# Patient Record
Sex: Female | Born: 1945
Health system: Southern US, Community
[De-identification: ages and names within clinical notes are randomized; demographics above are authoritative.]

## PROBLEM LIST (undated history)

## (undated) DIAGNOSIS — I10 Essential (primary) hypertension: Secondary | ICD-10-CM

## (undated) DIAGNOSIS — L98499 Non-pressure chronic ulcer of skin of other sites with unspecified severity: Secondary | ICD-10-CM

## (undated) DIAGNOSIS — K219 Gastro-esophageal reflux disease without esophagitis: Secondary | ICD-10-CM

## (undated) DIAGNOSIS — Z8249 Family history of ischemic heart disease and other diseases of the circulatory system: Secondary | ICD-10-CM

## (undated) DIAGNOSIS — Z8489 Family history of other specified conditions: Secondary | ICD-10-CM

## (undated) DIAGNOSIS — IMO0001 Reserved for inherently not codable concepts without codable children: Secondary | ICD-10-CM

## (undated) DIAGNOSIS — A4902 Methicillin resistant Staphylococcus aureus infection, unspecified site: Secondary | ICD-10-CM

## (undated) DIAGNOSIS — E785 Hyperlipidemia, unspecified: Secondary | ICD-10-CM

## (undated) DIAGNOSIS — M069 Rheumatoid arthritis, unspecified: Secondary | ICD-10-CM

## (undated) HISTORY — DX: Gastro-esophageal reflux disease without esophagitis: K21.9

## (undated) HISTORY — DX: Non-pressure chronic ulcer of skin of other sites with unspecified severity: L98.499

## (undated) HISTORY — DX: Rheumatoid arthritis, unspecified: M06.9

## (undated) HISTORY — DX: Essential (primary) hypertension: I10

## (undated) HISTORY — DX: Methicillin resistant Staphylococcus aureus infection, unspecified site: A49.02

## (undated) HISTORY — DX: Hyperlipidemia, unspecified: E78.5

## (undated) HISTORY — DX: Reserved for inherently not codable concepts without codable children: IMO0001

## (undated) HISTORY — PX: TUBAL LIGATION: SHX77

---

## 1999-12-31 HISTORY — PX: OTHER SURGICAL HISTORY: SHX169

## 2005-10-11 ENCOUNTER — Ambulatory Visit: Payer: Self-pay | Admitting: Internal Medicine

## 2005-11-12 ENCOUNTER — Ambulatory Visit: Payer: Self-pay | Admitting: Internal Medicine

## 2006-05-07 ENCOUNTER — Ambulatory Visit: Payer: Self-pay | Admitting: Pediatrics

## 2006-09-18 ENCOUNTER — Ambulatory Visit: Payer: Self-pay | Admitting: Unknown Physician Specialty

## 2007-07-02 ENCOUNTER — Ambulatory Visit: Payer: Self-pay | Admitting: Internal Medicine

## 2007-07-07 ENCOUNTER — Ambulatory Visit: Payer: Self-pay | Admitting: Internal Medicine

## 2007-10-08 ENCOUNTER — Ambulatory Visit: Payer: Self-pay | Admitting: Unknown Physician Specialty

## 2007-12-15 ENCOUNTER — Ambulatory Visit: Payer: Self-pay | Admitting: Unknown Physician Specialty

## 2008-07-13 ENCOUNTER — Ambulatory Visit: Payer: Self-pay | Admitting: Internal Medicine

## 2008-08-25 ENCOUNTER — Ambulatory Visit: Payer: Self-pay | Admitting: Internal Medicine

## 2008-12-30 HISTORY — PX: CHOLECYSTECTOMY: SHX55

## 2009-06-30 ENCOUNTER — Ambulatory Visit: Payer: Self-pay | Admitting: Internal Medicine

## 2009-07-27 ENCOUNTER — Ambulatory Visit: Payer: Self-pay | Admitting: Surgery

## 2009-08-01 ENCOUNTER — Ambulatory Visit: Payer: Self-pay | Admitting: Internal Medicine

## 2009-08-03 ENCOUNTER — Ambulatory Visit: Payer: Self-pay | Admitting: Surgery

## 2009-08-31 ENCOUNTER — Ambulatory Visit: Payer: Self-pay | Admitting: Internal Medicine

## 2010-07-09 ENCOUNTER — Ambulatory Visit: Payer: Self-pay | Admitting: Rheumatology

## 2010-11-13 ENCOUNTER — Ambulatory Visit: Payer: Self-pay | Admitting: Internal Medicine

## 2011-02-12 ENCOUNTER — Ambulatory Visit: Payer: Self-pay | Admitting: Internal Medicine

## 2012-03-12 ENCOUNTER — Ambulatory Visit: Payer: Self-pay | Admitting: Internal Medicine

## 2012-07-07 ENCOUNTER — Ambulatory Visit: Payer: Self-pay | Admitting: Internal Medicine

## 2012-10-06 ENCOUNTER — Telehealth: Payer: Self-pay | Admitting: Internal Medicine

## 2012-10-06 ENCOUNTER — Emergency Department: Payer: Self-pay | Admitting: Emergency Medicine

## 2012-10-06 NOTE — Telephone Encounter (Signed)
I can see her at 11:30 Friday for er follow up.  Have her bring in her meds with her.  Also would like to have er records prior to her visit.  thanks

## 2012-10-06 NOTE — Telephone Encounter (Signed)
Caller: [name]/Patient; Patient Name: Erica Little; PCP: [facility] [place]; Best Callback Phone Number: [phone]  Patient states she developed pain, redness and tenderness in calf of right leg. Onset 10/04/12. Afebrile. States pain eased 10/05/12 a.m. but increased 10/05/12 p.m. States calf is swollen and feels "tight." States area is "hot" to touch.  States reddened area is approximately 5 inches in length. Patient describes positive Homan's sign. States pain increases with standing. Triage per Leg Non-Injury Protocol. Care advice given per guidelines related to positive triage assessment for " Recent onset of one-sided pain, tenderness, or aching that may worsen with standing or walking." Patient advised to be evaluated in the Emergency Department now. Patient advised to elevate leg. Advised not to drive self. Patient verbalizes understanding and agreeable. States will go to [facility] for evaluation.

## 2012-10-06 NOTE — Telephone Encounter (Signed)
Patient did state that Triage couldn't find her in our system. I have added her to our system.

## 2012-10-06 NOTE — Telephone Encounter (Signed)
Patient spoke with our triage and was advised to go to ER, which patient did and they would like you to see her Thursday or Friday they dx her with cellulitis. Please advise.

## 2012-10-06 NOTE — Telephone Encounter (Signed)
Agree with evaluation now.   

## 2012-10-07 NOTE — Telephone Encounter (Signed)
Appointment 10/11 @ 11:30 pt aware of appointment

## 2012-10-09 ENCOUNTER — Encounter: Payer: Self-pay | Admitting: Internal Medicine

## 2012-10-09 ENCOUNTER — Ambulatory Visit (INDEPENDENT_AMBULATORY_CARE_PROVIDER_SITE_OTHER): Payer: No Typology Code available for payment source | Admitting: Internal Medicine

## 2012-10-09 VITALS — BP 142/80 | HR 77 | Temp 98.3°F | Ht 61.5 in | Wt 118.0 lb

## 2012-10-09 DIAGNOSIS — E7800 Pure hypercholesterolemia, unspecified: Secondary | ICD-10-CM

## 2012-10-09 DIAGNOSIS — I10 Essential (primary) hypertension: Secondary | ICD-10-CM

## 2012-10-09 DIAGNOSIS — E871 Hypo-osmolality and hyponatremia: Secondary | ICD-10-CM

## 2012-10-09 DIAGNOSIS — L0291 Cutaneous abscess, unspecified: Secondary | ICD-10-CM

## 2012-10-09 DIAGNOSIS — L0390 Cellulitis, unspecified: Secondary | ICD-10-CM

## 2012-10-09 NOTE — Patient Instructions (Signed)
It was nice seeing you today.  I am sorry you have had problems with your leg.  Since it is so much better, I want you to continue taking the antibiotics as you have been doing.  Let me know if any problems or if the leg infection does not completely resolve.

## 2012-10-11 ENCOUNTER — Encounter: Payer: Self-pay | Admitting: Internal Medicine

## 2012-10-11 DIAGNOSIS — E7800 Pure hypercholesterolemia, unspecified: Secondary | ICD-10-CM | POA: Insufficient documentation

## 2012-10-11 DIAGNOSIS — E871 Hypo-osmolality and hyponatremia: Secondary | ICD-10-CM | POA: Insufficient documentation

## 2012-10-11 DIAGNOSIS — I10 Essential (primary) hypertension: Secondary | ICD-10-CM | POA: Insufficient documentation

## 2012-10-11 NOTE — Assessment & Plan Note (Addendum)
Sodium has been wnl on most recent checks.  Follow.

## 2012-10-11 NOTE — Assessment & Plan Note (Signed)
Has been under good control.  Is a little elevated today.  Follow.

## 2012-10-11 NOTE — Progress Notes (Signed)
  Subjective:    Patient ID: Erica Little, female    DOB: [birth year], 65 y.o.   MRN: [id number]  HPI 66 year old female with past history of rheumatoid arthritis (on Orencia), hypertension and hypercholesterolemia.  She comes in today as an ER follow up.  Developed right knee pain and swelling initially (five days ago).  This progressed to involve more of her lower leg.  Increased pain and erythema - down her right leg.  She went to the ER 10/06/12.  Ultrasound of her right lower leg - negative for DVT.  Was diagnosed with cellulitis.  Placed on Keflex.  Leg is improved.  Pain and redness much better.    Past Medical History  Diagnosis Date  . Rheumatoid arthritis   . Ulcer disease   . Hypertension   . Hyperlipidemia     Review of Systems Patient denies any headache, lightheadedness or dizziness.  No fever or chills.  No chest pain, tightness or palpatations.  No increased shortness of breath, cough or congestion.  No nausea or vomiting.  No abdominal pain or cramping.  No bowel change, such as diarrhea, constipation, BRBPR or melana.  No urine change.      Objective:   Physical Exam Filed Vitals:   10/09/12 1128  BP: 142/80  Pulse: 77  Temp: 98.3 F (52.47 C)   66 year old female in no acute distress.   HEENT:  Nares - clear.  OP- without lesions or erythema.  NECK:  Supple, nontender.   HEART:  Appears to be regular. LUNGS:  Without crackles or wheezing audible.  Respirations even and unlabored.  ABDOMEN:  Soft, nontender.  No audible abdominal bruit.   EXTREMITIES:  No significant erythema.  Minimal tenderness just below the right knee.  Minimal soft tissue swelling just below the knee - no significant swelling.  (Per pt - much improved).                   Assessment & Plan:  CELLULITIS.  Improved on Keflex.  Complete course of Keflex.  Follow.   LYMPHADENOPATHY.  There were several hypoechoic masses noted on ultrasound.  (Felt to be consistent with lymph nodes).  Has the  infection as outlined.  Minimal inguinal lymphadenopathy noted on today's exam.  Follow.  Should resolve with treating the above infection.

## 2012-10-11 NOTE — Assessment & Plan Note (Signed)
Low cholesterol diet and exercise.  Follow lipid profile.    

## 2012-10-28 ENCOUNTER — Telehealth: Payer: Self-pay | Admitting: Internal Medicine

## 2012-10-28 NOTE — Telephone Encounter (Signed)
See if she can come in tomorrow at 10:00 - work in for this problem.  Thanks.

## 2012-10-28 NOTE — Telephone Encounter (Signed)
Caller: [name]/Patient; Patient Name: Erica Little; PCP: Dale [place]; Best Callback Phone Number: [phone].  Patient calling about cough.  Onset 10/23/12.  States has been using her inhaler, but states she has not been sleeping well.  Cough is not productive.  Afebrile.  Per cough protocol, emergent symptoms denied; advised appt within 24 hours.  Per Epic, no appts available within dispositioned time frame; info to office for staff management/workin appt.   May reach patient at [phone].

## 2012-10-29 ENCOUNTER — Ambulatory Visit (INDEPENDENT_AMBULATORY_CARE_PROVIDER_SITE_OTHER): Payer: No Typology Code available for payment source | Admitting: Internal Medicine

## 2012-10-29 ENCOUNTER — Encounter: Payer: Self-pay | Admitting: Internal Medicine

## 2012-10-29 VITALS — BP 167/81 | HR 80 | Temp 98.9°F | Ht 60.0 in | Wt 116.8 lb

## 2012-10-29 DIAGNOSIS — J069 Acute upper respiratory infection, unspecified: Secondary | ICD-10-CM

## 2012-10-29 DIAGNOSIS — I10 Essential (primary) hypertension: Secondary | ICD-10-CM

## 2012-10-29 MED ORDER — AZITHROMYCIN 250 MG PO TABS: ORAL_TABLET | ORAL | Status: DC

## 2012-10-29 MED ORDER — FLUTICASONE PROPIONATE 50 MCG/ACT NA SUSP: 2.0000 | Freq: Every day | NASAL | Status: DC

## 2012-10-29 MED ORDER — AMLODIPINE BESYLATE 2.5 MG PO TABS: 2.5000 mg | ORAL_TABLET | Freq: Every day | ORAL | Status: DC

## 2012-10-29 MED ORDER — PREDNISONE 10 MG PO TABS: 10.0000 mg | ORAL_TABLET | Freq: Every day | ORAL | Status: DC

## 2012-10-29 NOTE — Patient Instructions (Signed)
It was good to see you today.  I am sorry you are not feeling well.  I am going to give you an antibiotic (Zpak) and prednisone to take as directed.  Let me know if your symptoms do not improve/resolve.  Use saline nasal spray - flush nose at least 2-3x/day and Flonase nasal spray - 2 sprays each nostril one time per day.

## 2012-10-29 NOTE — Telephone Encounter (Signed)
Pt aware of appointment 

## 2012-10-30 ENCOUNTER — Telehealth: Payer: Self-pay | Admitting: Internal Medicine

## 2012-10-30 NOTE — Telephone Encounter (Signed)
With her being sick, I expect her blood pressure to be a little elevated.  Will hold on making changes and continue treatment of her infection - as long as no new acute problems.  She can continue to spot check her blood pressure and call in.  Can check at most daily.   Any problems, let us know.

## 2012-10-30 NOTE — Telephone Encounter (Signed)
Called patient at home to inform her and patient stated that she will let us know if there are any more problems. Patient also stated that her bp has lowered.

## 2012-10-30 NOTE — Telephone Encounter (Signed)
Caller: Nyajah/Patient; Patient Name: Erica Little; PCP: Dale [place]; Best Callback Phone Number: [phone]. Patient states she was seen in the office yesterday 10/29/12 and given Prednison and antibiotic.  Patient reports she was instructed by  Dr Lorin Picket to call back today and report her BP readings.. Patient states BP today 10/30/12 at 10:13 AM 177/82, HR 74;  10:48 AM 163/73, HR 71 and 11:15 AM 165/71, HR 73.  PLEASE F/U WITH PATIENT IF NEEDED MEDICATION CHANGES.  THANK YOU.

## 2012-10-31 NOTE — Assessment & Plan Note (Signed)
Blood pressure elevated.  Have her spot check her pressure and send in readings.  Make adjustments if persistent elevation.  Treat infection.  Should improve back to baseline.

## 2012-10-31 NOTE — Progress Notes (Signed)
  Subjective:    Patient ID: Erica Little, female    DOB: 1946/09/01, 66 y.o.   MRN: [id number]  HPI 66 year old female with past history of hypertension and rheumatoid arthritis who comes in today as a work in with concerns regarding increased congestion and cough.  States symptoms started approximately one week ago.  No sinus congestion and nasal congestion.  Increased throat congestion.  Increased cough and wheezing.  Cough occasionally productive.  No sob.  No vomiting.  No bowel change.  Used the Advair x 1.  Though it made her symptoms worse.  Past Medical History  Diagnosis Date  . Rheumatoid arthritis   . Ulcer disease   . Hypertension   . Hyperlipidemia     Review of Systems Patient denies any headache, lightheadedness or dizziness.  No chest pain, tightness or palpitations.  Does have the increased cough and congestion as outlined.   No nausea or vomiting.  No abdominal pain or cramping.  No bowel change, such as diarrhea, constipation, BRBPR or melana.  No urine change.        Objective:   Physical Exam Filed Vitals:   10/29/12 0958  BP: 167/81  Pulse: 80  Temp: 98.9 F (38.66 C)   66 year old female in no acute distress.   HEENT:  Nares - clear.  OP- without lesions or erythema.  TMs visualized without erythema.  No sinus tenderness to palpation.  NECK:  Supple, nontender.   HEART:  Appears to be regular. LUNGS:  Without crackles or wheezing audible.  Respirations even and unlabored.  Increased cough with forced expiration.                     Assessment & Plan:  URI.  Treat with Azithromycin as directed.  Prednisone taper as outlined starting at 40mg  and decreasing by 5mg  each day until off.  Continue Flonase nasal spray as outlined. Saline nasal spray as directed.  Notify me or be reevaluated if symptoms change, worsen or do not resolve.

## 2012-11-04 ENCOUNTER — Telehealth: Payer: Self-pay | Admitting: *Deleted

## 2012-11-11 NOTE — Telephone Encounter (Signed)
Script for Losartan sent to pharmacy

## 2012-12-17 ENCOUNTER — Encounter: Payer: Self-pay | Admitting: Internal Medicine

## 2012-12-17 ENCOUNTER — Ambulatory Visit: Payer: Self-pay | Admitting: Internal Medicine

## 2012-12-17 DIAGNOSIS — M069 Rheumatoid arthritis, unspecified: Secondary | ICD-10-CM

## 2013-01-06 ENCOUNTER — Ambulatory Visit (INDEPENDENT_AMBULATORY_CARE_PROVIDER_SITE_OTHER): Payer: Medicare Other | Admitting: Internal Medicine

## 2013-01-06 ENCOUNTER — Other Ambulatory Visit (HOSPITAL_COMMUNITY)
Admission: RE | Admit: 2013-01-06 | Discharge: 2013-01-06 | Disposition: A | Discharge status: Home / Self Care | Payer: Medicare Other | Source: Ambulatory Visit | Attending: Internal Medicine | Admitting: Internal Medicine

## 2013-01-06 ENCOUNTER — Encounter: Payer: Self-pay | Admitting: Internal Medicine

## 2013-01-06 VITALS — BP 112/78 | HR 75 | Temp 98.4°F | Ht 61.5 in

## 2013-01-06 DIAGNOSIS — I10 Essential (primary) hypertension: Secondary | ICD-10-CM

## 2013-01-06 DIAGNOSIS — Z01419 Encounter for gynecological examination (general) (routine) without abnormal findings: Secondary | ICD-10-CM | POA: Insufficient documentation

## 2013-01-06 DIAGNOSIS — M8580 Other specified disorders of bone density and structure, unspecified site: Secondary | ICD-10-CM

## 2013-01-06 DIAGNOSIS — M069 Rheumatoid arthritis, unspecified: Secondary | ICD-10-CM

## 2013-01-06 DIAGNOSIS — E7800 Pure hypercholesterolemia, unspecified: Secondary | ICD-10-CM

## 2013-01-06 DIAGNOSIS — Z1151 Encounter for screening for human papillomavirus (HPV): Secondary | ICD-10-CM | POA: Insufficient documentation

## 2013-01-06 DIAGNOSIS — R5383 Other fatigue: Secondary | ICD-10-CM

## 2013-01-06 DIAGNOSIS — R0989 Other specified symptoms and signs involving the circulatory and respiratory systems: Secondary | ICD-10-CM

## 2013-01-06 DIAGNOSIS — E871 Hypo-osmolality and hyponatremia: Secondary | ICD-10-CM

## 2013-01-06 DIAGNOSIS — Z139 Encounter for screening, unspecified: Secondary | ICD-10-CM

## 2013-01-06 DIAGNOSIS — R5381 Other malaise: Secondary | ICD-10-CM

## 2013-01-06 DIAGNOSIS — M899 Disorder of bone, unspecified: Secondary | ICD-10-CM

## 2013-01-06 DIAGNOSIS — Z01818 Encounter for other preprocedural examination: Secondary | ICD-10-CM

## 2013-01-11 ENCOUNTER — Encounter: Payer: Self-pay | Admitting: Internal Medicine

## 2013-01-11 DIAGNOSIS — M8580 Other specified disorders of bone density and structure, unspecified site: Secondary | ICD-10-CM | POA: Insufficient documentation

## 2013-01-11 NOTE — Assessment & Plan Note (Signed)
Recheck electrolytes to confirm stable.   

## 2013-01-11 NOTE — Assessment & Plan Note (Signed)
Followed by Dr Kernodle.  Stable.  

## 2013-01-11 NOTE — Assessment & Plan Note (Signed)
On pravastatin.  Low cholesterol diet.  Check lipid panel and liver function.   

## 2013-01-11 NOTE — Assessment & Plan Note (Signed)
See above for bone density.  Continue calcium and vitamin D.

## 2013-01-11 NOTE — Progress Notes (Signed)
Subjective:    Patient ID: Erica Little, female    DOB: [birth year], 67 y.o.   MRN: [id number]  HPI 67 year old female with past history of rheumatoid arthritis (on Orencia), hypertension and hypercholesterolemia.  She comes in today to follow up on these issues as well as for a complete physical exam.  States she is doing well.  Denies any chest pain, tightness or sob.  She is to be evaluated at [facility] and is planning for surgery 01/29/13.  No cough and congestion.  Eating and drinking well.  Bowels stable.  Still seeing Dr Gavin Potters for her arthritis.   Past Medical History  Diagnosis Date  . Rheumatoid arthritis     transaminitis on MTX, remicade rxn, s/p sulfasalazine, orencia  . Ulcer disease   . Hypertension   . Hyperlipidemia   . MRSA (methicillin resistant Staphylococcus aureus)     skin infections  . GERD (gastroesophageal reflux disease)     Current Outpatient Prescriptions on File Prior to Visit  Medication Sig Dispense Refill  . abatacept (ORENCIA) 250 MG injection Once every month      . amLODipine (NORVASC) 2.5 MG tablet Take 1 tablet (2.5 mg total) by mouth daily.  30 tablet  12  . calcium gluconate 650 MG tablet Take 650 mg by mouth daily.      . cholecalciferol (VITAMIN D) 1000 UNITS tablet Take 1,000 Units by mouth daily.      Marland Kitchen etodolac (LODINE) 400 MG tablet Take 400 mg by mouth 2 (two) times daily.      . fluticasone (FLONASE) 50 MCG/ACT nasal spray Place 2 sprays into the nose daily.  16 g  2  . losartan (COZAAR) 50 MG tablet Take 50 mg by mouth daily.      . Multiple Vitamin (MULTIVITAMIN) tablet Take 1 tablet by mouth daily.      Marland Kitchen omeprazole (PRILOSEC) 20 MG capsule Take 20 mg by mouth daily.      . predniSONE (DELTASONE) 10 MG tablet Take 1 tablet (10 mg total) by mouth daily. Take 4 pills x one day and then decrease by 1/2 tablet per day until down to zero mg.  18 tablet  0    Review of Systems Patient denies any headache, lightheadedness or  dizziness.  No fever or chills.  No chest pain, tightness or palpitations.  No increased shortness of breath, cough or congestion.  No nausea or vomiting.  No abdominal pain or cramping.  No bowel change, such as diarrhea, constipation, BRBPR or melana.  No urine change.  Overall she feels she is doing well.      Objective:   Physical Exam  Filed Vitals:   01/06/13 1028  BP: 112/78  Pulse: 75  Temp: 98.4 F (28.72 C)   67 year old female in no acute distress.   HEENT:  Nares- clear.  Oropharynx - without lesions. NECK:  Supple.  Nontender.  Audible carotid bruit.  HEART:  Appears to be regular. LUNGS:  No crackles or wheezing audible.  Respirations even and unlabored.  RADIAL PULSE:  Equal bilaterally.    BREASTS:  No nipple discharge or nipple retraction present.  Could not appreciate any distinct nodules or axillary adenopathy.  ABDOMEN:  Soft, nontender.  Bowel sounds present and normal.  No audible abdominal bruit.  GU:  Normal external genitalia.  Vaginal vault without lesions.  Cervix identified.  No lesions.  Could not appreciate any adnexal masses or tenderness.   RECTAL:  Heme negative.   EXTREMITIES:  No increased edema present.  DP pulses palpable and equal bilaterally.  No inguinal lymphadenopathy.         Assessment & Plan:  PRE OP.  Planning for pre op 01/21/13.  EKG obtained today and revealed sinus rhythm with no acute ischemic changes.  She is currently asymptomatic.  Denies any chest pain or tightness.  No sob.  Breathing stable.  No cough or congestion.  Recommend close intra op and post op monitoring of heart rate and blood pressure to avoid extremes.  Feel she is at low risk from a cardiac standpoint to proceed with surgery.    LYMPHADENOPATHY.  There were several hypoechoic masses noted on ultrasound.  (Felt to be consistent with lymph nodes).  Had the infection as outlined in previous note.  Minimal inguinal lymphadenopathy noted previously, but cannot appreciate on  exam today.  Follow.  Have ask for review of the ultrasound to determine if any further w/up warranted.   CAROTID BRUIT.  Check carotid ultrasound to confirm no significant stenosis.   INCREASED PSYCHOSOCIAL STRESSORS.  Feels she is doing better.  Eating well.  Follow.    PREVIOUS WEIGHT LOSS.  Weight has leveled off.  116 last visit.  117 today.  Follow.   HEALTH MAINTENANCE.  Physical today.  Bone density 10//12/11 revealed osteopenia.  colonoscopy 09/18/06.  Was due 9/12.  Discussed with her today regarding need for follow up colonoscopy.  She will consider this after her surgery.  Mammogram 03/12/12 - BiRADS I.   IFOB - given today.

## 2013-01-11 NOTE — Assessment & Plan Note (Signed)
Blood pressure under good control.  Will need close intra op and post op monitoring of her heart rate and blood pressure to avoid extremes.  Same medication regimen.  Check metabolic panel.

## 2013-01-13 ENCOUNTER — Telehealth: Payer: Self-pay | Admitting: Internal Medicine

## 2013-01-13 ENCOUNTER — Other Ambulatory Visit: Payer: Medicare Other

## 2013-01-13 DIAGNOSIS — IMO0002 Reserved for concepts with insufficient information to code with codable children: Secondary | ICD-10-CM

## 2013-01-13 NOTE — Telephone Encounter (Signed)
Pt notified of abnormal pap.  Referred to gyn for eval.

## 2013-01-14 ENCOUNTER — Other Ambulatory Visit (INDEPENDENT_AMBULATORY_CARE_PROVIDER_SITE_OTHER): Payer: Medicare Other

## 2013-01-14 DIAGNOSIS — R5383 Other fatigue: Secondary | ICD-10-CM

## 2013-01-14 DIAGNOSIS — I10 Essential (primary) hypertension: Secondary | ICD-10-CM

## 2013-01-14 DIAGNOSIS — E7800 Pure hypercholesterolemia, unspecified: Secondary | ICD-10-CM

## 2013-01-14 DIAGNOSIS — M069 Rheumatoid arthritis, unspecified: Secondary | ICD-10-CM

## 2013-01-14 DIAGNOSIS — R5381 Other malaise: Secondary | ICD-10-CM

## 2013-01-14 LAB — CBC WITH DIFFERENTIAL/PLATELET
Basophils Relative: 0.6 % (ref 0.0–3.0)
Eosinophils Absolute: 0 10*3/uL (ref 0.0–0.7)
Eosinophils Relative: 0.3 % (ref 0.0–5.0)
Hemoglobin: 12.8 g/dL (ref 12.0–15.0)
MCHC: 32.4 g/dL (ref 30.0–36.0)
MCV: 86.1 fl (ref 78.0–100.0)
Monocytes Absolute: 0.5 10*3/uL (ref 0.1–1.0)
Neutro Abs: 4.2 10*3/uL (ref 1.4–7.7)
Neutrophils Relative %: 65.2 % (ref 43.0–77.0)
RBC: 4.59 Mil/uL (ref 3.87–5.11)
WBC: 6.5 10*3/uL (ref 4.5–10.5)

## 2013-01-14 LAB — BASIC METABOLIC PANEL
BUN: 21 mg/dL (ref 6–23)
Chloride: 103 mEq/L (ref 96–112)
Creatinine, Ser: 1 mg/dL (ref 0.4–1.2)
GFR: 58.59 mL/min — ABNORMAL LOW (ref >60.00)
Glucose, Bld: 89 mg/dL (ref 70–99)
Potassium: 4.5 mEq/L (ref 3.5–5.1)

## 2013-01-14 LAB — HEPATIC FUNCTION PANEL
Alkaline Phosphatase: 76 U/L (ref 39–117)
Bilirubin, Direct: 0.1 mg/dL (ref 0.0–0.3)
Total Bilirubin: 1 mg/dL (ref 0.3–1.2)

## 2013-01-14 LAB — LIPID PANEL
HDL: 41.9 mg/dL (ref >39.00)
VLDL: 37.6 mg/dL (ref 0.0–40.0)

## 2013-01-19 ENCOUNTER — Other Ambulatory Visit: Payer: Self-pay | Admitting: *Deleted

## 2013-01-19 ENCOUNTER — Telehealth: Payer: Self-pay | Admitting: *Deleted

## 2013-01-19 MED ORDER — PRAVASTATIN SODIUM 10 MG PO TABS: 10.0000 mg | ORAL_TABLET | Freq: Every day | ORAL | Status: DC

## 2013-01-19 NOTE — Telephone Encounter (Signed)
Spoke with patient regarding lab work and ordered script.

## 2013-01-20 ENCOUNTER — Telehealth: Payer: Self-pay | Admitting: Internal Medicine

## 2013-01-20 NOTE — Telephone Encounter (Signed)
I am not sure where the form is we can fax this information over once we are told where the completed form is. Thanks

## 2013-01-20 NOTE — Telephone Encounter (Signed)
I have already completed and amber faxed the information this pm.  Thanks.

## 2013-01-20 NOTE — Telephone Encounter (Signed)
Inetta Fermo calling from [facility] [phone] extension [phone] Pt has preop 1/23 morning.  Tina faxed surgical clearance form 1/21 for Dr. Lorin Picket and she needs it back for pt preop 1/23 morning.  Please fax completed form, most recent office notes, labs and EKG to [phone]

## 2013-01-26 ENCOUNTER — Other Ambulatory Visit: Payer: Self-pay | Admitting: Internal Medicine

## 2013-01-26 MED ORDER — OMEPRAZOLE 20 MG PO CPDR: 20.0000 mg | DELAYED_RELEASE_CAPSULE | Freq: Every day | ORAL | Status: DC

## 2013-01-26 NOTE — Telephone Encounter (Signed)
Sent in to pharmacy.  

## 2013-03-03 ENCOUNTER — Encounter: Payer: Self-pay | Admitting: Internal Medicine

## 2013-04-14 ENCOUNTER — Ambulatory Visit: Payer: Self-pay | Admitting: Internal Medicine

## 2013-04-19 ENCOUNTER — Other Ambulatory Visit: Payer: Self-pay | Admitting: Internal Medicine

## 2013-04-19 DIAGNOSIS — R928 Other abnormal and inconclusive findings on diagnostic imaging of breast: Secondary | ICD-10-CM

## 2013-04-22 ENCOUNTER — Ambulatory Visit
Admission: RE | Admit: 2013-04-22 | Discharge: 2013-04-22 | Disposition: A | Discharge status: Home / Self Care | Payer: Medicare Other | Source: Ambulatory Visit | Attending: Internal Medicine | Admitting: Internal Medicine

## 2013-04-22 ENCOUNTER — Other Ambulatory Visit: Payer: Self-pay | Admitting: Internal Medicine

## 2013-04-22 DIAGNOSIS — R928 Other abnormal and inconclusive findings on diagnostic imaging of breast: Secondary | ICD-10-CM

## 2013-04-23 ENCOUNTER — Encounter: Payer: Self-pay | Admitting: Internal Medicine

## 2013-04-26 ENCOUNTER — Other Ambulatory Visit: Payer: Self-pay | Admitting: *Deleted

## 2013-04-26 MED ORDER — LOSARTAN POTASSIUM 100 MG PO TABS: ORAL_TABLET | ORAL | Status: DC

## 2013-05-11 ENCOUNTER — Encounter: Payer: Self-pay | Admitting: Internal Medicine

## 2013-05-11 ENCOUNTER — Ambulatory Visit (INDEPENDENT_AMBULATORY_CARE_PROVIDER_SITE_OTHER): Payer: Medicare Other | Admitting: Internal Medicine

## 2013-05-11 VITALS — BP 122/78 | Temp 98.5°F | Resp 85 | Ht 61.5 in | Wt 115.4 lb

## 2013-05-11 DIAGNOSIS — M069 Rheumatoid arthritis, unspecified: Secondary | ICD-10-CM

## 2013-05-11 DIAGNOSIS — M8580 Other specified disorders of bone density and structure, unspecified site: Secondary | ICD-10-CM

## 2013-05-11 DIAGNOSIS — M899 Disorder of bone, unspecified: Secondary | ICD-10-CM

## 2013-05-11 DIAGNOSIS — E7800 Pure hypercholesterolemia, unspecified: Secondary | ICD-10-CM

## 2013-05-11 DIAGNOSIS — E871 Hypo-osmolality and hyponatremia: Secondary | ICD-10-CM

## 2013-05-11 DIAGNOSIS — I10 Essential (primary) hypertension: Secondary | ICD-10-CM

## 2013-05-11 LAB — LIPID PANEL
Total CHOL/HDL Ratio: 6
VLDL: 35.6 mg/dL (ref 0.0–40.0)

## 2013-05-11 LAB — CBC WITH DIFFERENTIAL/PLATELET
Basophils Absolute: 0 10*3/uL (ref 0.0–0.1)
Eosinophils Relative: 1.3 % (ref 0.0–5.0)
HCT: 37.8 % (ref 36.0–46.0)
Hemoglobin: 12.9 g/dL (ref 12.0–15.0)
Lymphocytes Relative: 25.9 % (ref 12.0–46.0)
Lymphs Abs: 1.9 10*3/uL (ref 0.7–4.0)
Monocytes Relative: 8.3 % (ref 3.0–12.0)
Neutro Abs: 4.7 10*3/uL (ref 1.4–7.7)
RBC: 4.59 Mil/uL (ref 3.87–5.11)
RDW: 15.2 % — ABNORMAL HIGH (ref 11.5–14.6)
WBC: 7.3 10*3/uL (ref 4.5–10.5)

## 2013-05-11 LAB — BASIC METABOLIC PANEL
BUN: 21 mg/dL (ref 6–23)
CO2: 23 mEq/L (ref 19–32)
Chloride: 103 mEq/L (ref 96–112)
Creatinine, Ser: 1.1 mg/dL (ref 0.4–1.2)
Glucose, Bld: 82 mg/dL (ref 70–99)
Potassium: 4.3 mEq/L (ref 3.5–5.1)

## 2013-05-11 LAB — LDL CHOLESTEROL, DIRECT: Direct LDL: 156.2 mg/dL

## 2013-05-11 LAB — HEPATIC FUNCTION PANEL
Albumin: 3.7 g/dL (ref 3.5–5.2)
Alkaline Phosphatase: 123 U/L — ABNORMAL HIGH (ref 39–117)
Bilirubin, Direct: 0.1 mg/dL (ref 0.0–0.3)
Total Bilirubin: 0.8 mg/dL (ref 0.3–1.2)

## 2013-05-11 NOTE — Progress Notes (Signed)
Subjective:    Patient ID: Erica Little, female    DOB: 1946-03-04, 67 y.o.   MRN: [id number]  HPI 67 year old female with past history of rheumatoid arthritis (on Orencia), hypertension and hypercholesterolemia.  She comes in today to follow up on these issues as well as for a pre op evaluation.  States she is doing well.  Denies any chest pain, tightness or sob.  She is being evaluated at [facility] and is planning for surgery 06/04/13.  No cough and congestion.  Eating and drinking well.  Bowels stable.  Still seeing Dr Gavin Potters for her arthritis.    Past Medical History  Diagnosis Date  . Rheumatoid arthritis     transaminitis on MTX, remicade rxn, s/p sulfasalazine, orencia  . Ulcer disease   . Hypertension   . Hyperlipidemia   . MRSA (methicillin resistant Staphylococcus aureus)     skin infections  . GERD (gastroesophageal reflux disease)     Current Outpatient Prescriptions on File Prior to Visit  Medication Sig Dispense Refill  . abatacept (ORENCIA) 250 MG injection Once every month      . amLODipine (NORVASC) 2.5 MG tablet Take 1 tablet (2.5 mg total) by mouth daily.  30 tablet  12  . calcium gluconate 650 MG tablet Take 650 mg by mouth daily.      . cholecalciferol (VITAMIN D) 1000 UNITS tablet Take 1,000 Units by mouth daily.      Marland Kitchen etodolac (LODINE) 400 MG tablet Take 400 mg by mouth 2 (two) times daily.      . fluticasone (FLONASE) 50 MCG/ACT nasal spray Place 2 sprays into the nose daily.  16 g  2  . losartan (COZAAR) 100 MG tablet TAKE 1 TABLET ONCE A DAY  30 tablet  5  . Multiple Vitamin (MULTIVITAMIN) tablet Take 1 tablet by mouth daily.      Marland Kitchen omeprazole (PRILOSEC) 20 MG capsule Take 1 capsule (20 mg total) by mouth daily.  30 capsule  5  . pravastatin (PRAVACHOL) 10 MG tablet Take 1 tablet (10 mg total) by mouth daily.  90 tablet  3   No current facility-administered medications on file prior to visit.    Review of Systems Patient denies any headache,  lightheadedness or dizziness.  No fever or chills.  No chest pain, tightness or palpitations.  No increased shortness of breath, cough or congestion.  No nausea or vomiting.  No abdominal pain or cramping.  No bowel change, such as diarrhea, constipation, BRBPR or melana.  No urine change.  Overall she feels she is doing well.   She has been off her pravastatin.  Reports dry mouth with pravastatin.  Agrees to try pravastatin 1/2 tablet q day.     Objective:   Physical Exam  Filed Vitals:   05/11/13 1010  BP: 122/78  Temp: 98.5 F (36.9 C)  Resp: 85   Pulse 34  67 year old female in no acute distress.   HEENT:  Nares- clear.  Oropharynx - without lesions. NECK:  Supple.  Nontender.  Audible carotid bruit.  HEART:  Appears to be regular. LUNGS:  No crackles or wheezing audible.  Respirations even and unlabored.  RADIAL PULSE:  Equal bilaterally.  ABDOMEN:  Soft, nontender.  Bowel sounds present and normal.  No audible abdominal bruit.  EXTREMITIES:  No increased edema present.  DP pulses palpable and equal bilaterally.  No inguinal lymphadenopathy.         Assessment & Plan:  PRE OP.   EKG performed recently and revealed sinus rhythm with no acute ischemic changes.  She is currently asymptomatic.  Denies any chest pain or tightness.  No sob.  Breathing stable.  No cough or congestion.  Recent surgery - she did well.   I feel that she is at low risk from a cardiac standpoint to proceed with surgery.  Recommend close intra op and post op monitoring of heart rate and blood pressure to avoid extremes.  Feel she is at low risk from a cardiac standpoint to proceed with surgery.    LYMPHADENOPATHY.  There were several hypoechoic masses noted on ultrasound.  (Felt to be consistent with lymph nodes).  Had the infection as outlined in previous note.  Minimal inguinal lymphadenopathy noted previously, but cannot appreciate on exam today.  Follow.  Have ask for review of the ultrasound to determine if  any further w/up warranted.   CAROTID BRUIT.  Carotid ultrasound revealed no significant stenosis.   INCREASED PSYCHOSOCIAL STRESSORS.  Feels she is doing better.  Eating well.  Follow.    PREVIOUS WEIGHT LOSS.  Weight has leveled off.  Follow.   HEALTH MAINTENANCE.  Physical last visit.  Bone density 10//12/11 revealed osteopenia.  colonoscopy 09/18/06.  Was due 9/12.  Discussed with her today regarding need for follow up colonoscopy.  She will consider this after her surgery.  Mammogram 04/22/13 - ok.

## 2013-05-13 ENCOUNTER — Encounter: Payer: Self-pay | Admitting: *Deleted

## 2013-05-14 ENCOUNTER — Encounter: Payer: Self-pay | Admitting: Internal Medicine

## 2013-05-14 NOTE — Assessment & Plan Note (Signed)
Blood pressure under good control.  Will need close intra op and post op monitoring of her heart rate and blood pressure to avoid extremes.  Same medication regimen.  Check metabolic panel.

## 2013-05-14 NOTE — Assessment & Plan Note (Signed)
See above for bone density.  Continue calcium and vitamin D.

## 2013-05-14 NOTE — Assessment & Plan Note (Signed)
Followed by Dr Kernodle.  Stable.  

## 2013-05-14 NOTE — Assessment & Plan Note (Signed)
On pravastatin.  Low cholesterol diet.  Check lipid panel and liver function.   

## 2013-05-14 NOTE — Assessment & Plan Note (Signed)
Recheck electrolytes to confirm stable.   

## 2013-07-22 ENCOUNTER — Other Ambulatory Visit: Payer: Self-pay | Admitting: Internal Medicine

## 2013-08-23 ENCOUNTER — Encounter: Payer: Self-pay | Admitting: Adult Health

## 2013-08-23 ENCOUNTER — Ambulatory Visit (INDEPENDENT_AMBULATORY_CARE_PROVIDER_SITE_OTHER): Payer: Medicare Other | Admitting: Adult Health

## 2013-08-23 VITALS — BP 120/76 | HR 66 | Temp 98.4°F | Resp 12 | Wt 111.0 lb

## 2013-08-23 DIAGNOSIS — H6121 Impacted cerumen, right ear: Secondary | ICD-10-CM

## 2013-08-23 DIAGNOSIS — H6120 Impacted cerumen, unspecified ear: Secondary | ICD-10-CM

## 2013-08-23 NOTE — Assessment & Plan Note (Signed)
Although patient's c/o the left ear being stopped up, it was her right ear that was completely occluded. I suspect the symptoms on the left were triggered by this occlusion. Irrigation of right ear.

## 2013-08-23 NOTE — Patient Instructions (Addendum)
   To prevent wax buildup within the ear:   Use equal parts of water and white vinegar  Soak a cotton ball in the solution and place several drops within the ear  Insert cotton ball in external ear canal and let sit for 30 minutes prior to shower  Remove cotton ball and gently irrigate the ear canal in the shower.  Do not irrigate directly into the ear but rather let it hit the external canal and  irrigate.  For maintenance, this can be done 1 time weekly. 

## 2013-08-23 NOTE — Progress Notes (Signed)
  Subjective:    Patient ID: Erica Little, female    DOB: 13-Jan-1946, 67 y.o.   MRN: [id number]  HPI  Patient is a very pleasant 67 year old female who presents to clinic feeling that her left ear is stopped up. She reports feeling the symptoms for the past several weeks; however, she just underwent foot surgery and was recovering from that. She has no other symptoms at this time.   Current Outpatient Prescriptions on File Prior to Visit  Medication Sig Dispense Refill  . abatacept (ORENCIA) 250 MG injection Once every month      . amLODipine (NORVASC) 2.5 MG tablet Take 1 tablet (2.5 mg total) by mouth daily.  30 tablet  12  . calcium gluconate 650 MG tablet Take 650 mg by mouth daily.      . cholecalciferol (VITAMIN D) 1000 UNITS tablet Take 1,000 Units by mouth daily.      Marland Kitchen etodolac (LODINE) 400 MG tablet Take 400 mg by mouth 2 (two) times daily.      . fluticasone (FLONASE) 50 MCG/ACT nasal spray Place 2 sprays into the nose daily.  16 g  2  . losartan (COZAAR) 100 MG tablet TAKE 1 TABLET ONCE A DAY  30 tablet  5  . Multiple Vitamin (MULTIVITAMIN) tablet Take 1 tablet by mouth daily.      Marland Kitchen omeprazole (PRILOSEC) 20 MG capsule TAKE ONE CAPSULE BY MOUTH EVERY DAY  30 capsule  11  . pravastatin (PRAVACHOL) 10 MG tablet Take 1 tablet (10 mg total) by mouth daily.  90 tablet  3   No current facility-administered medications on file prior to visit.    Review of Systems  HENT:       Left ear feels stopped up    BP 120/76  Pulse 66  Temp(Src) 98.4 F (36.9 C) (Oral)  Resp 12  Wt 111 lb (50.349 kg)  BMI 20.64 kg/m2  SpO2 94%    Objective:   Physical Exam  HENT:  Head: Normocephalic and atraumatic.  Left Ear: External ear normal.  Right ear completed occluded with cerumen  Eyes: Conjunctivae and EOM are normal. Pupils are equal, round, and reactive to light.  Lymphadenopathy:    She has no cervical adenopathy.          Assessment & Plan:

## 2013-10-05 ENCOUNTER — Ambulatory Visit (INDEPENDENT_AMBULATORY_CARE_PROVIDER_SITE_OTHER): Payer: Medicare Other | Admitting: Internal Medicine

## 2013-10-05 ENCOUNTER — Encounter: Payer: Self-pay | Admitting: Internal Medicine

## 2013-10-05 VITALS — BP 130/80 | HR 73 | Temp 98.1°F | Ht 61.5 in | Wt 111.0 lb

## 2013-10-05 DIAGNOSIS — E871 Hypo-osmolality and hyponatremia: Secondary | ICD-10-CM

## 2013-10-05 DIAGNOSIS — M8580 Other specified disorders of bone density and structure, unspecified site: Secondary | ICD-10-CM

## 2013-10-05 DIAGNOSIS — M069 Rheumatoid arthritis, unspecified: Secondary | ICD-10-CM

## 2013-10-05 DIAGNOSIS — M899 Disorder of bone, unspecified: Secondary | ICD-10-CM

## 2013-10-05 DIAGNOSIS — I10 Essential (primary) hypertension: Secondary | ICD-10-CM

## 2013-10-05 DIAGNOSIS — E7800 Pure hypercholesterolemia, unspecified: Secondary | ICD-10-CM

## 2013-10-05 LAB — BASIC METABOLIC PANEL
CO2: 23 mEq/L (ref 19–32)
Calcium: 9.5 mg/dL (ref 8.4–10.5)
Potassium: 4.2 mEq/L (ref 3.5–5.1)
Sodium: 136 mEq/L (ref 135–145)

## 2013-10-05 LAB — LIPID PANEL
HDL: 42.4 mg/dL (ref >39.00)
Triglycerides: 138 mg/dL (ref 0.0–149.0)
VLDL: 27.6 mg/dL (ref 0.0–40.0)

## 2013-10-05 LAB — HEPATIC FUNCTION PANEL
ALT: 13 U/L (ref 0–35)
AST: 21 U/L (ref 0–37)
Albumin: 3.9 g/dL (ref 3.5–5.2)
Alkaline Phosphatase: 94 U/L (ref 39–117)
Bilirubin, Direct: 0.1 mg/dL (ref 0.0–0.3)
Total Protein: 7.2 g/dL (ref 6.0–8.3)

## 2013-10-10 ENCOUNTER — Encounter: Payer: Self-pay | Admitting: Internal Medicine

## 2013-10-10 NOTE — Assessment & Plan Note (Signed)
Followed by Dr Gavin Potters.  Stable.  Doing better now.

## 2013-10-10 NOTE — Assessment & Plan Note (Signed)
Blood pressure under good control.  Same medication regimen.  Follow metabolic panel.   

## 2013-10-10 NOTE — Assessment & Plan Note (Signed)
See above for bone density.  Continue calcium and vitamin D.

## 2013-10-10 NOTE — Progress Notes (Signed)
Subjective:    Patient ID: Erica Little, female    DOB: 04/23/1946, 67 y.o.   MRN: [id number]  HPI 67 year old female with past history of rheumatoid arthritis (on Orencia), hypertension and hypercholesterolemia.  She comes in today for a scheduled follow up.  States she is doing well.  Denies any chest pain, tightness or sob.   No cough and congestion.  States she is eating and drinking well. Has lost weight.  She states this was secondary to flare of her arthritis.  Had to be off her medication secondary to her foot surgery.  Back on her medication now and doing better.   Bowels stable.  Still seeing Dr Gavin Potters for her arthritis.  Feet are doing better.  Blood pressure doing well.    Past Medical History  Diagnosis Date  . Rheumatoid arthritis(714.0)     transaminitis on MTX, remicade rxn, s/p sulfasalazine, orencia  . Ulcer disease   . Hypertension   . Hyperlipidemia   . MRSA (methicillin resistant Staphylococcus aureus)     skin infections  . GERD (gastroesophageal reflux disease)     Current Outpatient Prescriptions on File Prior to Visit  Medication Sig Dispense Refill  . abatacept (ORENCIA) 250 MG injection Once every month      . amLODipine (NORVASC) 2.5 MG tablet Take 1 tablet (2.5 mg total) by mouth daily.  30 tablet  12  . calcium gluconate 650 MG tablet Take 650 mg by mouth daily.      . cholecalciferol (VITAMIN D) 1000 UNITS tablet Take 1,000 Units by mouth daily.      Marland Kitchen etodolac (LODINE) 400 MG tablet Take 400 mg by mouth daily.       Marland Kitchen losartan (COZAAR) 100 MG tablet TAKE 1 TABLET ONCE A DAY  30 tablet  5  . Multiple Vitamin (MULTIVITAMIN) tablet Take 1 tablet by mouth daily.      Marland Kitchen omeprazole (PRILOSEC) 20 MG capsule TAKE ONE CAPSULE BY MOUTH EVERY DAY  30 capsule  11  . pravastatin (PRAVACHOL) 10 MG tablet Take 1 tablet (10 mg total) by mouth daily.  90 tablet  3   No current facility-administered medications on file prior to visit.    Review of Systems Patient  denies any headache, lightheadedness or dizziness.  No fever or chills.  No chest pain, tightness or palpitations.  No increased shortness of breath, cough or congestion.  No nausea or vomiting.  No abdominal pain or cramping.  No bowel change, such as diarrhea, constipation, BRBPR or melana.  No urine change.  Overall she feels she is doing well.   She has been off her pravastatin.  Reports dry mouth with pravastatin.  Agrees to try pravastatin 1/2 tablet q day.     Objective:   Physical Exam  Filed Vitals:   10/05/13 1107  BP: 130/80  Pulse: 73  Temp: 98.1 F (36.7 C)   Blood pressure recheck:  134/78, pulse 50  67 year old female in no acute distress.   HEENT:  Nares- clear.  Oropharynx - without lesions. NECK:  Supple.  Nontender.  Audible carotid bruit.  HEART:  Appears to be regular. LUNGS:  No crackles or wheezing audible.  Respirations even and unlabored.  RADIAL PULSE:  Equal bilaterally.  ABDOMEN:  Soft, nontender.  Bowel sounds present and normal.  No audible abdominal bruit.  EXTREMITIES:  No increased edema present.  DP pulses palpable and equal bilaterally.  No inguinal lymphadenopathy.  Assessment & Plan:    LYMPHADENOPATHY.  There were several hypoechoic masses noted on ultrasound.  (Felt to be consistent with lymph nodes).  Had the infection as outlined in previous note.  Minimal inguinal lymphadenopathy noted previously, but cannot appreciate on exam today.  Follow.   CAROTID BRUIT.  Carotid ultrasound revealed no significant stenosis.   INCREASED PSYCHOSOCIAL STRESSORS.  Feels she is doing better.  Eating well.  Follow.    PREVIOUS WEIGHT LOSS.  Weight down.  She feels this is related to an arthritis flare.  Back on her medication now and doing better.  Follow.  Get her back in soon to reassess.   HEALTH MAINTENANCE.  Physical 01/06/13.  Bone density 10//12/11 revealed osteopenia.  colonoscopy 09/18/06.  Was due 9/12.  Discussed with her today regarding need  for follow up colonoscopy.  She will let me know when agreeable.   Mammogram 04/22/13 - ok.

## 2013-10-10 NOTE — Assessment & Plan Note (Signed)
Recheck electrolytes to confirm stable.   

## 2013-10-10 NOTE — Assessment & Plan Note (Signed)
On pravastatin.  Low cholesterol diet.  Check lipid panel and liver function.   

## 2013-10-14 ENCOUNTER — Telehealth: Payer: Self-pay | Admitting: Internal Medicine

## 2013-10-14 NOTE — Telephone Encounter (Signed)
States she was called with her lab results but would like to have a copy sent to her by mail to her home to put in her file.

## 2013-10-15 NOTE — Telephone Encounter (Signed)
Lab results mailed as requested.

## 2013-11-01 ENCOUNTER — Other Ambulatory Visit: Payer: Self-pay | Admitting: Internal Medicine

## 2013-11-05 ENCOUNTER — Other Ambulatory Visit: Payer: Self-pay | Admitting: Internal Medicine

## 2013-11-10 ENCOUNTER — Other Ambulatory Visit: Payer: Self-pay | Admitting: Internal Medicine

## 2013-11-10 ENCOUNTER — Telehealth: Payer: Self-pay | Admitting: Internal Medicine

## 2013-11-10 NOTE — Telephone Encounter (Signed)
Phoned Rx in after verifying with pharmacy that they did not receive the electronic submission. Pt notified also

## 2013-11-10 NOTE — Telephone Encounter (Signed)
Pt checking on her med for bp meds [facility] stated they did not receive rx

## 2013-11-22 ENCOUNTER — Other Ambulatory Visit: Payer: Self-pay | Admitting: Internal Medicine

## 2013-12-06 ENCOUNTER — Encounter: Payer: Self-pay | Admitting: Internal Medicine

## 2013-12-06 ENCOUNTER — Ambulatory Visit (INDEPENDENT_AMBULATORY_CARE_PROVIDER_SITE_OTHER): Payer: Medicare Other | Admitting: Internal Medicine

## 2013-12-06 VITALS — BP 118/80 | HR 66 | Temp 98.3°F | Ht 61.5 in | Wt 113.5 lb

## 2013-12-06 DIAGNOSIS — E871 Hypo-osmolality and hyponatremia: Secondary | ICD-10-CM

## 2013-12-06 DIAGNOSIS — R233 Spontaneous ecchymoses: Secondary | ICD-10-CM

## 2013-12-06 DIAGNOSIS — M8580 Other specified disorders of bone density and structure, unspecified site: Secondary | ICD-10-CM

## 2013-12-06 DIAGNOSIS — M79609 Pain in unspecified limb: Secondary | ICD-10-CM

## 2013-12-06 DIAGNOSIS — R238 Other skin changes: Secondary | ICD-10-CM

## 2013-12-06 DIAGNOSIS — M79662 Pain in left lower leg: Secondary | ICD-10-CM

## 2013-12-06 DIAGNOSIS — E7800 Pure hypercholesterolemia, unspecified: Secondary | ICD-10-CM

## 2013-12-06 DIAGNOSIS — I10 Essential (primary) hypertension: Secondary | ICD-10-CM

## 2013-12-06 DIAGNOSIS — M899 Disorder of bone, unspecified: Secondary | ICD-10-CM

## 2013-12-06 DIAGNOSIS — M069 Rheumatoid arthritis, unspecified: Secondary | ICD-10-CM

## 2013-12-06 NOTE — Progress Notes (Signed)
Pre-visit discussion using our clinic review tool. No additional management support is needed unless otherwise documented below in the visit note.  

## 2013-12-07 LAB — PROTIME-INR: INR: 1.1 ratio — ABNORMAL HIGH (ref 0.8–1.0)

## 2013-12-07 LAB — BASIC METABOLIC PANEL
Calcium: 9.3 mg/dL (ref 8.4–10.5)
Chloride: 103 mEq/L (ref 96–112)
GFR: 72.45 mL/min (ref >60.00)
Glucose, Bld: 82 mg/dL (ref 70–99)
Sodium: 134 mEq/L — ABNORMAL LOW (ref 135–145)

## 2013-12-07 LAB — CBC WITH DIFFERENTIAL/PLATELET
Basophils Absolute: 0 10*3/uL (ref 0.0–0.1)
Eosinophils Absolute: 0.3 10*3/uL (ref 0.0–0.7)
Eosinophils Relative: 3.9 % (ref 0.0–5.0)
HCT: 39.3 % (ref 36.0–46.0)
Lymphs Abs: 2.3 10*3/uL (ref 0.7–4.0)
MCHC: 32.5 g/dL (ref 30.0–36.0)
MCV: 86.6 fl (ref 78.0–100.0)
Monocytes Absolute: 0.6 10*3/uL (ref 0.1–1.0)
Platelets: 219 10*3/uL (ref 150.0–400.0)
RDW: 15.9 % — ABNORMAL HIGH (ref 11.5–14.6)
WBC: 7 10*3/uL (ref 4.5–10.5)

## 2013-12-07 LAB — APTT: aPTT: 29.6 s — ABNORMAL HIGH (ref 21.7–28.8)

## 2013-12-08 ENCOUNTER — Other Ambulatory Visit: Payer: Self-pay | Admitting: Internal Medicine

## 2013-12-08 DIAGNOSIS — E871 Hypo-osmolality and hyponatremia: Secondary | ICD-10-CM

## 2013-12-08 DIAGNOSIS — E7800 Pure hypercholesterolemia, unspecified: Secondary | ICD-10-CM

## 2013-12-08 DIAGNOSIS — R791 Abnormal coagulation profile: Secondary | ICD-10-CM

## 2013-12-08 NOTE — Progress Notes (Signed)
Orders placed for f/u labs.  

## 2013-12-11 ENCOUNTER — Encounter: Payer: Self-pay | Admitting: Internal Medicine

## 2013-12-11 DIAGNOSIS — M79662 Pain in left lower leg: Secondary | ICD-10-CM | POA: Insufficient documentation

## 2013-12-11 NOTE — Assessment & Plan Note (Signed)
On pravastatin.  Low cholesterol diet.  Check lipid panel and liver function.   

## 2013-12-11 NOTE — Progress Notes (Signed)
Subjective:    Patient ID: Erica Little, female    DOB: 02-04-1946, 67 y.o.   MRN: [id number]  HPI 67 year old female with past history of rheumatoid arthritis (on Orencia), hypertension and hypercholesterolemia.  She comes in today for a scheduled follow up.  States she is doing well.  Denies any chest pain, tightness or sob.   No cough and congestion.  States she is eating and drinking well.  Has lost weight, but weight is stable from the last check.  Up two pounds from the last check.   Bowels stable.  Still seeing Dr Gavin Potters for her arthritis.   Blood pressure doing well.  She has noticed some increased pain in her left calf and some swelling in her left ankle.  No injury or trauma.  No increased redness.     Past Medical History  Diagnosis Date  . Rheumatoid arthritis(714.0)     transaminitis on MTX, remicade rxn, s/p sulfasalazine, orencia  . Ulcer disease   . Hypertension   . Hyperlipidemia   . MRSA (methicillin resistant Staphylococcus aureus)     skin infections  . GERD (gastroesophageal reflux disease)     Current Outpatient Prescriptions on File Prior to Visit  Medication Sig Dispense Refill  . abatacept (ORENCIA) 250 MG injection Once every month      . amLODipine (NORVASC) 2.5 MG tablet TAKE 1 TABLET (2.5 MG TOTAL) BY MOUTH DAILY.  30 tablet  5  . etodolac (LODINE) 400 MG tablet Take 400 mg by mouth daily.       . fluticasone (FLONASE) 50 MCG/ACT nasal spray Place 2 sprays into the nose daily as needed.      Marland Kitchen losartan (COZAAR) 100 MG tablet TAKE 1 TABLET ONCE A DAY  30 tablet  5  . omeprazole (PRILOSEC) 20 MG capsule TAKE ONE CAPSULE BY MOUTH EVERY DAY  30 capsule  11  . calcium gluconate 650 MG tablet Take 650 mg by mouth daily.      . cholecalciferol (VITAMIN D) 1000 UNITS tablet Take 1,000 Units by mouth daily.      . Multiple Vitamin (MULTIVITAMIN) tablet Take 1 tablet by mouth daily.      . pravastatin (PRAVACHOL) 10 MG tablet Take 1 tablet (10 mg total) by mouth  daily.  90 tablet  3   No current facility-administered medications on file prior to visit.    Review of Systems Patient denies any headache, lightheadedness or dizziness.  No fever or chills.  No chest pain, tightness or palpitations.  No increased shortness of breath, cough or congestion.  No nausea or vomiting.  No abdominal pain or cramping.  No bowel change, such as diarrhea, constipation, BRBPR or melana.  No urine change.  Overall she feels she is doing relatively well.  Left ankle swelling and left calf pain as outlined.  No injury or trauma.    Objective:   Physical Exam  Filed Vitals:   12/06/13 1449  BP: 118/80  Pulse: 66  Temp: 98.3 F (18.57 C)   67 year old female in no acute distress.   HEENT:  Nares- clear.  Oropharynx - without lesions. NECK:  Supple.  Nontender.  Audible carotid bruit.  HEART:  Appears to be regular. LUNGS:  No crackles or wheezing audible.  Respirations even and unlabored.  RADIAL PULSE:  Equal bilaterally.  ABDOMEN:  Soft, nontender.  Bowel sounds present and normal.  No audible abdominal bruit.  EXTREMITIES:  No increased edema present.  She does have some increased soft tissue swelling around her left ankle.  Minimal pain - left calf.  DP pulses palpable and equal bilaterally.  MSK:  No pain to palpation over the ankle.  No pain with rotation of the ankle.          Assessment & Plan:  LYMPHADENOPATHY.  There were several hypoechoic masses noted on ultrasound.  (Felt to be consistent with lymph nodes).  Had the infection as outlined in previous note.  Minimal inguinal lymphadenopathy noted previously, but cannot appreciate on exam today.  Follow.   CAROTID BRUIT.  Carotid ultrasound revealed no significant stenosis.   INCREASED PSYCHOSOCIAL STRESSORS.  Feels she is doing better.  Eating well.  Follow.    PREVIOUS WEIGHT LOSS.  Weight as outlined.  Up a couple of pounds from the last check.  Follow.   HEALTH MAINTENANCE.  Physical 01/06/13.   Bone density 10//12/11 revealed osteopenia.  colonoscopy 09/18/06.  Was due 9/12.  Discussed with her today regarding need for follow up colonoscopy.  She will let me know when agreeable.   Mammogram 04/22/13 - ok.

## 2013-12-11 NOTE — Assessment & Plan Note (Signed)
Pain in the left calf.  Some ankle swelling.  No injury or trauma.  Tylenol as needed.  Sees Dr Gavin Potters in a few days.  Plans to follow up with him regarding the ankle and calf symptoms.  Will hold on xray today.  Follow.

## 2013-12-11 NOTE — Assessment & Plan Note (Signed)
See above for bone density.  Continue vitamin D.

## 2013-12-11 NOTE — Assessment & Plan Note (Signed)
Recheck electrolytes to confirm stable.   

## 2013-12-11 NOTE — Assessment & Plan Note (Signed)
Blood pressure under good control.  Same medication regimen.  Follow metabolic panel.   

## 2013-12-11 NOTE — Assessment & Plan Note (Signed)
Followed by Dr Kernodle.   

## 2013-12-20 ENCOUNTER — Other Ambulatory Visit (INDEPENDENT_AMBULATORY_CARE_PROVIDER_SITE_OTHER): Payer: Medicare Other

## 2013-12-20 ENCOUNTER — Other Ambulatory Visit: Payer: Medicare Other

## 2013-12-20 DIAGNOSIS — E871 Hypo-osmolality and hyponatremia: Secondary | ICD-10-CM

## 2013-12-20 DIAGNOSIS — R791 Abnormal coagulation profile: Secondary | ICD-10-CM

## 2013-12-20 DIAGNOSIS — E7800 Pure hypercholesterolemia, unspecified: Secondary | ICD-10-CM

## 2013-12-20 LAB — LIPID PANEL
Cholesterol: 233 mg/dL — ABNORMAL HIGH (ref 0–200)
Total CHOL/HDL Ratio: 6
Triglycerides: 154 mg/dL — ABNORMAL HIGH (ref 0.0–149.0)
VLDL: 30.8 mg/dL (ref 0.0–40.0)

## 2013-12-20 LAB — LDL CHOLESTEROL, DIRECT: Direct LDL: 167.8 mg/dL

## 2013-12-20 LAB — SODIUM: Sodium: 135 mEq/L (ref 135–145)

## 2013-12-20 LAB — HEPATIC FUNCTION PANEL
AST: 20 U/L (ref 0–37)
Albumin: 4 g/dL (ref 3.5–5.2)
Alkaline Phosphatase: 105 U/L (ref 39–117)
Bilirubin, Direct: 0.1 mg/dL (ref 0.0–0.3)
Total Bilirubin: 0.8 mg/dL (ref 0.3–1.2)

## 2014-03-11 ENCOUNTER — Encounter: Payer: Medicare Other | Admitting: Internal Medicine

## 2014-03-16 ENCOUNTER — Other Ambulatory Visit: Payer: Self-pay

## 2014-03-16 DIAGNOSIS — Z1231 Encounter for screening mammogram for malignant neoplasm of breast: Secondary | ICD-10-CM

## 2014-04-15 ENCOUNTER — Other Ambulatory Visit: Payer: Self-pay | Admitting: Internal Medicine

## 2014-04-22 ENCOUNTER — Other Ambulatory Visit (HOSPITAL_COMMUNITY)
Admission: RE | Admit: 2014-04-22 | Discharge: 2014-04-22 | Disposition: A | Discharge status: Home / Self Care | Payer: Medicare Other | Source: Ambulatory Visit | Attending: Internal Medicine | Admitting: Internal Medicine

## 2014-04-22 ENCOUNTER — Encounter: Payer: Self-pay | Admitting: Internal Medicine

## 2014-04-22 ENCOUNTER — Ambulatory Visit (INDEPENDENT_AMBULATORY_CARE_PROVIDER_SITE_OTHER): Payer: Medicare Other | Admitting: Internal Medicine

## 2014-04-22 VITALS — BP 120/70 | HR 76 | Temp 98.3°F | Ht <= 58 in | Wt 114.0 lb

## 2014-04-22 DIAGNOSIS — M899 Disorder of bone, unspecified: Secondary | ICD-10-CM

## 2014-04-22 DIAGNOSIS — M8580 Other specified disorders of bone density and structure, unspecified site: Secondary | ICD-10-CM

## 2014-04-22 DIAGNOSIS — E7800 Pure hypercholesterolemia, unspecified: Secondary | ICD-10-CM

## 2014-04-22 DIAGNOSIS — M069 Rheumatoid arthritis, unspecified: Secondary | ICD-10-CM

## 2014-04-22 DIAGNOSIS — I10 Essential (primary) hypertension: Secondary | ICD-10-CM

## 2014-04-22 DIAGNOSIS — Z1151 Encounter for screening for human papillomavirus (HPV): Secondary | ICD-10-CM | POA: Insufficient documentation

## 2014-04-22 DIAGNOSIS — E871 Hypo-osmolality and hyponatremia: Secondary | ICD-10-CM

## 2014-04-22 DIAGNOSIS — Z01419 Encounter for gynecological examination (general) (routine) without abnormal findings: Secondary | ICD-10-CM | POA: Insufficient documentation

## 2014-04-22 DIAGNOSIS — R05 Cough: Secondary | ICD-10-CM

## 2014-04-22 DIAGNOSIS — Z124 Encounter for screening for malignant neoplasm of cervix: Secondary | ICD-10-CM

## 2014-04-22 DIAGNOSIS — R059 Cough, unspecified: Secondary | ICD-10-CM

## 2014-04-22 DIAGNOSIS — M949 Disorder of cartilage, unspecified: Secondary | ICD-10-CM

## 2014-04-22 NOTE — Progress Notes (Signed)
Subjective:    Patient ID: Erica Little, female    DOB: 1946/04/24, 68 y.o.   MRN: [id number]  HPI 68 year old female with past history of rheumatoid arthritis (on Orencia), hypertension and hypercholesterolemia.  She comes in today to follow up on these issues as well as for a complete physical exam.   States she is doing well.  Denies any chest pain, tightness or sob.  States she is eating and drinking well.  Has lost weight, but weight is up from the last check.  Bowels stable.  Still seeing Dr Gavin Potters for her arthritis.   Blood pressure doing well.  She reports persistent cough.  Some chest congestion.  Minimal wheezing.  No fever.  Does not feel bad.     Past Medical History  Diagnosis Date  . Rheumatoid arthritis(714.0)     transaminitis on MTX, remicade rxn, s/p sulfasalazine, orencia  . Ulcer disease   . Hypertension   . Hyperlipidemia   . MRSA (methicillin resistant Staphylococcus aureus)     skin infections  . GERD (gastroesophageal reflux disease)     Current Outpatient Prescriptions on File Prior to Visit  Medication Sig Dispense Refill  . abatacept (ORENCIA) 250 MG injection Once every month      . amLODipine (NORVASC) 2.5 MG tablet TAKE 1 TABLET (2.5 MG TOTAL) BY MOUTH DAILY.  30 tablet  5  . calcium gluconate 650 MG tablet Take 650 mg by mouth daily.      . cholecalciferol (VITAMIN D) 1000 UNITS tablet Take 1,000 Units by mouth daily.      Marland Kitchen etodolac (LODINE) 400 MG tablet Take 400 mg by mouth daily.       Marland Kitchen losartan (COZAAR) 100 MG tablet TAKE 1 TABLET ONCE A DAY  30 tablet  5  . Multiple Vitamin (MULTIVITAMIN) tablet Take 1 tablet by mouth daily.      Marland Kitchen omeprazole (PRILOSEC) 20 MG capsule TAKE ONE CAPSULE BY MOUTH EVERY DAY  30 capsule  11  . pravastatin (PRAVACHOL) 10 MG tablet TAKE 1 TABLET (10 MG TOTAL) BY MOUTH DAILY.  90 tablet  0  . fluticasone (FLONASE) 50 MCG/ACT nasal spray Place 2 sprays into the nose daily as needed.       No current  facility-administered medications on file prior to visit.    Review of Systems Patient denies any headache, lightheadedness or dizziness.  No fever or chills.  No chest pain, tightness or palpitations.  No increased shortness of breath.  Persistent cough and congestion as outlined.   No nausea or vomiting.  No abdominal pain or cramping.  No bowel change, such as diarrhea, constipation, BRBPR or melana.  No urine change.  Overall she feels she is doing relatively well.     Objective:   Physical Exam  Filed Vitals:   04/22/14 1421  BP: 120/70  Pulse: 76  Temp: 98.3 F (54.62 C)   68 year old female in no acute distress.   HEENT:  Nares- clear.  Oropharynx - without lesions. NECK:  Supple.  Nontender.  No audible bruit.  HEART:  Appears to be regular. LUNGS:  No crackles or wheezing audible.  Respirations even and unlabored.  Some minimal increased cough with forced expiration.   RADIAL PULSE:  Equal bilaterally.  BREASTS:  No nipple discharge or nipple retraction present.  Could not appreciate any distinct nodules or axillary adenopathy to be present.   ABDOMEN:  Soft, nontender.  Bowel sounds present and normal.  No audible abdominal bruit.  GU  Normal external genitalia. Vaginal vault without lesions.  Cervix identified.  Pap performed at pts request.   Could not appreciate any adnexal masses or tenderness.    EXTREMITIES:  No increased edema present.  DP pulses palpable and equal bilaterally.          Assessment & Plan:  LYMPHADENOPATHY.  There were several hypoechoic masses noted on ultrasound.  (Felt to be consistent with lymph nodes).  Had the infection as outlined in previous note.  Minimal inguinal lymphadenopathy noted previously, but cannot appreciate on exam today.  Discussed with her regarding further w/up and repeat ultrasound or scanning.  She declines.    CAROTID BRUIT.  Carotid ultrasound revealed no significant stenosis.   INCREASED PSYCHOSOCIAL STRESSORS.  Feels she  is doing better.  Eating well.  Follow.    PREVIOUS WEIGHT LOSS.  Weight as outlined.  Up a couple of pounds from the last check.  Follow.   HEALTH MAINTENANCE.  Physical today.  Bone density 10//12/11 revealed osteopenia.  Colonoscopy 09/18/06.  Was due 9/12.  Discussed with her today regarding need for follow up colonoscopy.  She will let me know when agreeable.   Mammogram 04/22/13 - ok.   She is scheduled for a f/u mammogram next week.

## 2014-04-22 NOTE — Patient Instructions (Signed)
mucinex DM one tablet in the am and Robitussin DM in the evening.   Saline nasal spray - flush nose at least 2-3x/day.   Flonase nasal spray - 2 sprays each nostril one time per day Albuterol inhaler - 2 sprays each nostril as directed.

## 2014-04-22 NOTE — Progress Notes (Signed)
Pre visit review using our clinic review tool, if applicable. No additional management support is needed unless otherwise documented below in the visit note. 

## 2014-04-23 MED ORDER — ALBUTEROL SULFATE HFA 108 (90 BASE) MCG/ACT IN AERS: 2.0000 | INHALATION_SPRAY | Freq: Four times a day (QID) | RESPIRATORY_TRACT | Status: DC | PRN

## 2014-04-24 ENCOUNTER — Encounter: Payer: Self-pay | Admitting: Internal Medicine

## 2014-04-24 DIAGNOSIS — R053 Chronic cough: Secondary | ICD-10-CM | POA: Insufficient documentation

## 2014-04-24 DIAGNOSIS — R05 Cough: Secondary | ICD-10-CM | POA: Insufficient documentation

## 2014-04-24 DIAGNOSIS — R059 Cough, unspecified: Secondary | ICD-10-CM | POA: Insufficient documentation

## 2014-04-24 NOTE — Assessment & Plan Note (Signed)
Persistent.  Will check cxr.  Treat with mucinex DM and robitussin DM as directed.  Albuterol inhaler as directed.  Saline nasal spray and steroid nasal spray as directed.  Follow.  Notify me if persistent.  Do not feel abx warranted at this time.  Follow.

## 2014-04-24 NOTE — Assessment & Plan Note (Signed)
Continue vitamin D.  

## 2014-04-24 NOTE — Assessment & Plan Note (Signed)
Followed by Dr Kernodle.   

## 2014-04-24 NOTE — Assessment & Plan Note (Signed)
On pravastatin.  Low cholesterol diet.  Check lipid panel and liver function.

## 2014-04-24 NOTE — Assessment & Plan Note (Signed)
Blood pressure under good control.  Same medication regimen.  Follow metabolic panel.   

## 2014-04-24 NOTE — Assessment & Plan Note (Signed)
Recheck electrolytes to confirm stable.

## 2014-04-25 ENCOUNTER — Other Ambulatory Visit (INDEPENDENT_AMBULATORY_CARE_PROVIDER_SITE_OTHER): Payer: Medicare Other

## 2014-04-25 ENCOUNTER — Ambulatory Visit (INDEPENDENT_AMBULATORY_CARE_PROVIDER_SITE_OTHER)
Admission: RE | Admit: 2014-04-25 | Discharge: 2014-04-25 | Disposition: A | Discharge status: Home / Self Care | Payer: Medicare Other | Source: Ambulatory Visit | Attending: Internal Medicine | Admitting: Internal Medicine

## 2014-04-25 DIAGNOSIS — I10 Essential (primary) hypertension: Secondary | ICD-10-CM

## 2014-04-25 DIAGNOSIS — E871 Hypo-osmolality and hyponatremia: Secondary | ICD-10-CM

## 2014-04-25 DIAGNOSIS — R05 Cough: Secondary | ICD-10-CM

## 2014-04-25 DIAGNOSIS — R059 Cough, unspecified: Secondary | ICD-10-CM

## 2014-04-25 DIAGNOSIS — E7800 Pure hypercholesterolemia, unspecified: Secondary | ICD-10-CM

## 2014-04-25 DIAGNOSIS — M069 Rheumatoid arthritis, unspecified: Secondary | ICD-10-CM

## 2014-04-25 DIAGNOSIS — M8580 Other specified disorders of bone density and structure, unspecified site: Secondary | ICD-10-CM

## 2014-04-25 LAB — HEPATIC FUNCTION PANEL
ALK PHOS: 125 U/L — AB (ref 39–117)
ALT: 17 U/L (ref 0–35)
AST: 19 U/L (ref 0–37)
Albumin: 4 g/dL (ref 3.5–5.2)
Bilirubin, Direct: 0.1 mg/dL (ref 0.0–0.3)
Total Bilirubin: 0.8 mg/dL (ref 0.3–1.2)
Total Protein: 7.2 g/dL (ref 6.0–8.3)

## 2014-04-25 LAB — LIPID PANEL
CHOL/HDL RATIO: 6
Cholesterol: 226 mg/dL — ABNORMAL HIGH (ref 0–200)
HDL: 37.6 mg/dL — ABNORMAL LOW (ref >39.00)
LDL Cholesterol: 152 mg/dL — ABNORMAL HIGH (ref 0–99)
Triglycerides: 180 mg/dL — ABNORMAL HIGH (ref 0.0–149.0)
VLDL: 36 mg/dL (ref 0.0–40.0)

## 2014-04-25 LAB — BASIC METABOLIC PANEL
BUN: 16 mg/dL (ref 6–23)
CALCIUM: 9.2 mg/dL (ref 8.4–10.5)
CO2: 23 mEq/L (ref 19–32)
Chloride: 103 mEq/L (ref 96–112)
Creatinine, Ser: 1 mg/dL (ref 0.4–1.2)
GFR: 59.75 mL/min — AB (ref >60.00)
GLUCOSE: 91 mg/dL (ref 70–99)
POTASSIUM: 4.2 meq/L (ref 3.5–5.1)
SODIUM: 134 meq/L — AB (ref 135–145)

## 2014-04-25 LAB — TSH: TSH: 2.93 u[IU]/mL (ref 0.35–5.50)

## 2014-04-26 LAB — VITAMIN D 25 HYDROXY (VIT D DEFICIENCY, FRACTURES): VIT D 25 HYDROXY: 41 ng/mL (ref 30–89)

## 2014-04-27 ENCOUNTER — Ambulatory Visit
Admission: RE | Admit: 2014-04-27 | Discharge: 2014-04-27 | Disposition: A | Discharge status: Home / Self Care | Payer: Medicare Other | Source: Ambulatory Visit

## 2014-04-27 DIAGNOSIS — Z1231 Encounter for screening mammogram for malignant neoplasm of breast: Secondary | ICD-10-CM

## 2014-04-28 ENCOUNTER — Encounter: Payer: Self-pay | Admitting: *Deleted

## 2014-04-28 ENCOUNTER — Other Ambulatory Visit: Payer: Self-pay | Admitting: Internal Medicine

## 2014-04-28 DIAGNOSIS — E871 Hypo-osmolality and hyponatremia: Secondary | ICD-10-CM

## 2014-04-28 DIAGNOSIS — R748 Abnormal levels of other serum enzymes: Secondary | ICD-10-CM

## 2014-04-28 NOTE — Progress Notes (Signed)
Order placed for f/u labs.  

## 2014-05-01 ENCOUNTER — Other Ambulatory Visit: Payer: Self-pay | Admitting: Internal Medicine

## 2014-05-02 ENCOUNTER — Encounter: Payer: Self-pay | Admitting: *Deleted

## 2014-05-04 ENCOUNTER — Telehealth: Payer: Self-pay | Admitting: *Deleted

## 2014-05-04 ENCOUNTER — Other Ambulatory Visit: Payer: Self-pay | Admitting: *Deleted

## 2014-05-04 MED ORDER — ROSUVASTATIN CALCIUM 5 MG PO TABS: 5.0000 mg | ORAL_TABLET | Freq: Every day | ORAL | Status: DC

## 2014-05-04 MED ORDER — ROSUVASTATIN CALCIUM 5 MG PO TABS: ORAL_TABLET | ORAL | Status: DC

## 2014-05-04 NOTE — Telephone Encounter (Signed)
If persistent problems and needing prednisone, need reevaluation.  I can work her in tomorrow at 11:45 - 12:00.  Please call and see if can come in then.  Thanks.

## 2014-05-04 NOTE — Telephone Encounter (Signed)
Pt left voicemail stating that her cough is still present (04/22/14-CXR: clear) pt wants to know if she could do a prednisone course. Please advise. Pt was seen on 04/22/14

## 2014-05-04 NOTE — Telephone Encounter (Signed)
Pt notified. appt scheduled.

## 2014-05-05 ENCOUNTER — Ambulatory Visit (INDEPENDENT_AMBULATORY_CARE_PROVIDER_SITE_OTHER): Payer: Medicare Other | Admitting: Internal Medicine

## 2014-05-05 ENCOUNTER — Encounter: Payer: Self-pay | Admitting: Internal Medicine

## 2014-05-05 VITALS — BP 122/74 | HR 84 | Temp 98.4°F | Resp 16 | Ht 61.5 in | Wt 113.2 lb

## 2014-05-05 DIAGNOSIS — E871 Hypo-osmolality and hyponatremia: Secondary | ICD-10-CM

## 2014-05-05 DIAGNOSIS — E7800 Pure hypercholesterolemia, unspecified: Secondary | ICD-10-CM

## 2014-05-05 DIAGNOSIS — R748 Abnormal levels of other serum enzymes: Secondary | ICD-10-CM

## 2014-05-05 DIAGNOSIS — R059 Cough, unspecified: Secondary | ICD-10-CM

## 2014-05-05 DIAGNOSIS — R05 Cough: Secondary | ICD-10-CM

## 2014-05-05 LAB — ALKALINE PHOSPHATASE: Alkaline Phosphatase: 129 U/L — ABNORMAL HIGH (ref 39–117)

## 2014-05-05 LAB — SODIUM: Sodium: 137 mEq/L (ref 135–145)

## 2014-05-05 MED ORDER — PREDNISONE 10 MG PO TABS: ORAL_TABLET | ORAL | Status: DC

## 2014-05-05 MED ORDER — CEFDINIR 300 MG PO CAPS: 300.0000 mg | ORAL_CAPSULE | Freq: Two times a day (BID) | ORAL | Status: DC

## 2014-05-05 MED ORDER — FLUTICASONE PROPIONATE HFA 110 MCG/ACT IN AERO: 2.0000 | INHALATION_SPRAY | Freq: Two times a day (BID) | RESPIRATORY_TRACT | Status: DC

## 2014-05-05 NOTE — Progress Notes (Signed)
Pre visit review using our clinic review tool, if applicable. No additional management support is needed unless otherwise documented below in the visit note. 

## 2014-05-06 ENCOUNTER — Encounter: Payer: Medicare Other | Admitting: Internal Medicine

## 2014-05-06 ENCOUNTER — Encounter: Payer: Self-pay | Admitting: *Deleted

## 2014-05-08 ENCOUNTER — Encounter: Payer: Self-pay | Admitting: Internal Medicine

## 2014-05-08 NOTE — Progress Notes (Signed)
Subjective:    Patient ID: Erica Little, female    DOB: 06-15-1946, 68 y.o.   MRN: [id number]  Cough  68 year old female with past history of rheumatoid arthritis (on Orencia), hypertension and hypercholesterolemia.  She comes in today as a work in with concerns regarding persistent cough.  She reports that she continues to have increased cough.   Denies any chest pain, tightness or sob.  States she is eating and drinking well.  Bowels stable.  Still seeing Dr Gavin Potters for her arthritis.   Blood pressure doing well.  She reports persistent cough.  Some chest congestion.  Minimal wheezing.  No fever.  Fatigue from increased cough.      Past Medical History  Diagnosis Date  . Rheumatoid arthritis(714.0)     transaminitis on MTX, remicade rxn, s/p sulfasalazine, orencia  . Ulcer disease   . Hypertension   . Hyperlipidemia   . MRSA (methicillin resistant Staphylococcus aureus)     skin infections  . GERD (gastroesophageal reflux disease)     Current Outpatient Prescriptions on File Prior to Visit  Medication Sig Dispense Refill  . abatacept (ORENCIA) 250 MG injection Once every month      . albuterol (PROVENTIL HFA;VENTOLIN HFA) 108 (90 BASE) MCG/ACT inhaler Inhale 2 puffs into the lungs every 6 (six) hours as needed for wheezing or shortness of breath.  1 Inhaler  0  . amLODipine (NORVASC) 2.5 MG tablet TAKE 1 TABLET (2.5 MG TOTAL) BY MOUTH DAILY.  30 tablet  5  . calcium gluconate 650 MG tablet Take 650 mg by mouth daily.      . cholecalciferol (VITAMIN D) 1000 UNITS tablet Take 1,000 Units by mouth daily.      Marland Kitchen etodolac (LODINE) 400 MG tablet Take 400 mg by mouth daily.       . fluticasone (FLONASE) 50 MCG/ACT nasal spray Place 2 sprays into the nose daily as needed.      Marland Kitchen losartan (COZAAR) 100 MG tablet TAKE 1 TABLET ONCE A DAY  30 tablet  5  . methotrexate (RHEUMATREX) 2.5 MG tablet Take 2.5 mg by mouth once a week.       . Multiple Vitamin (MULTIVITAMIN) tablet Take 1 tablet by  mouth daily.      Marland Kitchen omeprazole (PRILOSEC) 20 MG capsule TAKE ONE CAPSULE BY MOUTH EVERY DAY  30 capsule  11  . rosuvastatin (CRESTOR) 5 MG tablet Take one tablet every Monday, Wednesday, & Friday  15 tablet  2   No current facility-administered medications on file prior to visit.    Review of Systems  Respiratory: Positive for cough.   Patient denies any headache, lightheadedness or dizziness.  No fever or chills.  No chest pain, tightness or palpitations.  No increased shortness of breath.  Persistent cough and congestion as outlined.   No nausea or vomiting.  No abdominal pain or cramping.  No bowel change, such as diarrhea, constipation, BRBPR or melana.  No urine change.  Overall she feels she is doing relatively well.  Discussed her cholesterol today as well.  She has had problems with zocor, zetia, lipitor and pravastatin.     Objective:   Physical Exam  Filed Vitals:   05/05/14 1206  BP: 122/74  Pulse: 84  Temp: 98.4 F (36.9 C)  Resp: 75   68 year old female in no acute distress.   HEENT:  Nares- clear.  Oropharynx - without lesions. NECK:  Supple.  Nontender.  No audible  bruit.  HEART:  Appears to be regular. LUNGS:  No crackles or wheezing audible.  Respirations even and unlabored.  Some minimal increased cough with forced expiration.   RADIAL PULSE:  Equal bilaterally.  EXTREMITIES:  No increased edema present.  DP pulses palpable and equal bilaterally.          Assessment & Plan:  LYMPHADENOPATHY.  There were several hypoechoic masses noted on ultrasound.  (Felt to be consistent with lymph nodes).  Had the infection as outlined in previous note.  Minimal inguinal lymphadenopathy noted previously, but cannot appreciate on exam today.  Have discussed with her regarding further w/up and repeat ultrasound or scanning.  She declines.    CAROTID BRUIT.  Carotid ultrasound revealed no significant stenosis.   INCREASED PSYCHOSOCIAL STRESSORS.  Feels she is doing better.   Eating well.  Follow.    PREVIOUS WEIGHT LOSS.  Weight as outlined.  Up a couple of pounds from the last check.  Follow.   HEALTH MAINTENANCE.  Physical last visit.  Bone density 10//12/11 revealed osteopenia.  Colonoscopy 09/18/06.  Was due 9/12.  Have discussed with her today regarding need for follow up colonoscopy.  She will let me know when agreeable.   Mammogram 03/16/14 - Bir[facility] I.

## 2014-05-08 NOTE — Assessment & Plan Note (Signed)
Will start crestor as outlined.   Low cholesterol diet.  Follow lipid panel and liver function.  Will need f/u liver panel in 6 weeks.

## 2014-05-08 NOTE — Assessment & Plan Note (Addendum)
Persistent.  Previous cxr negative.  Continue mucinex DM and robitussin DM as directed.  Albuterol inhaler as directed.  Saline nasal spray and steroid nasal spray as directed.  Prednisone taper as outlined.  Flovent inhaler as directed.   omnicef 300mg  bid x 10 days.  Follow.  Notify me if persistent problems.

## 2014-05-12 ENCOUNTER — Other Ambulatory Visit: Payer: Medicare Other

## 2014-05-17 ENCOUNTER — Other Ambulatory Visit: Payer: Self-pay | Admitting: Internal Medicine

## 2014-06-20 ENCOUNTER — Telehealth: Payer: Self-pay | Admitting: *Deleted

## 2014-06-20 ENCOUNTER — Other Ambulatory Visit: Payer: Medicare Other

## 2014-06-20 NOTE — Telephone Encounter (Signed)
In the appt note, it mentions liver panel. She has an appt tomorrow with Dr. Lorin Picket to discuss.

## 2014-06-20 NOTE — Telephone Encounter (Signed)
Noted.  Pt has an appt with me tomorrow.

## 2014-06-20 NOTE — Telephone Encounter (Signed)
Pt came in and asked what she was getting done with labs, there was no orders in and on her last lab note there was nothing that said she needed to repeat anything just to schedule as f/u, i told her i didn't see the orders but that i can draw the standard tubes, she said that she didn't want to have that done if there was no orders in

## 2014-06-21 ENCOUNTER — Encounter: Payer: Self-pay | Admitting: Internal Medicine

## 2014-06-21 ENCOUNTER — Ambulatory Visit (INDEPENDENT_AMBULATORY_CARE_PROVIDER_SITE_OTHER): Payer: Medicare Other | Admitting: Internal Medicine

## 2014-06-21 VITALS — BP 130/80 | HR 71 | Temp 98.3°F | Ht 61.5 in | Wt 114.5 lb

## 2014-06-21 DIAGNOSIS — E871 Hypo-osmolality and hyponatremia: Secondary | ICD-10-CM

## 2014-06-21 DIAGNOSIS — R059 Cough, unspecified: Secondary | ICD-10-CM

## 2014-06-21 DIAGNOSIS — M069 Rheumatoid arthritis, unspecified: Secondary | ICD-10-CM

## 2014-06-21 DIAGNOSIS — I10 Essential (primary) hypertension: Secondary | ICD-10-CM

## 2014-06-21 DIAGNOSIS — E7800 Pure hypercholesterolemia, unspecified: Secondary | ICD-10-CM

## 2014-06-21 DIAGNOSIS — R05 Cough: Secondary | ICD-10-CM

## 2014-06-21 DIAGNOSIS — R748 Abnormal levels of other serum enzymes: Secondary | ICD-10-CM

## 2014-06-21 NOTE — Progress Notes (Signed)
Pre visit review using our clinic review tool, if applicable. No additional management support is needed unless otherwise documented below in the visit note. 

## 2014-06-26 ENCOUNTER — Encounter: Payer: Self-pay | Admitting: Internal Medicine

## 2014-06-26 NOTE — Assessment & Plan Note (Signed)
Follow electrolytes to confirm stable.  

## 2014-06-26 NOTE — Assessment & Plan Note (Signed)
Blood pressure as outlined.  Same medication regimen.  Follow metabolic panel.  

## 2014-06-26 NOTE — Assessment & Plan Note (Signed)
Followed by Dr Kernodle.   

## 2014-06-26 NOTE — Assessment & Plan Note (Signed)
Appears to be tolerating crestor.  Low cholesterol diet.  Follow lipid panel and liver function.  Will need f/u liver panel.

## 2014-06-26 NOTE — Progress Notes (Signed)
Subjective:    Patient ID: Erica Little, female    DOB: May 15, 1946, 68 y.o.   MRN: [id number]  HPI 68 year old female with past history of rheumatoid arthritis (on Orencia), hypertension and hypercholesterolemia.  She comes in today for a scheduled follow up.   States she is doing well.  Denies any chest pain, tightness or sob.  States she is eating and drinking well.  Has lost weight, but weight is up from the last check.  Bowels stable.  Still seeing Dr Gavin Potters for her arthritis.   Blood pressure doing well.  Cough has essentially resolved.  She feels better.     Past Medical History  Diagnosis Date  . Rheumatoid arthritis(714.0)     transaminitis on MTX, remicade rxn, s/p sulfasalazine, orencia  . Ulcer disease   . Hypertension   . Hyperlipidemia   . MRSA (methicillin resistant Staphylococcus aureus)     skin infections  . GERD (gastroesophageal reflux disease)     Current Outpatient Prescriptions on File Prior to Visit  Medication Sig Dispense Refill  . abatacept (ORENCIA) 250 MG injection Once every month      . albuterol (PROVENTIL HFA;VENTOLIN HFA) 108 (90 BASE) MCG/ACT inhaler Inhale 2 puffs into the lungs every 6 (six) hours as needed for wheezing or shortness of breath.  1 Inhaler  0  . amLODipine (NORVASC) 2.5 MG tablet TAKE 1 TABLET (2.5 MG TOTAL) BY MOUTH DAILY.  30 tablet  5  . calcium gluconate 650 MG tablet Take 650 mg by mouth daily.      . cholecalciferol (VITAMIN D) 1000 UNITS tablet Take 1,000 Units by mouth daily.      Marland Kitchen etodolac (LODINE) 400 MG tablet Take 400 mg by mouth daily.       . fluticasone (FLONASE) 50 MCG/ACT nasal spray Place 2 sprays into the nose daily as needed.      . fluticasone (FLOVENT HFA) 110 MCG/ACT inhaler Inhale 2 puffs into the lungs 2 (two) times daily.  1 Inhaler  0  . losartan (COZAAR) 100 MG tablet TAKE 1 TABLET BY MOUTH EVERY DAY  30 tablet  5  . methotrexate (RHEUMATREX) 2.5 MG tablet Take 2.5 mg by mouth once a week.       .  Multiple Vitamin (MULTIVITAMIN) tablet Take 1 tablet by mouth daily.      Marland Kitchen omeprazole (PRILOSEC) 20 MG capsule TAKE ONE CAPSULE BY MOUTH EVERY DAY  30 capsule  11  . rosuvastatin (CRESTOR) 5 MG tablet Take one tablet every Monday, Wednesday, & Friday  15 tablet  2   No current facility-administered medications on file prior to visit.    Review of Systems Patient denies any headache, lightheadedness or dizziness.  No fever or chills.  No chest pain, tightness or palpitations.  No increased shortness of breath.  Cough has resolved.   No nausea or vomiting.  No abdominal pain or cramping.  No bowel change, such as diarrhea, constipation, BRBPR or melana.  No acid reflux.  No urine change.  Overall she feels she is doing relatively well.  Feels better.  Tolerating cholesterol medication.      Objective:   Physical Exam  Filed Vitals:   06/21/14 1154  BP: 130/80  Pulse: 71  Temp: 98.3 F (16.31 C)   68 year old female in no acute distress.   HEENT:  Nares- clear.  Oropharynx - without lesions. NECK:  Supple.  Nontender.  No audible bruit.  HEART:  Appears to be regular. LUNGS:  No crackles or wheezing audible.  Respirations even and unlabored.  No cough with expiration or forced expiration.   RADIAL PULSE:  Equal bilaterally.  ABDOMEN:  Soft, nontender.  Bowel sounds present and normal.  No audible abdominal bruit.    EXTREMITIES:  No increased edema present.  DP pulses palpable and equal bilaterally.          Assessment & Plan:  LYMPHADENOPATHY.  There were several hypoechoic masses noted on ultrasound.  (Felt to be consistent with lymph nodes).  Had the infection as outlined in previous note.  Minimal inguinal lymphadenopathy noted previously, but cannot appreciate on exam today.  Discussed with her regarding further w/up and repeat ultrasound or scanning.  She declines.    CAROTID BRUIT.  Carotid ultrasound revealed no significant stenosis.   INCREASED PSYCHOSOCIAL STRESSORS.  Feels  she is doing better.  Eating well.  Follow.    PREVIOUS WEIGHT LOSS.  Weight as outlined.  Up a pound from the last check.  Follow.   HEALTH MAINTENANCE.  Physical last.  Bone density 10//12/11 revealed osteopenia.  Colonoscopy 09/18/06.  Was due 9/12.  Discussed with her today regarding need for follow up colonoscopy.  She will let me know when agreeable.   Mammogram 03/16/14 - Birads I.

## 2014-06-26 NOTE — Assessment & Plan Note (Signed)
Resolved

## 2014-07-19 ENCOUNTER — Telehealth: Payer: Self-pay | Admitting: Internal Medicine

## 2014-07-19 ENCOUNTER — Other Ambulatory Visit (INDEPENDENT_AMBULATORY_CARE_PROVIDER_SITE_OTHER): Payer: Medicare Other

## 2014-07-19 ENCOUNTER — Other Ambulatory Visit: Payer: Self-pay | Admitting: Internal Medicine

## 2014-07-19 DIAGNOSIS — E7800 Pure hypercholesterolemia, unspecified: Secondary | ICD-10-CM

## 2014-07-19 DIAGNOSIS — R748 Abnormal levels of other serum enzymes: Secondary | ICD-10-CM

## 2014-07-19 LAB — HEPATIC FUNCTION PANEL
ALK PHOS: 101 U/L (ref 39–117)
ALT: 14 U/L (ref 0–35)
AST: 20 U/L (ref 0–37)
Albumin: 4.2 g/dL (ref 3.5–5.2)
BILIRUBIN DIRECT: 0.1 mg/dL (ref 0.0–0.3)
Total Bilirubin: 0.7 mg/dL (ref 0.2–1.2)
Total Protein: 7.2 g/dL (ref 6.0–8.3)

## 2014-07-19 NOTE — Telephone Encounter (Signed)
Pt dropped off BP readings. Readings are in Dr. Roby Lofts [email or web address]

## 2014-07-20 ENCOUNTER — Telehealth: Payer: Self-pay | Admitting: Internal Medicine

## 2014-07-20 ENCOUNTER — Encounter: Payer: Self-pay | Admitting: *Deleted

## 2014-07-20 NOTE — Telephone Encounter (Signed)
Notify pt that I received her blood pressure readings.  Overall her latest blood pressures looks good (averaging 119-132/68-72).  Continue to spot check.  Let me know if any problems.

## 2014-07-21 NOTE — Telephone Encounter (Signed)
Left message, notifying patient. 

## 2014-07-25 ENCOUNTER — Other Ambulatory Visit: Payer: Self-pay | Admitting: Internal Medicine

## 2014-07-25 LAB — ALKALINE PHOSPHATASE ISOENZYMES
Alkaline Phonsphatase: 122 U/L (ref 33–130)
BONE ISOENZYMES (ALP ISO): 6 % — AB (ref 28–66)
Intestinal Isoenzymes: 87 % — ABNORMAL HIGH (ref 1–24)
Liver Isoenzymes: 7 % — ABNORMAL LOW (ref 25–69)
MACROHEPATIC ISOENZYMES: 0 %

## 2014-07-28 NOTE — Telephone Encounter (Signed)
Error

## 2014-07-29 ENCOUNTER — Telehealth: Payer: Self-pay | Admitting: Internal Medicine

## 2014-07-29 DIAGNOSIS — R634 Abnormal weight loss: Secondary | ICD-10-CM

## 2014-07-29 DIAGNOSIS — Z1211 Encounter for screening for malignant neoplasm of colon: Secondary | ICD-10-CM

## 2014-07-29 DIAGNOSIS — R748 Abnormal levels of other serum enzymes: Secondary | ICD-10-CM

## 2014-07-29 NOTE — Telephone Encounter (Signed)
Spoke to Ms Lofland regarding her lab results.  I discussed lab results with Dr Marva Panda.  He recommended UGI and SBFT.  After discussion with the pt, she prefers referral to GI first (prior to ordering).  Needs colonsocopy - overdue.   Order placed for referral.

## 2014-08-10 ENCOUNTER — Encounter: Payer: Self-pay | Admitting: Internal Medicine

## 2014-08-10 ENCOUNTER — Ambulatory Visit (INDEPENDENT_AMBULATORY_CARE_PROVIDER_SITE_OTHER): Payer: Medicare Other | Admitting: Internal Medicine

## 2014-08-10 ENCOUNTER — Other Ambulatory Visit: Payer: Self-pay | Admitting: Internal Medicine

## 2014-08-10 VITALS — BP 122/72 | HR 75 | Temp 98.3°F | Resp 14 | Ht 61.5 in | Wt 113.5 lb

## 2014-08-10 DIAGNOSIS — N760 Acute vaginitis: Secondary | ICD-10-CM

## 2014-08-10 DIAGNOSIS — N898 Other specified noninflammatory disorders of vagina: Secondary | ICD-10-CM

## 2014-08-10 DIAGNOSIS — R102 Pelvic and perineal pain: Secondary | ICD-10-CM

## 2014-08-10 DIAGNOSIS — R300 Dysuria: Secondary | ICD-10-CM

## 2014-08-10 DIAGNOSIS — N899 Noninflammatory disorder of vagina, unspecified: Secondary | ICD-10-CM

## 2014-08-10 LAB — POCT URINALYSIS DIPSTICK
Blood, UA: NEGATIVE
Glucose, UA: NEGATIVE
Ketones, UA: NEGATIVE
Leukocytes, UA: NEGATIVE
NITRITE UA: NEGATIVE
PH UA: 7
Protein, UA: NEGATIVE
SPEC GRAV UA: 1.01
UROBILINOGEN UA: 0.2

## 2014-08-10 NOTE — Progress Notes (Signed)
Pre visit review using our clinic review tool, if applicable. No additional management support is needed unless otherwise documented below in the visit note. 

## 2014-08-10 NOTE — Progress Notes (Signed)
Order placed for urine culture 

## 2014-08-11 LAB — WET PREP BY MOLECULAR PROBE
Candida species: NEGATIVE
Gardnerella vaginalis: NEGATIVE
TRICHOMONAS VAG: NEGATIVE

## 2014-08-14 ENCOUNTER — Encounter: Payer: Self-pay | Admitting: Internal Medicine

## 2014-08-14 DIAGNOSIS — N898 Other specified noninflammatory disorders of vagina: Secondary | ICD-10-CM | POA: Insufficient documentation

## 2014-08-14 LAB — URINE CULTURE

## 2014-08-14 NOTE — Progress Notes (Signed)
Subjective:    Patient ID: Erica Little, female    DOB: 25-May-1946, 68 y.o.   MRN: [id number]  Urinary Tract Infection   68 year old female with past history of rheumatoid arthritis (on Orencia), hypertension and hypercholesterolemia.  She comes in today as a work in with concerns regarding a pressure sensation in her vagina.  States symptoms started three weeks ago.  Noticed some pressure in her vaginal area.  No vaginal discharge or vaginal itching.  Was questioning if she had a uti.  She was using wipes regularly.  Stopped these recently, thinking they might be aggravating.   No hematuria.  No dysuria.  No abdominal pain or cramping.  Eating and drinking well.  No nausea or vomiting.       Past Medical History  Diagnosis Date  . Rheumatoid arthritis(714.0)     transaminitis on MTX, remicade rxn, s/p sulfasalazine, orencia  . Ulcer disease   . Hypertension   . Hyperlipidemia   . MRSA (methicillin resistant Staphylococcus aureus)     skin infections  . GERD (gastroesophageal reflux disease)     Current Outpatient Prescriptions on File Prior to Visit  Medication Sig Dispense Refill  . abatacept (ORENCIA) 250 MG injection Once every month      . albuterol (PROVENTIL HFA;VENTOLIN HFA) 108 (90 BASE) MCG/ACT inhaler Inhale 2 puffs into the lungs every 6 (six) hours as needed for wheezing or shortness of breath.  1 Inhaler  0  . amLODipine (NORVASC) 2.5 MG tablet TAKE 1 TABLET (2.5 MG TOTAL) BY MOUTH DAILY.  30 tablet  5  . calcium gluconate 650 MG tablet Take 650 mg by mouth daily.      . cholecalciferol (VITAMIN D) 1000 UNITS tablet Take 1,000 Units by mouth daily.      Marland Kitchen etodolac (LODINE) 400 MG tablet Take 400 mg by mouth daily.       . fluticasone (FLONASE) 50 MCG/ACT nasal spray Place 2 sprays into the nose daily as needed.      . fluticasone (FLOVENT HFA) 110 MCG/ACT inhaler Inhale 2 puffs into the lungs 2 (two) times daily.  1 Inhaler  0  . losartan (COZAAR) 100 MG tablet TAKE 1  TABLET BY MOUTH EVERY DAY  30 tablet  5  . methotrexate (RHEUMATREX) 2.5 MG tablet Take 2.5 mg by mouth once a week.       . Multiple Vitamin (MULTIVITAMIN) tablet Take 1 tablet by mouth daily.      Marland Kitchen omeprazole (PRILOSEC) 20 MG capsule TAKE ONE CAPSULE BY MOUTH EVERY DAY  30 capsule  5  . rosuvastatin (CRESTOR) 5 MG tablet Take one tablet every Monday, Wednesday, & Friday  15 tablet  2   No current facility-administered medications on file prior to visit.    Review of Systems  No fever or chills.   No nausea or vomiting.  No abdominal pain or cramping.  No bowel change, such as diarrhea, constipation, BRBPR or melana.  Vaginal pressure as outlined.      Objective:   Physical Exam  Filed Vitals:   08/10/14 1131  BP: 122/72  Pulse: 75  Temp: 98.3 F (36.8 C)  Resp: 80   68 year old female in no acute distress. NECK:  Supple.  Nontender.  HEART:  Appears to be regular. LUNGS:  No crackles or wheezing audible.  Respirations even and unlabored.   ABDOMEN:  Soft, nontender.  Bowel sounds present and normal.   GU:  Normal external  genitalia.  Vaginal vault without lesions.  Cervix identified. No lesions identified.  Some atrophy changes.  Could not appreciate any adnexal masses or tenderness.       Assessment & Plan:  LYMPHADENOPATHY.  There were several hypoechoic masses noted on ultrasound.  (Felt to be consistent with lymph nodes).  Had the infection as outlined in previous note.  Minimal inguinal lymphadenopathy noted previously, but cannot appreciate on exam today.  Discussed with her regarding further w/up and repeat ultrasound or scanning.  She declines.    PREVIOUS WEIGHT LOSS.  Weight as outlined.  Stable from last check.    Follow.   HEALTH MAINTENANCE.  Physical 04/22/14.  Bone density 10//12/11 revealed osteopenia.  Colonoscopy 09/18/06.  Was due 9/12.  Discussed with her today regarding need for follow up colonoscopy.  She will let me know when agreeable.   Mammogram 03/16/14  - Birads I.

## 2014-08-14 NOTE — Assessment & Plan Note (Signed)
Vaginal pressure as outlined.  Exam as outlined.  KOH/Wet prep obtained.  Initially urine dip ok.  Will send for culture.  Hold on medication at this time.  Further treatment pending results.  Discussed possible ultrasound if persistent.  Wants to hold on lab results.  Follow.

## 2014-08-15 ENCOUNTER — Other Ambulatory Visit: Payer: Self-pay | Admitting: *Deleted

## 2014-08-15 ENCOUNTER — Other Ambulatory Visit: Payer: Self-pay | Admitting: Internal Medicine

## 2014-08-15 MED ORDER — CIPROFLOXACIN HCL 250 MG PO TABS: 250.0000 mg | ORAL_TABLET | Freq: Two times a day (BID) | ORAL | Status: DC

## 2014-09-21 ENCOUNTER — Ambulatory Visit (INDEPENDENT_AMBULATORY_CARE_PROVIDER_SITE_OTHER): Payer: Medicare Other | Admitting: Internal Medicine

## 2014-09-21 ENCOUNTER — Encounter: Payer: Self-pay | Admitting: Internal Medicine

## 2014-09-21 VITALS — BP 120/78 | HR 96 | Temp 98.2°F | Ht 61.5 in | Wt 115.0 lb

## 2014-09-21 DIAGNOSIS — M949 Disorder of cartilage, unspecified: Secondary | ICD-10-CM

## 2014-09-21 DIAGNOSIS — M899 Disorder of bone, unspecified: Secondary | ICD-10-CM

## 2014-09-21 DIAGNOSIS — R05 Cough: Secondary | ICD-10-CM

## 2014-09-21 DIAGNOSIS — R0989 Other specified symptoms and signs involving the circulatory and respiratory systems: Secondary | ICD-10-CM | POA: Insufficient documentation

## 2014-09-21 DIAGNOSIS — M8580 Other specified disorders of bone density and structure, unspecified site: Secondary | ICD-10-CM

## 2014-09-21 DIAGNOSIS — R059 Cough, unspecified: Secondary | ICD-10-CM

## 2014-09-21 DIAGNOSIS — I10 Essential (primary) hypertension: Secondary | ICD-10-CM

## 2014-09-21 DIAGNOSIS — E871 Hypo-osmolality and hyponatremia: Secondary | ICD-10-CM

## 2014-09-21 DIAGNOSIS — E7800 Pure hypercholesterolemia, unspecified: Secondary | ICD-10-CM

## 2014-09-21 DIAGNOSIS — M069 Rheumatoid arthritis, unspecified: Secondary | ICD-10-CM

## 2014-09-21 LAB — TSH: TSH: 0.73 u[IU]/mL (ref 0.35–4.50)

## 2014-09-21 LAB — LIPID PANEL
CHOL/HDL RATIO: 5
Cholesterol: 198 mg/dL (ref 0–200)
HDL: 41.8 mg/dL (ref >39.00)
LDL Cholesterol: 131 mg/dL — ABNORMAL HIGH (ref 0–99)
NonHDL: 156.2
Triglycerides: 127 mg/dL (ref 0.0–149.0)
VLDL: 25.4 mg/dL (ref 0.0–40.0)

## 2014-09-21 LAB — BASIC METABOLIC PANEL
BUN: 16 mg/dL (ref 6–23)
CO2: 22 mEq/L (ref 19–32)
Calcium: 9.5 mg/dL (ref 8.4–10.5)
Chloride: 104 mEq/L (ref 96–112)
Creatinine, Ser: 1.1 mg/dL (ref 0.4–1.2)
GFR: 50.63 mL/min — AB (ref >60.00)
Glucose, Bld: 87 mg/dL (ref 70–99)
POTASSIUM: 4.3 meq/L (ref 3.5–5.1)
Sodium: 135 mEq/L (ref 135–145)

## 2014-09-21 LAB — CBC WITH DIFFERENTIAL/PLATELET
BASOS PCT: 0.5 % (ref 0.0–3.0)
Basophils Absolute: 0 10*3/uL (ref 0.0–0.1)
EOS PCT: 3.6 % (ref 0.0–5.0)
Eosinophils Absolute: 0.2 10*3/uL (ref 0.0–0.7)
HCT: 37.4 % (ref 36.0–46.0)
Hemoglobin: 12.1 g/dL (ref 12.0–15.0)
LYMPHS PCT: 32.6 % (ref 12.0–46.0)
Lymphs Abs: 1.9 10*3/uL (ref 0.7–4.0)
MCHC: 32.5 g/dL (ref 30.0–36.0)
MCV: 88.7 fl (ref 78.0–100.0)
MONOS PCT: 9 % (ref 3.0–12.0)
Monocytes Absolute: 0.5 10*3/uL (ref 0.1–1.0)
Neutro Abs: 3.1 10*3/uL (ref 1.4–7.7)
Neutrophils Relative %: 54.3 % (ref 43.0–77.0)
Platelets: 198 10*3/uL (ref 150.0–400.0)
RBC: 4.22 Mil/uL (ref 3.87–5.11)
RDW: 16 % — ABNORMAL HIGH (ref 11.5–15.5)
WBC: 5.8 10*3/uL (ref 4.0–10.5)

## 2014-09-21 LAB — HEPATIC FUNCTION PANEL
ALT: 12 U/L (ref 0–35)
AST: 23 U/L (ref 0–37)
Albumin: 4.2 g/dL (ref 3.5–5.2)
Alkaline Phosphatase: 94 U/L (ref 39–117)
Bilirubin, Direct: 0.1 mg/dL (ref 0.0–0.3)
TOTAL PROTEIN: 7.4 g/dL (ref 6.0–8.3)
Total Bilirubin: 0.7 mg/dL (ref 0.2–1.2)

## 2014-09-21 NOTE — Progress Notes (Signed)
Subjective:    Patient ID: Erica Little, female    DOB: 1946-05-23, 68 y.o.   MRN: [id number]  HPI 68 year old female with past history of rheumatoid arthritis (on Orencia), hypertension and hypercholesterolemia.  She comes in today for a scheduled follow up.   States she is doing well.  Denies any chest pain, tightness or sob.  States she is eating and drinking well.  Has lost weight overall,  but weight is up from the last check.  Bowels stable.  Still seeing Dr Gavin Potters for her arthritis.  Planning for hand surgery at the end of October.   Blood pressure doing well.  Saw Dr Markham Jordan recently.  Planning for EGD and colonoscopy 09/23/14.     Past Medical History  Diagnosis Date  . Rheumatoid arthritis(714.0)     transaminitis on MTX, remicade rxn, s/p sulfasalazine, orencia  . Ulcer disease   . Hypertension   . Hyperlipidemia   . MRSA (methicillin resistant Staphylococcus aureus)     skin infections  . GERD (gastroesophageal reflux disease)     Current Outpatient Prescriptions on File Prior to Visit  Medication Sig Dispense Refill  . abatacept (ORENCIA) 250 MG injection Once every month      . amLODipine (NORVASC) 2.5 MG tablet TAKE 1 TABLET (2.5 MG TOTAL) BY MOUTH DAILY.  30 tablet  5  . calcium gluconate 650 MG tablet Take 650 mg by mouth daily.      . cholecalciferol (VITAMIN D) 1000 UNITS tablet Take 1,000 Units by mouth daily.      Marland Kitchen etodolac (LODINE) 400 MG tablet Take 400 mg by mouth daily.       . fluticasone (FLONASE) 50 MCG/ACT nasal spray Place 2 sprays into the nose daily as needed.      Marland Kitchen losartan (COZAAR) 100 MG tablet TAKE 1 TABLET BY MOUTH EVERY DAY  30 tablet  5  . methotrexate (RHEUMATREX) 2.5 MG tablet Take 2.5 mg by mouth once a week.       . Multiple Vitamin (MULTIVITAMIN) tablet Take 1 tablet by mouth daily.      Marland Kitchen omeprazole (PRILOSEC) 20 MG capsule TAKE ONE CAPSULE BY MOUTH EVERY DAY  30 capsule  5  . rosuvastatin (CRESTOR) 5 MG tablet Take one tablet every  Monday, Wednesday, & Friday  15 tablet  2  . albuterol (PROVENTIL HFA;VENTOLIN HFA) 108 (90 BASE) MCG/ACT inhaler Inhale 2 puffs into the lungs every 6 (six) hours as needed for wheezing or shortness of breath.  1 Inhaler  0   No current facility-administered medications on file prior to visit.    Review of Systems Patient denies any headache, lightheadedness or dizziness.  No fever or chills.  No chest pain, tightness or palpitations.  No increased shortness of breath.  No cough or congestion.  No nausea or vomiting.  No abdominal pain or cramping.  No bowel change, such as diarrhea, constipation, BRBPR or melana.  No acid reflux.  No urine change.  Overall she feels she is doing relatively well.  Planning for hand surgery.  Also having colonoscopy and EGD this week.     Objective:   Physical Exam  Filed Vitals:   09/21/14 0924  BP: 120/78  Pulse: 96  Temp: 98.2 F (16.44 C)   68 year old female in no acute distress.   HEENT:  Nares- clear.  Oropharynx - without lesions. NECK:  Supple.  Nontender.  Carotid bruit present.  HEART:  Appears to be  regular. LUNGS:  No crackles or wheezing audible.  Respirations even and unlabored.  No cough with expiration or forced expiration.   RADIAL PULSE:  Equal bilaterally.  ABDOMEN:  Soft, nontender.  Bowel sounds present and normal.  Femoral bruit vs lower abdominal bruit.    EXTREMITIES:  No increased edema present.  DP pulses palpable and equal bilaterally.          Assessment & Plan:  LYMPHADENOPATHY.  There were several hypoechoic masses noted on ultrasound.  (Felt to be consistent with lymph nodes).  Had the infection as outlined in previous note.  Minimal inguinal lymphadenopathy noted previously, but cannot appreciate on exam today.  Have discussed with her regarding further w/up and repeat ultrasound or scanning.  She declines.    CAROTID BRUIT.  Carotid ultrasound revealed no significant stenosis.  This was 12/2012.  Refer to vascular  surgery for reevaluation.    INCREASED PSYCHOSOCIAL STRESSORS.  Feels she is doing better.  Eating well.  Follow.    PREVIOUS WEIGHT LOSS.  Weight as outlined.  Up from the last check.  Follow.   HEALTH MAINTENANCE.  Physical 04/22/14.   Bone density 10//12/11 revealed osteopenia.  Colonoscopy 09/18/06.  Was due 9/12.  Scheduled this week for f/u colonoscopy.   Mammogram 03/16/14 - Birads I.     I spent 25 minutes with the patient and more than 50% of the time was spent in consultation regarding the above.

## 2014-09-21 NOTE — Assessment & Plan Note (Signed)
Blood pressure as outlined.   Same medication regimen.  Follow metabolic panel.  Will need close intra op and post op monitoring or heart rate and blood pressure to avoid extremes.

## 2014-09-21 NOTE — Assessment & Plan Note (Signed)
Follow electrolytes to confirm stable.

## 2014-09-21 NOTE — Assessment & Plan Note (Signed)
Abdominal vs femoral bruit.  Will refer to vascular surgery for further evaluation.  Dr Gilda Crease will see her tomorrow.

## 2014-09-21 NOTE — Assessment & Plan Note (Signed)
Has had carotid US 12/2012.  Refer back for reevaluation.

## 2014-09-21 NOTE — Assessment & Plan Note (Signed)
No cough or congestion now.  Follow.

## 2014-09-21 NOTE — Progress Notes (Signed)
Pre visit review using our clinic review tool, if applicable. No additional management support is needed unless otherwise documented below in the visit note. 

## 2014-09-21 NOTE — Assessment & Plan Note (Signed)
Continue vitamin D.  

## 2014-09-21 NOTE — Assessment & Plan Note (Signed)
Appears to be tolerating crestor.  Low cholesterol diet.  Follow lipid panel and liver function.

## 2014-09-21 NOTE — Assessment & Plan Note (Signed)
Followed by Dr Gavin Potters.  Planning for hand surgery end of next month.

## 2014-09-22 ENCOUNTER — Encounter: Payer: Self-pay | Admitting: *Deleted

## 2014-09-23 ENCOUNTER — Ambulatory Visit: Payer: Self-pay | Admitting: Unknown Physician Specialty

## 2014-09-23 LAB — HM COLONOSCOPY

## 2014-09-26 LAB — PATHOLOGY REPORT

## 2014-10-04 ENCOUNTER — Telehealth: Payer: Self-pay | Admitting: Internal Medicine

## 2014-10-04 NOTE — Telephone Encounter (Signed)
Spoke with pt, advised of appoint 10.8.15 at 8:30pm

## 2014-10-04 NOTE — Telephone Encounter (Signed)
The patient thinks she has shingle and wants to be seen soon. Please advise as to where to put this patient.

## 2014-10-05 ENCOUNTER — Encounter: Payer: Self-pay | Admitting: Internal Medicine

## 2014-10-06 ENCOUNTER — Ambulatory Visit (INDEPENDENT_AMBULATORY_CARE_PROVIDER_SITE_OTHER): Payer: Medicare Other | Admitting: Internal Medicine

## 2014-10-06 ENCOUNTER — Encounter: Payer: Self-pay | Admitting: Internal Medicine

## 2014-10-06 VITALS — BP 110/70 | HR 65 | Temp 98.2°F | Ht 61.5 in | Wt 116.8 lb

## 2014-10-06 DIAGNOSIS — R0989 Other specified symptoms and signs involving the circulatory and respiratory systems: Secondary | ICD-10-CM

## 2014-10-06 DIAGNOSIS — B029 Zoster without complications: Secondary | ICD-10-CM

## 2014-10-06 DIAGNOSIS — K2970 Gastritis, unspecified, without bleeding: Secondary | ICD-10-CM

## 2014-10-06 MED ORDER — VALACYCLOVIR HCL 1 G PO TABS: 1000.0000 mg | ORAL_TABLET | Freq: Three times a day (TID) | ORAL | Status: DC

## 2014-10-06 NOTE — Assessment & Plan Note (Signed)
EGD 09/23/14 - gastritis otherwise normal.  Currently asymptomatic.

## 2014-10-06 NOTE — Progress Notes (Signed)
Pre visit review using our clinic review tool, if applicable. No additional management support is needed unless otherwise documented below in the visit note. 

## 2014-10-06 NOTE — Progress Notes (Signed)
Subjective:    Patient ID: Erica Little, female    DOB: 26-Apr-1946, 68 y.o.   MRN: [id number]  HPI 68 year old female with past history of rheumatoid arthritis (on Orencia), hypertension and hypercholesterolemia.  She comes in today as a work in with concerns regarding possible shingles.   States she has been doing well.  Noticed increased pain in her right shoulder blade.  Then developed a rash.  Rash lower right breast and extends around to her upper back and shoulder blade.  No fever.  Pain better today.  Does not feels she needs anything more for pain.   Still seeing Dr Gavin Potters for her arthritis.  Planning for hand surgery at the end of October.   Blood pressure doing well.  Saw Dr Markham Jordan recently.  Had EGD and colonoscopy 09/23/14.     Past Medical History  Diagnosis Date  . Rheumatoid arthritis(714.0)     transaminitis on MTX, remicade rxn, s/p sulfasalazine, orencia  . Ulcer disease   . Hypertension   . Hyperlipidemia   . MRSA (methicillin resistant Staphylococcus aureus)     skin infections  . GERD (gastroesophageal reflux disease)     Current Outpatient Prescriptions on File Prior to Visit  Medication Sig Dispense Refill  . abatacept (ORENCIA) 250 MG injection Once every month      . albuterol (PROVENTIL HFA;VENTOLIN HFA) 108 (90 BASE) MCG/ACT inhaler Inhale 2 puffs into the lungs every 6 (six) hours as needed for wheezing or shortness of breath.  1 Inhaler  0  . amLODipine (NORVASC) 2.5 MG tablet TAKE 1 TABLET (2.5 MG TOTAL) BY MOUTH DAILY.  30 tablet  5  . calcium gluconate 650 MG tablet Take 650 mg by mouth daily.      . cholecalciferol (VITAMIN D) 1000 UNITS tablet Take 1,000 Units by mouth daily.      Marland Kitchen etodolac (LODINE) 400 MG tablet Take 400 mg by mouth daily.       . fluticasone (FLONASE) 50 MCG/ACT nasal spray Place 2 sprays into the nose daily as needed.      . fluticasone (FLOVENT HFA) 110 MCG/ACT inhaler Inhale 2 puffs into the lungs 2 (two) times daily as needed.       Marland Kitchen losartan (COZAAR) 100 MG tablet TAKE 1 TABLET BY MOUTH EVERY DAY  30 tablet  5  . methotrexate (RHEUMATREX) 2.5 MG tablet Take 2.5 mg by mouth once a week.       . Multiple Vitamin (MULTIVITAMIN) tablet Take 1 tablet by mouth daily.      Marland Kitchen omeprazole (PRILOSEC) 20 MG capsule TAKE ONE CAPSULE BY MOUTH EVERY DAY  30 capsule  5  . rosuvastatin (CRESTOR) 5 MG tablet Take one tablet every Monday, Wednesday, & Friday  15 tablet  2   No current facility-administered medications on file prior to visit.    Review of Systems No fever or chills.  No chest pain, tightness or palpitations.  No increased shortness of breath.  No cough or congestion.  No nausea or vomiting.  Increased pain and rash as outlined.      Objective:   Physical Exam  Filed Vitals:   10/06/14 0830  BP: 110/70  Pulse: 65  Temp: 98.2 F (55.71 C)   68 year old female in no acute distress.   SKIN:  Erythematous based rash with small vesicles that appears to be c/w shingles.          Assessment & Plan:  CAROTID BRUIT.  Carotid ultrasound revealed no significant stenosis.  This was 12/2012.  Refer to vascular surgery for reevaluation.    HEALTH MAINTENANCE.  Physical 04/22/14.   Bone density 10//12/11 revealed osteopenia.  Colonoscopy 09/18/06.  Was due 9/12.  Colonoscopy 09/23/14 - internal hemorrhoids otherwise normal. Recommend f/u colonoscopy in 5 years.   Mammogram 03/16/14 - Birads I.

## 2014-10-06 NOTE — Assessment & Plan Note (Signed)
Rash c/w shingles.  Valtrex 1000mg  tid x 1 week.  She does not feel she needs anything more for pain.  Follow.

## 2014-10-06 NOTE — Assessment & Plan Note (Signed)
Just saw Dr Lorretta Harp.  States everything checked out fine on carotid ultrasound.

## 2014-10-07 ENCOUNTER — Telehealth: Payer: Self-pay

## 2014-10-07 MED ORDER — GABAPENTIN 100 MG PO CAPS: 100.0000 mg | ORAL_CAPSULE | Freq: Three times a day (TID) | ORAL | Status: DC

## 2014-10-07 NOTE — Telephone Encounter (Signed)
Pt notified, verbalized understanding.

## 2014-10-07 NOTE — Telephone Encounter (Signed)
I sent in rx for gabapentin 100mg  tid.  I want her to start with one in the evening and then one bid and then tid if needed and if tolerated.  Let me know if any problems and if needs anything.

## 2014-10-07 NOTE — Telephone Encounter (Signed)
The patient called hoping to get pain medicine called in for her shingles pain

## 2014-10-14 ENCOUNTER — Encounter: Payer: Self-pay | Admitting: Internal Medicine

## 2014-11-01 ENCOUNTER — Other Ambulatory Visit: Payer: Self-pay | Admitting: Internal Medicine

## 2014-11-12 ENCOUNTER — Other Ambulatory Visit: Payer: Self-pay | Admitting: Internal Medicine

## 2014-12-21 ENCOUNTER — Encounter: Payer: Self-pay | Admitting: Internal Medicine

## 2014-12-21 ENCOUNTER — Ambulatory Visit (INDEPENDENT_AMBULATORY_CARE_PROVIDER_SITE_OTHER): Payer: Medicare Other | Admitting: Internal Medicine

## 2014-12-21 VITALS — BP 110/80 | HR 71 | Temp 98.2°F | Ht 61.5 in | Wt 113.5 lb

## 2014-12-21 DIAGNOSIS — B029 Zoster without complications: Secondary | ICD-10-CM

## 2014-12-21 DIAGNOSIS — E7800 Pure hypercholesterolemia, unspecified: Secondary | ICD-10-CM

## 2014-12-21 DIAGNOSIS — M069 Rheumatoid arthritis, unspecified: Secondary | ICD-10-CM

## 2014-12-21 DIAGNOSIS — R0989 Other specified symptoms and signs involving the circulatory and respiratory systems: Secondary | ICD-10-CM

## 2014-12-21 DIAGNOSIS — I10 Essential (primary) hypertension: Secondary | ICD-10-CM

## 2014-12-21 DIAGNOSIS — R42 Dizziness and giddiness: Secondary | ICD-10-CM

## 2014-12-21 DIAGNOSIS — E871 Hypo-osmolality and hyponatremia: Secondary | ICD-10-CM

## 2014-12-21 NOTE — Progress Notes (Signed)
Pre visit review using our clinic review tool, if applicable. No additional management support is needed unless otherwise documented below in the visit note. 

## 2014-12-21 NOTE — Progress Notes (Signed)
Subjective:    Patient ID: Erica Little, female    DOB: [birth year], 68 y.o.   MRN: [id number]  HPI 68 year old female with past history of rheumatoid arthritis (on Orencia), hypertension and hypercholesterolemia.  She comes in today for a scheduled follow up.   States she is doing well.  Denies any chest pain, tightness or sob.  States she is eating and drinking well.  Has lost weight overall.  Recent hand surgery.  Hand is doing well.  Seeing ortho.   Bowels stable.  Still seeing Dr Gavin Potters for her arthritis.   Blood pressure doing well.  Saw Dr Markham Jordan recently.  Had EGD and colonoscopy 09/23/14.  EGD with gastritis.  On prilosec.  Symptoms controlled.  She had an episode a few weeks ago, where she got up out of bed and was unsteady.  Had bilateral feet numbness.  Was on gabapentin and lodine.  Stopped these.  Has not had reoccurrence.  Discussed further w/up.  She declines.  At the time of the episode, had ears ringing.  No associated symptoms.    Past Medical History  Diagnosis Date  . Rheumatoid arthritis(714.0)     transaminitis on MTX, remicade rxn, s/p sulfasalazine, orencia  . Ulcer disease   . Hypertension   . Hyperlipidemia   . MRSA (methicillin resistant Staphylococcus aureus)     skin infections  . GERD (gastroesophageal reflux disease)     Current Outpatient Prescriptions on File Prior to Visit  Medication Sig Dispense Refill  . abatacept (ORENCIA) 250 MG injection Once every month    . albuterol (PROVENTIL HFA;VENTOLIN HFA) 108 (90 BASE) MCG/ACT inhaler Inhale 2 puffs into the lungs every 6 (six) hours as needed for wheezing or shortness of breath. 1 Inhaler 0  . amLODipine (NORVASC) 2.5 MG tablet TAKE 1 TABLET (2.5 MG TOTAL) BY MOUTH DAILY. 30 tablet 5  . calcium gluconate 650 MG tablet Take 650 mg by mouth daily.    . cholecalciferol (VITAMIN D) 1000 UNITS tablet Take 1,000 Units by mouth daily.    Marland Kitchen etodolac (LODINE) 400 MG tablet Take 400 mg by mouth daily.     .  fluticasone (FLONASE) 50 MCG/ACT nasal spray Place 2 sprays into the nose daily as needed.    . fluticasone (FLOVENT HFA) 110 MCG/ACT inhaler Inhale 2 puffs into the lungs 2 (two) times daily as needed.    . gabapentin (NEURONTIN) 100 MG capsule Take 1 capsule (100 mg total) by mouth 3 (three) times daily. 90 capsule 1  . losartan (COZAAR) 100 MG tablet TAKE 1 TABLET BY MOUTH EVERY DAY 30 tablet 5  . methotrexate (RHEUMATREX) 2.5 MG tablet Take 2.5 mg by mouth once a week.     . Multiple Vitamin (MULTIVITAMIN) tablet Take 1 tablet by mouth daily.    Marland Kitchen omeprazole (PRILOSEC) 20 MG capsule TAKE ONE CAPSULE BY MOUTH EVERY DAY 30 capsule 5  . rosuvastatin (CRESTOR) 5 MG tablet Take one tablet every Monday, Wednesday, & Friday 15 tablet 2  . valACYclovir (VALTREX) 1000 MG tablet Take 1 tablet (1,000 mg total) by mouth 3 (three) times daily. 21 tablet 0   No current facility-administered medications on file prior to visit.    Review of Systems Patient denies any headache, lightheadedness or dizziness currently.  Previous episode as outlined.  No fever or chills.  No chest pain, tightness or palpitations.  No increased shortness of breath.  No cough or congestion.  No nausea or vomiting.  No  abdominal pain or cramping.  No bowel change, such as diarrhea, constipation, BRBPR or melana.  No acid reflux.  No urine change.  Overall she feels she is doing relatively well.  S/p hand surgery and doing well.       Objective:   Physical Exam  Filed Vitals:   12/21/14 1053  BP: 110/80  Pulse: 71  Temp: 98.2 F (36.8 C)   Blood pressure recheck:  61/59  68 year old female in no acute distress.   HEENT:  Nares- clear.  Oropharynx - without lesions. NECK:  Supple.  Nontender.  HEART:  Appears to be regular. LUNGS:  No crackles or wheezing audible.  Respirations even and unlabored.  No cough with expiration or forced expiration.   RADIAL PULSE:  Equal bilaterally.  ABDOMEN:  Soft, nontender.  Bowel  sounds present and normal.  Femoral bruit vs lower abdominal bruit.    EXTREMITIES:  No increased edema present.  DP pulses palpable and equal bilaterally.          Assessment & Plan:  Essential hypertension Blood pressure doing well.  Follow.  Same medication regimen.    Rheumatoid arthritis Stable.  Seeing Dr Gavin Potters.    Hypercholesteremia Low cholesterol diet and exercise.  On crestor and tolerating.  Follow lipid panel and liver function tests.  Lab Results  Component Value Date   CHOL 198 09/21/2014   HDL 41.80 09/21/2014   LDLCALC 131* 09/21/2014   LDLDIRECT 167.8 12/20/2013   TRIG 127.0 09/21/2014   CHOLHDL 5 09/21/2014   Hyponatremia Sodium last check wnl.    Abdominal bruit Saw vascular surgery.  F/u aorta - 02/2015.    Carotid bruit, unspecified laterality See above.  Following with vascular surgery.    Herpes zoster Stable.  Doing well.  Pain improved.    Dizziness Had the episode as outlined.  No symptoms now.  Desires no further w/up.  Follow.    LYMPHADENOPATHY.  There were several hypoechoic masses noted on ultrasound.  (Felt to be consistent with lymph nodes).  Had the infection as outlined in previous note.  Minimal inguinal lymphadenopathy noted previously, but cannot appreciate on exam today.  Have discussed with her regarding further w/up and repeat ultrasound or scanning.  She declines.    CAROTID BRUIT.  Carotid ultrasound revealed no significant stenosis.  This was 12/2012.  Referred to vascular surgery for reevaluation.  States everything checked out fine.  Due f/u aortal ultrasound 02/2015.    INCREASED PSYCHOSOCIAL STRESSORS.  Feels she is doing better.  Eating well.  Follow.    PREVIOUS WEIGHT LOSS.  Weight as outlined.  Down some from last check.  Overall relatively stable from recent checks.  Follow.    HEALTH MAINTENANCE.  Physical 04/22/14.   Bone density 10//12/11 revealed osteopenia.  Colonoscopy 09/23/14 - internal hemorrhoids, otherwise  normal. .  Mammogram 03/16/14 - Birads I.     I spent 25 minutes with the patient and more than 50% of the time was spent in consultation regarding the above.

## 2014-12-26 DIAGNOSIS — R42 Dizziness and giddiness: Secondary | ICD-10-CM | POA: Insufficient documentation

## 2014-12-28 ENCOUNTER — Telehealth: Payer: Self-pay | Admitting: *Deleted

## 2014-12-28 NOTE — Telephone Encounter (Signed)
Spoke with pt advised of MDs message.  Pt scheduled

## 2014-12-28 NOTE — Telephone Encounter (Signed)
Pt called states she was unable to have her RA infusion due to her blood pressure being elevated.  She states it was BP 175/80 pulse 81.  Pt is requesting to be seen.

## 2014-12-28 NOTE — Telephone Encounter (Signed)
Please review previous message from Dr. Lorin Picket. Thanks

## 2014-12-28 NOTE — Telephone Encounter (Signed)
Spoke with pt, she states she is not having any other symptoms however her BP this morning was 159/80.

## 2014-12-28 NOTE — Telephone Encounter (Signed)
Has she checked her blood pressure since she went for her infusion.  It has not been elevated.  Is she having any other symptoms or problems?

## 2014-12-28 NOTE — Telephone Encounter (Signed)
I want her to continue to monitor her blood pressure so that we can see trend of readings.  Already appears to be improved.  Check and record.  Can call tomorrow with tomorrow's reading or if any problems.  I can see her at 12:00 on 01/03/15.

## 2015-01-03 ENCOUNTER — Encounter: Payer: Self-pay | Admitting: Internal Medicine

## 2015-01-03 ENCOUNTER — Ambulatory Visit (INDEPENDENT_AMBULATORY_CARE_PROVIDER_SITE_OTHER): Payer: Medicare Other | Admitting: Internal Medicine

## 2015-01-03 VITALS — BP 136/70 | HR 90 | Temp 97.9°F | Ht 61.5 in | Wt 116.0 lb

## 2015-01-03 DIAGNOSIS — R270 Ataxia, unspecified: Secondary | ICD-10-CM

## 2015-01-03 DIAGNOSIS — I10 Essential (primary) hypertension: Secondary | ICD-10-CM

## 2015-01-03 DIAGNOSIS — R51 Headache: Secondary | ICD-10-CM

## 2015-01-03 DIAGNOSIS — E7800 Pure hypercholesterolemia, unspecified: Secondary | ICD-10-CM

## 2015-01-03 DIAGNOSIS — M069 Rheumatoid arthritis, unspecified: Secondary | ICD-10-CM

## 2015-01-03 DIAGNOSIS — N289 Disorder of kidney and ureter, unspecified: Secondary | ICD-10-CM

## 2015-01-03 DIAGNOSIS — R519 Headache, unspecified: Secondary | ICD-10-CM

## 2015-01-03 LAB — BASIC METABOLIC PANEL
BUN: 13 mg/dL (ref 6–23)
CO2: 22 mEq/L (ref 19–32)
Calcium: 9.3 mg/dL (ref 8.4–10.5)
Chloride: 105 mEq/L (ref 96–112)
Creatinine, Ser: 1.1 mg/dL (ref 0.4–1.2)
GFR: 53.3 mL/min — ABNORMAL LOW (ref >60.00)
GLUCOSE: 97 mg/dL (ref 70–99)
POTASSIUM: 4.1 meq/L (ref 3.5–5.1)
Sodium: 136 mEq/L (ref 135–145)

## 2015-01-03 NOTE — Progress Notes (Signed)
Pre visit review using our clinic review tool, if applicable. No additional management support is needed unless otherwise documented below in the visit note. 

## 2015-01-05 ENCOUNTER — Ambulatory Visit: Payer: Self-pay | Admitting: Rheumatology

## 2015-01-07 ENCOUNTER — Other Ambulatory Visit: Payer: Self-pay | Admitting: Internal Medicine

## 2015-01-07 ENCOUNTER — Encounter: Payer: Self-pay | Admitting: Internal Medicine

## 2015-01-07 DIAGNOSIS — R6889 Other general symptoms and signs: Secondary | ICD-10-CM

## 2015-01-07 DIAGNOSIS — R51 Headache: Secondary | ICD-10-CM

## 2015-01-07 DIAGNOSIS — R519 Headache, unspecified: Secondary | ICD-10-CM | POA: Insufficient documentation

## 2015-01-07 DIAGNOSIS — R270 Ataxia, unspecified: Secondary | ICD-10-CM | POA: Insufficient documentation

## 2015-01-07 NOTE — Progress Notes (Signed)
Subjective:    Patient ID: Erica Little, female    DOB: 1946-06-11, 69 y.o.   MRN: [id number]  Hypertension  69 year old female with past history of rheumatoid arthritis (on Orencia), hypertension and hypercholesterolemia.  She comes in today as a work in with concerns regarding elevated blood pressure.  She was evaluated at Dr [place] office.  See his note for details.  Has MRI of brain scheduled in two days.   Blood pressure elevated.  Now that blood pressure is better, she feels better.  She describes a weakness in her feet and ankles.  no focal motor weakness.  Denies any chest pain, tightness or sob.  States she is eating and drinking well.  Recent hand surgery.  Hand is doing well.  Seeing ortho.   Bowels stable.    Past Medical History  Diagnosis Date  . Rheumatoid arthritis(714.0)     transaminitis on MTX, remicade rxn, s/p sulfasalazine, orencia  . Ulcer disease   . Hypertension   . Hyperlipidemia   . MRSA (methicillin resistant Staphylococcus aureus)     skin infections  . GERD (gastroesophageal reflux disease)     Current Outpatient Prescriptions on File Prior to Visit  Medication Sig Dispense Refill  . abatacept (ORENCIA) 250 MG injection Once every month    . albuterol (PROVENTIL HFA;VENTOLIN HFA) 108 (90 BASE) MCG/ACT inhaler Inhale 2 puffs into the lungs every 6 (six) hours as needed for wheezing or shortness of breath. 1 Inhaler 0  . amLODipine (NORVASC) 2.5 MG tablet TAKE 1 TABLET (2.5 MG TOTAL) BY MOUTH DAILY. 30 tablet 5  . calcium gluconate 650 MG tablet Take 650 mg by mouth daily.    . cholecalciferol (VITAMIN D) 1000 UNITS tablet Take 1,000 Units by mouth daily.    Marland Kitchen etodolac (LODINE) 400 MG tablet Take 400 mg by mouth daily.     . fluticasone (FLOVENT HFA) 110 MCG/ACT inhaler Inhale 2 puffs into the lungs 2 (two) times daily as needed.    . gabapentin (NEURONTIN) 100 MG capsule Take 1 capsule (100 mg total) by mouth 3 (three) times daily. 90 capsule 1  .  losartan (COZAAR) 100 MG tablet TAKE 1 TABLET BY MOUTH EVERY DAY 30 tablet 5  . methotrexate (RHEUMATREX) 2.5 MG tablet Take 2.5 mg by mouth once a week.     . rosuvastatin (CRESTOR) 5 MG tablet Take one tablet every Monday, Wednesday, & Friday 15 tablet 2  . fluticasone (FLONASE) 50 MCG/ACT nasal spray Place 2 sprays into the nose daily as needed.    . Multiple Vitamin (MULTIVITAMIN) tablet Take 1 tablet by mouth daily.    Marland Kitchen omeprazole (PRILOSEC) 20 MG capsule TAKE ONE CAPSULE BY MOUTH EVERY DAY (Patient not taking: Reported on 01/03/2015) 30 capsule 5  . valACYclovir (VALTREX) 1000 MG tablet Take 1 tablet (1,000 mg total) by mouth 3 (three) times daily. (Patient not taking: Reported on 01/03/2015) 21 tablet 0   No current facility-administered medications on file prior to visit.    Review of Systems Patient denies any headache, lightheadedness or dizziness currently.  Previous episode as outlined.  Symptoms have improved and blood pressure better.  No fever or chills.  No chest pain, tightness or palpitations.  No increased shortness of breath.  No cough or congestion.  No nausea or vomiting.  No abdominal pain or cramping.  No bowel change, such as diarrhea, constipation, BRBPR or melana.  No acid reflux.  No urine change.  Objective:   Physical Exam  Filed Vitals:   01/03/15 1104  BP: 136/70  Pulse: 90  Temp: 97.9 F (99.31 C)   69 year old female in no acute distress.   HEENT:  Nares- clear.  Oropharynx - without lesions. NECK:  Supple.  Nontender.  HEART:  Appears to be regular. LUNGS:  No crackles or wheezing audible.  Respirations even and unlabored.  No cough with expiration or forced expiration.   RADIAL PULSE:  Equal bilaterally.  ABDOMEN:  Soft, nontender.  Bowel sounds present and normal.  Femoral bruit vs lower abdominal bruit.    EXTREMITIES:  No increased edema present.  DP pulses palpable and equal bilaterally.   NEURO:  Sensation intact feet and bilateral lower  extremities.          Assessment & Plan:  1. Renal insufficiency Cr slightly increased on recent check.  Stay hydrated.   - Basic metabolic panel  2. Essential hypertension Blood pressure doing well now.  On recheck 130s systolic.  Same medication regimen.  Follow.    3. Rheumatoid arthritis Seeing Dr Gavin Potters.    4. Hypercholesteremia Low cholesterol diet and exercise.  On crestor.    5. Ataxia Had the episode as outlined.  Symptoms better.  Still with some change of sensation in her feet and ankles.  Planning for MRI in two days.  Discussed with her today.  Refer to neurology for further evaluation.    6. Pressure in head Symptoms improved.  Blood pressure better.  MRI planned as outlined.  Will refer to neurology.    7. LYMPHADENOPATHY.  There were several hypoechoic masses noted on ultrasound.  (Felt to be consistent with lymph nodes).  Had the infection as outlined in previous note.  Minimal inguinal lymphadenopathy noted previously, but cannot appreciate on exam today.  Have discussed with her regarding further w/up and repeat ultrasound or scanning.  She declines.    8. CAROTID BRUIT.  Carotid ultrasound revealed no significant stenosis.  This was 12/2012.  Referred to vascular surgery for reevaluation.  States everything checked out fine.  Due f/u aortal ultrasound 02/2015.    9. INCREASED PSYCHOSOCIAL STRESSORS.  Feels she is doing better.  Eating well.  Follow.    10. PREVIOUS WEIGHT LOSS.  Weight as outlined.  Down some from last check.  Overall relatively stable from recent checks.  Follow.    HEALTH MAINTENANCE.  Physical 04/22/14.   Bone density 10//12/11 revealed osteopenia.  Colonoscopy 09/23/14 - internal hemorrhoids, otherwise normal. .  Mammogram 03/16/14 - Birads I.     I spent 25 minutes with the patient and more than 50% of the time was spent in consultation regarding the above.

## 2015-01-07 NOTE — Progress Notes (Signed)
Neurology referral ordered.

## 2015-01-26 ENCOUNTER — Other Ambulatory Visit: Payer: Self-pay | Admitting: Internal Medicine

## 2015-01-29 ENCOUNTER — Encounter: Payer: Self-pay | Admitting: Internal Medicine

## 2015-01-29 DIAGNOSIS — Z8371 Family history of colonic polyps: Secondary | ICD-10-CM | POA: Insufficient documentation

## 2015-02-03 ENCOUNTER — Encounter: Payer: Self-pay | Admitting: Internal Medicine

## 2015-03-22 ENCOUNTER — Other Ambulatory Visit: Payer: Self-pay

## 2015-03-22 DIAGNOSIS — Z1231 Encounter for screening mammogram for malignant neoplasm of breast: Secondary | ICD-10-CM

## 2015-04-30 ENCOUNTER — Other Ambulatory Visit: Payer: Self-pay | Admitting: Internal Medicine

## 2015-05-02 ENCOUNTER — Ambulatory Visit
Admission: RE | Admit: 2015-05-02 | Discharge: 2015-05-02 | Disposition: A | Discharge status: Home / Self Care | Payer: Medicare Other | Source: Ambulatory Visit

## 2015-05-02 DIAGNOSIS — Z1231 Encounter for screening mammogram for malignant neoplasm of breast: Secondary | ICD-10-CM

## 2015-05-03 ENCOUNTER — Encounter: Payer: Self-pay | Admitting: Internal Medicine

## 2015-05-03 DIAGNOSIS — Z0000 Encounter for general adult medical examination without abnormal findings: Secondary | ICD-10-CM | POA: Insufficient documentation

## 2015-05-08 ENCOUNTER — Encounter: Payer: Self-pay | Admitting: Internal Medicine

## 2015-05-08 ENCOUNTER — Ambulatory Visit (INDEPENDENT_AMBULATORY_CARE_PROVIDER_SITE_OTHER): Payer: Medicare Other | Admitting: Internal Medicine

## 2015-05-08 VITALS — BP 128/80 | HR 68 | Temp 98.2°F | Ht 60.0 in | Wt 115.0 lb

## 2015-05-08 DIAGNOSIS — E7800 Pure hypercholesterolemia, unspecified: Secondary | ICD-10-CM

## 2015-05-08 DIAGNOSIS — E2839 Other primary ovarian failure: Secondary | ICD-10-CM

## 2015-05-08 DIAGNOSIS — I10 Essential (primary) hypertension: Secondary | ICD-10-CM | POA: Diagnosis not present

## 2015-05-08 DIAGNOSIS — R0989 Other specified symptoms and signs involving the circulatory and respiratory systems: Secondary | ICD-10-CM

## 2015-05-08 DIAGNOSIS — K2970 Gastritis, unspecified, without bleeding: Secondary | ICD-10-CM

## 2015-05-08 DIAGNOSIS — R0789 Other chest pain: Secondary | ICD-10-CM

## 2015-05-08 DIAGNOSIS — M069 Rheumatoid arthritis, unspecified: Secondary | ICD-10-CM

## 2015-05-08 DIAGNOSIS — Z83719 Family history of colon polyps, unspecified: Secondary | ICD-10-CM

## 2015-05-08 DIAGNOSIS — Z0000 Encounter for general adult medical examination without abnormal findings: Secondary | ICD-10-CM

## 2015-05-08 DIAGNOSIS — Z8371 Family history of colonic polyps: Secondary | ICD-10-CM

## 2015-05-08 DIAGNOSIS — R270 Ataxia, unspecified: Secondary | ICD-10-CM

## 2015-05-08 NOTE — Progress Notes (Signed)
Pre visit review using our clinic review tool, if applicable. No additional management support is needed unless otherwise documented below in the visit note. 

## 2015-05-08 NOTE — Progress Notes (Signed)
Patient ID: Erica Little, female   DOB: 1946-01-19, 69 y.o.   MRN: [id number]   Subjective:    Patient ID: Erica Little, female    DOB: 1946/08/09, 69 y.o.   MRN: [id number]  HPI  Patient here to follow up on her current medical issues as well as for her physical.  She has had no further episodes of unsteadiness.  Saw Dr Sherryll Burger.  Had negative EEG.  MRI revealed no acute changes.  Tries to stay active.  No cardiac symptoms with increased activity or exertion.  Breathing stable.  No sob.  Eating and drinking well.  Bowels stable.  Joints doing well.  Doing well s/p her right hand surgery.  Seeing Dr Gavin Potters.  Discussed the need for bone density.    Past Medical History  Diagnosis Date  . Rheumatoid arthritis(714.0)     transaminitis on MTX, remicade rxn, s/p sulfasalazine, orencia  . Ulcer disease   . Hypertension   . Hyperlipidemia   . MRSA (methicillin resistant Staphylococcus aureus)     skin infections  . GERD (gastroesophageal reflux disease)     Current Outpatient Prescriptions on File Prior to Visit  Medication Sig Dispense Refill  . abatacept (ORENCIA) 250 MG injection Once every month    . albuterol (PROVENTIL HFA;VENTOLIN HFA) 108 (90 BASE) MCG/ACT inhaler Inhale 2 puffs into the lungs every 6 (six) hours as needed for wheezing or shortness of breath. 1 Inhaler 0  . amLODipine (NORVASC) 2.5 MG tablet TAKE 1 TABLET (2.5 MG TOTAL) BY MOUTH DAILY. 30 tablet 5  . calcium gluconate 650 MG tablet Take 650 mg by mouth daily.    . cholecalciferol (VITAMIN D) 1000 UNITS tablet Take 1,000 Units by mouth daily.    Marland Kitchen etodolac (LODINE) 400 MG tablet Take 400 mg by mouth daily.     . fluticasone (FLONASE) 50 MCG/ACT nasal spray Place 2 sprays into the nose daily as needed.    . fluticasone (FLOVENT HFA) 110 MCG/ACT inhaler Inhale 2 puffs into the lungs 2 (two) times daily as needed.    . gabapentin (NEURONTIN) 100 MG capsule Take 1 capsule (100 mg total) by mouth 3 (three) times daily. 90  capsule 1  . methotrexate (RHEUMATREX) 2.5 MG tablet Take 2.5 mg by mouth once a week.     . Multiple Vitamin (MULTIVITAMIN) tablet Take 1 tablet by mouth daily.    Marland Kitchen omeprazole (PRILOSEC) 20 MG capsule TAKE ONE CAPSULE BY MOUTH EVERY DAY 30 capsule 5  . rosuvastatin (CRESTOR) 5 MG tablet Take one tablet every Monday, Wednesday, & Friday 15 tablet 2  . valACYclovir (VALTREX) 1000 MG tablet Take 1 tablet (1,000 mg total) by mouth 3 (three) times daily. 21 tablet 0   No current facility-administered medications on file prior to visit.    Review of Systems  Constitutional: Negative for appetite change and unexpected weight change.  HENT: Negative for congestion and sinus pressure.   Eyes: Negative for pain and visual disturbance.  Respiratory: Negative for cough, chest tightness and shortness of breath.   Cardiovascular: Negative for chest pain, palpitations and leg swelling.  Gastrointestinal: Negative for nausea, abdominal pain and diarrhea.  Genitourinary: Negative for dysuria and difficulty urinating.  Musculoskeletal: Negative for back pain.       Sees Dr Gavin Potters for her joints.  Stable.  On Orencia.   Skin: Negative for color change and rash.  Neurological: Negative for dizziness, light-headedness and headaches.  Hematological: Negative for adenopathy. Does not bruise/bleed  easily.  Psychiatric/Behavioral: Negative for dysphoric mood and agitation.       Objective:   blood pressure recheck:  138/74  Physical Exam  Constitutional: She is oriented to person, place, and time. She appears well-developed and well-nourished.  HENT:  Nose: Nose normal.  Mouth/Throat: Oropharynx is clear and moist.  Eyes: Right eye exhibits no discharge. Left eye exhibits no discharge. No scleral icterus.  Neck: Neck supple. No thyromegaly present.  Cardiovascular: Normal rate and regular rhythm.   Bilateral carotid bruit.   Pulmonary/Chest: Breath sounds normal. No accessory muscle usage. No  tachypnea. No respiratory distress. She has no decreased breath sounds. She has no wheezes. She has no rhonchi. Right breast exhibits no inverted nipple, no mass, no nipple discharge and no tenderness (no axillary adenopathy). Left breast exhibits no inverted nipple, no mass, no nipple discharge and no tenderness (no axilarry adenopathy).  Some anterior chest wall fullness.  No nodule.  No mass.  Diffuse fullness on right when compared to left.    Abdominal: Soft. Bowel sounds are normal. There is no tenderness.  Musculoskeletal: She exhibits no edema or tenderness.  Lymphadenopathy:    She has no cervical adenopathy.  Neurological: She is alert and oriented to person, place, and time.  Skin: Skin is warm. No rash noted.  Psychiatric: She has a normal mood and affect. Her behavior is normal.    BP 128/80 mmHg  Pulse 68  Temp(Src) 98.2 F (36.8 C) (Oral)  Ht 5' (1.524 m)  Wt 115 lb (52.164 kg)  BMI 22.46 kg/m2  SpO2 98% Wt Readings from Last 3 Encounters:  05/08/15 115 lb (52.164 kg)  01/03/15 116 lb (52.617 kg)  12/21/14 113 lb 8 oz (51.483 kg)     Lab Results  Component Value Date   WBC 5.8 09/21/2014   HGB 12.1 09/21/2014   HCT 37.4 09/21/2014   PLT 198.0 09/21/2014   GLUCOSE 97 01/03/2015   CHOL 198 09/21/2014   TRIG 127.0 09/21/2014   HDL 41.80 09/21/2014   LDLDIRECT 167.8 12/20/2013   LDLCALC 131* 09/21/2014   ALT 12 09/21/2014   AST 23 09/21/2014   NA 136 01/03/2015   K 4.1 01/03/2015   CL 105 01/03/2015   CREATININE 1.1 01/03/2015   BUN 13 01/03/2015   CO2 22 01/03/2015   TSH 0.73 09/21/2014   INR 1.1* 12/06/2013    Mm Screening Breast Tomo Bilateral  05/03/2015   CLINICAL DATA:  Screening.  EXAM: DIGITAL SCREENING BILATERAL MAMMOGRAM WITH 3D TOMO WITH CAD  COMPARISON:  Previous exam(s).  ACR Breast Density Category b: There are scattered areas of fibroglandular density.  FINDINGS: There are no findings suspicious for malignancy. Images were processed with  CAD.  IMPRESSION: No mammographic evidence of malignancy. A result letter of this screening mammogram will be mailed directly to the patient.  RECOMMENDATION: Screening mammogram in one year. (Code:SM-B-01Y)  BI-RADS CATEGORY  1: Negative.   Electronically Signed   By: Annia Belt M.D.   On: 05/03/2015 11:26       Assessment & Plan:   Problem List Items Addressed This Visit    Ataxia    No reoccurrence.  Saw neurology.  MRI unrevealing.        Carotid bruit    Seeing Dr Lorretta Harp.  Is following with carotid ultrasound.        Chest fullness   Family history of colonic polyps    Colonoscopy 09/23/14 - internal hemorrhoids.  Recommended f/u colonoscopy in  08/2019.       Gastritis    EGD 09/23/14 - gastritis otherwise normal.  Currently asymptomatic.        Health care maintenance    PAP 04/22/14 - negative with negative HPV.  mammogram 05/03/15 - Birads I.   Colonoscopy 08/2014.  Recommended f/u in 08/2019.  Schedule bone density.        Hypercholesteremia    Low cholesterol diet and exercise.  Follow lipid panel and liver function tests.  Continue crestor.        Hypertension    Blood pressure doing well.  Same medication regimen.  Follow pressures.  Follow metabolic panel.        Rheumatoid arthritis    Followed by Dr Gavin Potters.  Is s/p hand surgery.  Doing well.         Other Visit Diagnoses    Estrogen deficiency    -  Primary    Relevant Orders    DG Bone Density      I spent 25 minutes with the patient and more than 50% of the time was spent in consultation regarding the above.     Dale Index, MD

## 2015-05-10 ENCOUNTER — Other Ambulatory Visit: Payer: Self-pay | Admitting: Internal Medicine

## 2015-05-14 ENCOUNTER — Encounter: Payer: Self-pay | Admitting: Internal Medicine

## 2015-05-14 NOTE — Assessment & Plan Note (Signed)
Followed by Dr Gavin Potters.  Is s/p hand surgery.  Doing well.

## 2015-05-14 NOTE — Assessment & Plan Note (Addendum)
PAP 04/22/14 - negative with negative HPV.  mammogram 05/03/15 - Birads I.   Colonoscopy 08/2014.  Recommended f/u in 08/2019.  Schedule bone density.

## 2015-05-14 NOTE — Assessment & Plan Note (Signed)
Blood pressure doing well.  Same medication regimen.  Follow pressures.  Follow metabolic panel.   

## 2015-05-14 NOTE — Assessment & Plan Note (Signed)
Colonoscopy 09/23/14 - internal hemorrhoids.  Recommended f/u colonoscopy in 08/2019.

## 2015-05-14 NOTE — Assessment & Plan Note (Signed)
Seeing Dr Lorretta Harp.  Is following with carotid ultrasound.

## 2015-05-14 NOTE — Assessment & Plan Note (Signed)
EGD 09/23/14 - gastritis otherwise normal.  Currently asymptomatic.    

## 2015-05-14 NOTE — Assessment & Plan Note (Signed)
Low cholesterol diet and exercise.  Follow lipid panel and liver function tests.  Continue crestor.  

## 2015-05-14 NOTE — Assessment & Plan Note (Signed)
No reoccurrence.  Saw neurology.  MRI unrevealing.

## 2015-05-16 LAB — HM DEXA SCAN

## 2015-05-17 ENCOUNTER — Encounter: Payer: Self-pay | Admitting: Internal Medicine

## 2015-05-23 LAB — LIPID PANEL
Cholesterol: 189 mg/dL (ref 0–200)
HDL: 40 mg/dL (ref 35–70)
Triglycerides: 174 mg/dL — AB (ref 40–160)

## 2015-05-23 LAB — HEPATIC FUNCTION PANEL
ALT: 11 U/L (ref 7–35)
AST: 15 U/L (ref 13–35)

## 2015-05-23 LAB — CBC AND DIFFERENTIAL
HEMATOCRIT: 39 % (ref 36–46)
Hemoglobin: 12.4 g/dL (ref 12.0–16.0)
PLATELETS: 223 10*3/uL (ref 150–399)
WBC: 5.8 10*3/mL

## 2015-05-24 ENCOUNTER — Encounter: Payer: Self-pay | Admitting: Internal Medicine

## 2015-05-31 ENCOUNTER — Encounter: Payer: Self-pay | Admitting: *Deleted

## 2015-06-06 ENCOUNTER — Ambulatory Visit: Payer: Medicare Other

## 2015-06-08 ENCOUNTER — Other Ambulatory Visit: Payer: Self-pay | Admitting: *Deleted

## 2015-06-08 MED ORDER — LOSARTAN POTASSIUM 100 MG PO TABS: 100.0000 mg | ORAL_TABLET | Freq: Every day | ORAL | Status: DC

## 2015-06-08 MED ORDER — AMLODIPINE BESYLATE 2.5 MG PO TABS: ORAL_TABLET | ORAL | Status: DC

## 2015-06-08 NOTE — Telephone Encounter (Signed)
Fax from pharmacy, requesting 90 day supply 

## 2015-07-12 ENCOUNTER — Other Ambulatory Visit: Payer: Self-pay | Admitting: Internal Medicine

## 2015-07-19 ENCOUNTER — Other Ambulatory Visit: Payer: Self-pay | Admitting: Internal Medicine

## 2015-11-08 ENCOUNTER — Ambulatory Visit: Payer: Medicare Other | Admitting: Internal Medicine

## 2015-11-18 ENCOUNTER — Emergency Department
Admission: EM | Admit: 2015-11-18 | Discharge: 2015-11-19 | Disposition: A | Discharge status: Home / Self Care | Payer: Medicare Other | Attending: Emergency Medicine | Admitting: Emergency Medicine

## 2015-11-18 ENCOUNTER — Encounter: Payer: Self-pay | Admitting: Emergency Medicine

## 2015-11-18 ENCOUNTER — Emergency Department: Payer: Medicare Other

## 2015-11-18 DIAGNOSIS — Z79899 Other long term (current) drug therapy: Secondary | ICD-10-CM | POA: Diagnosis not present

## 2015-11-18 DIAGNOSIS — I10 Essential (primary) hypertension: Secondary | ICD-10-CM | POA: Diagnosis not present

## 2015-11-18 DIAGNOSIS — R05 Cough: Secondary | ICD-10-CM | POA: Diagnosis present

## 2015-11-18 DIAGNOSIS — J209 Acute bronchitis, unspecified: Secondary | ICD-10-CM | POA: Insufficient documentation

## 2015-11-18 DIAGNOSIS — Z87891 Personal history of nicotine dependence: Secondary | ICD-10-CM | POA: Diagnosis not present

## 2015-11-18 LAB — CBC
HEMATOCRIT: 39.9 % (ref 35.0–47.0)
HEMOGLOBIN: 12.8 g/dL (ref 12.0–16.0)
MCH: 27.2 pg (ref 26.0–34.0)
MCHC: 32 g/dL (ref 32.0–36.0)
MCV: 84.9 fL (ref 80.0–100.0)
Platelets: 182 10*3/uL (ref 150–440)
RBC: 4.7 MIL/uL (ref 3.80–5.20)
RDW: 18.4 % — ABNORMAL HIGH (ref 11.5–14.5)
WBC: 8.2 10*3/uL (ref 3.6–11.0)

## 2015-11-18 MED ORDER — IPRATROPIUM-ALBUTEROL 0.5-2.5 (3) MG/3ML IN SOLN
3.0000 mL | Freq: Once | RESPIRATORY_TRACT | Status: AC
  Administered 2015-11-18: 3 mL via RESPIRATORY_TRACT
  Filled 2015-11-18: qty 3

## 2015-11-18 MED ORDER — AZITHROMYCIN 250 MG PO TABS: ORAL_TABLET | ORAL | Status: AC

## 2015-11-18 MED ORDER — ALBUTEROL SULFATE HFA 108 (90 BASE) MCG/ACT IN AERS: 2.0000 | INHALATION_SPRAY | Freq: Four times a day (QID) | RESPIRATORY_TRACT | Status: DC | PRN

## 2015-11-18 MED ORDER — AZITHROMYCIN 250 MG PO TABS
500.0000 mg | ORAL_TABLET | Freq: Once | ORAL | Status: AC
  Administered 2015-11-18: 500 mg via ORAL
  Filled 2015-11-18: qty 2

## 2015-11-18 MED ORDER — PREDNISONE 20 MG PO TABS: 40.0000 mg | ORAL_TABLET | Freq: Every day | ORAL | Status: DC

## 2015-11-18 MED ORDER — PREDNISONE 20 MG PO TABS
60.0000 mg | ORAL_TABLET | Freq: Once | ORAL | Status: AC
  Administered 2015-11-18: 60 mg via ORAL
  Filled 2015-11-18: qty 3

## 2015-11-18 NOTE — ED Provider Notes (Signed)
[facility] Emergency Department Provider Note  Time seen: 11:23 PM  I have reviewed the triage vital signs and the nursing notes.   HISTORY  Chief Complaint Cough and Shortness of Breath    HPI Erica Little is a 69 y.o. female with a past medical history of rheumatoid arthritis, hypertension, hyperlipidemia, who presents the emergency department with cough and congestion. According to the patient for the past 6 months she has had a cough.She states for the past several days the cough has worsened and she now feels short of breath. Denies any sputum production with the cough. Denies any fever at home. Describes her cough and shortness of breath is mild to moderate. Denies chest pain.     Past Medical History  Diagnosis Date  . Rheumatoid arthritis(714.0)     transaminitis on MTX, remicade rxn, s/p sulfasalazine, orencia  . Ulcer disease   . Hypertension   . Hyperlipidemia   . MRSA (methicillin resistant Staphylococcus aureus)     skin infections  . GERD (gastroesophageal reflux disease)     Patient Active Problem List   Diagnosis Date Noted  . Chest fullness 05/08/2015  . Health care maintenance 05/03/2015  . Family history of colonic polyps 01/29/2015  . Ataxia 01/07/2015  . Pressure in head 01/07/2015  . Dizziness 12/26/2014  . Herpes zoster 10/06/2014  . Gastritis 10/06/2014  . Abdominal bruit 09/21/2014  . Carotid bruit 09/21/2014  . Vaginal irritation 08/14/2014  . Cough 04/24/2014  . Pain of left calf 12/11/2013  . Osteopenia 01/11/2013  . Rheumatoid arthritis (HCC) 12/17/2012  . Hypertension 10/11/2012  . Hypercholesteremia 10/11/2012  . Hyponatremia 10/11/2012    Past Surgical History  Procedure Laterality Date  . Cholecystectomy  2010  . Mcp replacements  2001    left  . Tubal ligation      Current Outpatient Rx  Name  Route  Sig  Dispense  Refill  . abatacept (ORENCIA) 250 MG injection      Once every month          . albuterol (PROVENTIL HFA;VENTOLIN HFA) 108 (90 BASE) MCG/ACT inhaler   Inhalation   Inhale 2 puffs into the lungs every 6 (six) hours as needed for wheezing or shortness of breath.   1 Inhaler   0   . amLODipine (NORVASC) 2.5 MG tablet      TAKE 1 TABLET (2.5 MG TOTAL) BY MOUTH DAILY.   90 tablet   1   . calcium gluconate 650 MG tablet   Oral   Take 650 mg by mouth daily.         . cholecalciferol (VITAMIN D) 1000 UNITS tablet   Oral   Take 1,000 Units by mouth daily.         . CRESTOR 5 MG tablet      TAKE 1 TABLET (5 MG TOTAL) BY MOUTH DAILY.   30 tablet   5   . etodolac (LODINE) 400 MG tablet   Oral   Take 400 mg by mouth daily.          . fluticasone (FLONASE) 50 MCG/ACT nasal spray   Nasal   Place 2 sprays into the nose daily as needed.         . fluticasone (FLOVENT HFA) 110 MCG/ACT inhaler   Inhalation   Inhale 2 puffs into the lungs 2 (two) times daily as needed.         . gabapentin (NEURONTIN) 100 MG capsule  Oral   Take 1 capsule (100 mg total) by mouth 3 (three) times daily.   90 capsule   1   . losartan (COZAAR) 100 MG tablet   Oral   Take 1 tablet (100 mg total) by mouth daily.   90 tablet   1   . magnesium oxide (MAG-OX) 400 MG tablet   Oral   Take 400 mg by mouth daily.         . methotrexate (RHEUMATREX) 2.5 MG tablet   Oral   Take 2.5 mg by mouth once a week.          . Multiple Vitamin (MULTIVITAMIN) tablet   Oral   Take 1 tablet by mouth daily.         Marland Kitchen omeprazole (PRILOSEC) 20 MG capsule      TAKE ONE CAPSULE BY MOUTH EVERY DAY   30 capsule   5   . rosuvastatin (CRESTOR) 5 MG tablet      Take one tablet every Monday, Wednesday, & Friday   15 tablet   2     Please fill this Rx instead of the #30 previously  ...   . valACYclovir (VALTREX) 1000 MG tablet   Oral   Take 1 tablet (1,000 mg total) by mouth 3 (three) times daily.   21 tablet   0     Allergies Remicade; Simvastatin; Sulfa  antibiotics; and Zetia  Family History  Problem Relation Age of Onset  . Heart disease Mother   . Diabetes Mother   . Hypercholesterolemia Mother   . Heart disease Father   . Heart disease Brother     MI age 31  . Hypercholesterolemia Sister   . Colon polyps Sister   . Breast cancer Neg Hx   . Arthritis/Rheumatoid Paternal Aunt   . Arthritis/Rheumatoid Paternal Uncle     Social History Social History  Substance Use Topics  . Smoking status: Former Smoker    Quit date: 12/30/1988  . Smokeless tobacco: Never Used  . Alcohol Use: No    Review of Systems Constitutional: Negative for fever. Cardiovascular: Negative for chest pain. Respiratory: Positive for cough and shortness of breath. Gastrointestinal: Negative for abdominal pain Musculoskeletal: Negative for back pain. Neurological: Negative for headache 10-point ROS otherwise negative.  ____________________________________________   PHYSICAL EXAM:  VITAL SIGNS: ED Triage Vitals  Enc Vitals Group     BP 11/18/15 2140 150/77 mmHg     Pulse Rate 11/18/15 2140 108     Resp 11/18/15 2140 22     Temp 11/18/15 2140 100 F (37.8 C)     Temp Source 11/18/15 2140 Oral     SpO2 11/18/15 2140 96 %     Weight 11/18/15 2140 115 lb (52.164 kg)     Height 11/18/15 2140 5' (1.524 m)     Head Cir --      Peak Flow --      Pain Score 11/18/15 2202 0     Pain Loc --      Pain Edu? --      Excl. in GC? --    Constitutional: Alert and oriented. Well appearing and in no distress. Eyes: Normal exam ENT   Head: Normocephalic and atraumatic.   Mouth/Throat: Mucous membranes are moist. Cardiovascular: Normal rate, regular rhythm. No murmur Respiratory: Normal respiratory effort without tachypnea nor retractions. Breath sounds are clear and equal bilaterally. No wheezes/rales/rhonchi. Moderate cough on exam. Gastrointestinal: Soft and nontender. No distention.   Musculoskeletal: Nontender  with normal range of motion in all  extremities.  Neurologic:  Normal speech and language. No gross focal neurologic deficits  Skin:  Skin is warm, dry and intact.  Psychiatric: Mood and affect are normal. Speech and behavior are normal.  ____________________________________________    EKG  EKG didn't interpreted by myself shows sinus rhythm at 97 bpm, narrow QRS, normal axis, normal intervals, no ST changes noted. Overall normal EKG.  ____________________________________________    RADIOLOGY  Chest x-ray shows no acute findings  ____________________________________________    INITIAL IMPRESSION / ASSESSMENT AND PLAN / ED COURSE  Pertinent labs & imaging results that were available during my care of the patient were reviewed by me and considered in my medical decision making (see chart for details).  Patient is 6 months of dry cough, worse over the past 3 days along with shortness of breath. Exam and history most suggestive of acute bronchitis. Chest x-ray is negative. Currently awaiting lab results. Patient does have a temperature to 100.0 in the emergency department. Normal oxygen saturation. We will treat with DuoNeb's, steroids, and Zithromax. Overall the patient appears very well, and is comfortable going home for a trial of outpatient therapy. Patient will follow up with her primary care physician.  Patient care signed out to Dr. Zenda Alpers, labs pending.  ____________________________________________   FINAL CLINICAL IMPRESSION(S) / ED DIAGNOSES  Acute bronchitis   Minna Antis, MD 11/18/15 2326

## 2015-11-18 NOTE — ED Notes (Signed)
Patient transported to X-ray via stretcher 

## 2015-11-18 NOTE — ED Notes (Signed)
Patient report that she has had a cough for 6 months and today she started becoming short of breath.

## 2015-11-18 NOTE — Discharge Instructions (Signed)
Please follow-up your primary care physician in the next 2 days for recheck. Please take your antibiotics and steroids as prescribed. Return to the emergency department for any worsening cough, congestion, shortness breath, or development of any chest pain.    Acute Bronchitis Bronchitis is inflammation of the airways that extend from the windpipe into the lungs (bronchi). The inflammation often causes mucus to develop. This leads to a cough, which is the most common symptom of bronchitis.  In acute bronchitis, the condition usually develops suddenly and goes away over time, usually in a couple weeks. Smoking, allergies, and asthma can make bronchitis worse. Repeated episodes of bronchitis may cause further lung problems.  CAUSES Acute bronchitis is most often caused by the same virus that causes a cold. The virus can spread from person to person (contagious) through coughing, sneezing, and touching contaminated objects. SIGNS AND SYMPTOMS   Cough.   Fever.   Coughing up mucus.   Body aches.   Chest congestion.   Chills.   Shortness of breath.   Sore throat.  DIAGNOSIS  Acute bronchitis is usually diagnosed through a physical exam. Your health care provider will also ask you questions about your medical history. Tests, such as chest X-rays, are sometimes done to rule out other conditions.  TREATMENT  Acute bronchitis usually goes away in a couple weeks. Oftentimes, no medical treatment is necessary. Medicines are sometimes given for relief of fever or cough. Antibiotic medicines are usually not needed but may be prescribed in certain situations. In some cases, an inhaler may be recommended to help reduce shortness of breath and control the cough. A cool mist vaporizer may also be used to help thin bronchial secretions and make it easier to clear the chest.  HOME CARE INSTRUCTIONS  Get plenty of rest.   Drink enough fluids to keep your urine clear or pale yellow (unless you  have a medical condition that requires fluid restriction). Increasing fluids may help thin your respiratory secretions (sputum) and reduce chest congestion, and it will prevent dehydration.   Take medicines only as directed by your health care provider.  If you were prescribed an antibiotic medicine, finish it all even if you start to feel better.  Avoid smoking and secondhand smoke. Exposure to cigarette smoke or irritating chemicals will make bronchitis worse. If you are a smoker, consider using nicotine gum or skin patches to help control withdrawal symptoms. Quitting smoking will help your lungs heal faster.   Reduce the chances of another bout of acute bronchitis by washing your hands frequently, avoiding people with cold symptoms, and trying not to touch your hands to your mouth, nose, or eyes.   Keep all follow-up visits as directed by your health care provider.  SEEK MEDICAL CARE IF: Your symptoms do not improve after 1 week of treatment.  SEEK IMMEDIATE MEDICAL CARE IF:  You develop an increased fever or chills.   You have chest pain.   You have severe shortness of breath.  You have bloody sputum.   You develop dehydration.  You faint or repeatedly feel like you are going to pass out.  You develop repeated vomiting.  You develop a severe headache. MAKE SURE YOU:   Understand these instructions.  Will watch your condition.  Will get help right away if you are not doing well or get worse.   This information is not intended to replace advice given to you by your health care provider. Make sure you discuss any questions you have  with your health care provider.   Document Released: 01/23/2005 Document Revised: 01/06/2015 Document Reviewed: 06/08/2013 Elsevier Interactive Patient Education [employer].

## 2015-11-19 LAB — COMPREHENSIVE METABOLIC PANEL
ALT: 27 U/L (ref 14–54)
ANION GAP: 8 (ref 5–15)
AST: 30 U/L (ref 15–41)
Albumin: 4.2 g/dL (ref 3.5–5.0)
Alkaline Phosphatase: 101 U/L (ref 38–126)
BUN: 17 mg/dL (ref 6–20)
CALCIUM: 8.9 mg/dL (ref 8.9–10.3)
CO2: 23 mmol/L (ref 22–32)
Chloride: 104 mmol/L (ref 101–111)
Creatinine, Ser: 1.07 mg/dL — ABNORMAL HIGH (ref 0.44–1.00)
GFR, EST AFRICAN AMERICAN: 60 mL/min — AB (ref >60)
GFR, EST NON AFRICAN AMERICAN: 51 mL/min — AB (ref >60)
Glucose, Bld: 124 mg/dL — ABNORMAL HIGH (ref 65–99)
POTASSIUM: 4.2 mmol/L (ref 3.5–5.1)
Sodium: 135 mmol/L (ref 135–145)
Total Bilirubin: 0.6 mg/dL (ref 0.3–1.2)
Total Protein: 7.1 g/dL (ref 6.5–8.1)

## 2015-11-19 LAB — TROPONIN I

## 2015-11-19 NOTE — ED Provider Notes (Signed)
-----------------------------------------   12:49 AM on 11/19/2015 -----------------------------------------   Blood pressure 139/62, pulse 98, temperature 100 F (37.8 C), temperature source Oral, resp. rate 20, height 5' (1.524 m), weight 115 lb (52.164 kg), SpO2 98 %.  Assuming care from Dr. Lenard Lance.  In short, Erica Little is a 69 y.o. female with a chief complaint of Cough and Shortness of Breath .  Refer to the original H&P for additional details.  The current plan of care is to follow up the patient's blood work.  The patient's blood work is unremarkable.  The patient will be discharged to follow up with her primary care physician   Rebecka Apley, MD 11/19/15 [phone]

## 2015-11-19 NOTE — ED Notes (Signed)
Son is at bedside. He had some questions. This RN informed pt's son of treatment plan thus far. Son verbalized understanding.

## 2015-11-19 NOTE — ED Notes (Signed)
Pt was asked if she wanted a wheelchair. Pt stated she wanted to walk to car. Pt with steady gait upon D/C.

## 2015-11-21 ENCOUNTER — Ambulatory Visit (INDEPENDENT_AMBULATORY_CARE_PROVIDER_SITE_OTHER): Payer: Medicare Other | Admitting: Family Medicine

## 2015-11-21 ENCOUNTER — Encounter: Payer: Self-pay | Admitting: Family Medicine

## 2015-11-21 VITALS — BP 126/76 | HR 79 | Temp 98.7°F | Ht 61.5 in | Wt 112.4 lb

## 2015-11-21 DIAGNOSIS — J209 Acute bronchitis, unspecified: Secondary | ICD-10-CM

## 2015-11-21 DIAGNOSIS — R739 Hyperglycemia, unspecified: Secondary | ICD-10-CM

## 2015-11-21 DIAGNOSIS — R7309 Other abnormal glucose: Secondary | ICD-10-CM

## 2015-11-21 LAB — HEMOGLOBIN A1C: Hgb A1c MFr Bld: 5.7 % (ref 4.6–6.5)

## 2015-11-21 NOTE — Progress Notes (Signed)
Pre visit review using our clinic review tool, if applicable. No additional management support is needed unless otherwise documented below in the visit note. 

## 2015-11-21 NOTE — Assessment & Plan Note (Signed)
Glucose elevated to 124 in the ED. She is asymptomatic. We will check an A1c. We'll call with results.

## 2015-11-21 NOTE — Patient Instructions (Signed)
Nice to meet you. Please complete your course of antibiotics and prednisone. If he develops shortness of breath, chest pain, fevers, productive cough, or any new or change in symptoms please seek medical attention.

## 2015-11-21 NOTE — Progress Notes (Signed)
Patient ID: [name], female   DOB: 07/14/1946, 69 y.o.   MRN: [id number]  [name], MD Phone: [phone]  Erica Little is a 69 y.o. female who presents today for ED follow-up.  Bronchitis: Patient was seen in the ED 2 days ago for increasing cough and mild shortness of breath. Her ED course is reviewed. She was started on an inhaler, Z-Pak, and prednisone. She notes at this time she feels well. She has no shortness of breath. She has no chest pain. She's not had any fevers. She still has mild cough. On review of her lab work it appears that her glucose was elevated to 124. She notes no polyuria or polydipsia, though does note that if she drinks something she urinates quickly after this. She has no family history of diabetes. She's never had elevated blood glucose per her knowledge before.  PMH: Former smoker.   ROS see history of present illness  Objective  Physical Exam Filed Vitals:   11/21/15 1035  BP: 126/76  Pulse: 79  Temp: 98.7 F (37.1 C)   Physical Exam  Constitutional: She is well-developed, well-nourished, and in no distress.  HENT:  Head: Normocephalic and atraumatic.  Right Ear: External ear normal.  Left Ear: External ear normal.  Mouth/Throat: Oropharynx is clear and moist. No oropharyngeal exudate.  Eyes: Conjunctivae are normal. Pupils are equal, round, and reactive to light.  Neck: Neck supple.  Cardiovascular: Normal rate, regular rhythm and normal heart sounds.  Exam reveals no gallop and no friction rub.   No murmur heard. Pulmonary/Chest: Effort normal and breath sounds normal. No respiratory distress. She has no wheezes. She has no rales.  Lymphadenopathy:    She has no cervical adenopathy.  Skin: Skin is warm and dry. She is not diaphoretic.     Assessment/Plan: Please see individual problem list.  Acute bronchitis Patient's symptoms consistent with bronchitis. She has improved at this time. This could be an acute exacerbation of a  chronic bronchitis given her 6 months of cough prior to this episode. She'll finish the current treatment course. She can follow-up with her PCP as scheduled in January to discuss any further workup given her smoking history. In return precautions.  Elevated random blood glucose level Glucose elevated to 124 in the ED. She is asymptomatic. We will check an A1c. We'll call with results.    Orders Placed This Encounter  Procedures  . HgB A1c   [name]

## 2015-11-21 NOTE — Assessment & Plan Note (Signed)
Patient's symptoms consistent with bronchitis. She has improved at this time. This could be an acute exacerbation of a chronic bronchitis given her 6 months of cough prior to this episode. She'll finish the current treatment course. She can follow-up with her PCP as scheduled in January to discuss any further workup given her smoking history. In return precautions.

## 2015-11-27 ENCOUNTER — Encounter: Payer: Self-pay | Admitting: Family Medicine

## 2015-12-22 ENCOUNTER — Other Ambulatory Visit: Payer: Self-pay | Admitting: Internal Medicine

## 2016-01-01 ENCOUNTER — Other Ambulatory Visit: Payer: Self-pay | Admitting: Internal Medicine

## 2016-01-09 ENCOUNTER — Ambulatory Visit: Payer: Medicare Other | Admitting: Internal Medicine

## 2016-01-16 DIAGNOSIS — M0579 Rheumatoid arthritis with rheumatoid factor of multiple sites without organ or systems involvement: Secondary | ICD-10-CM | POA: Diagnosis not present

## 2016-01-28 ENCOUNTER — Other Ambulatory Visit: Payer: Self-pay | Admitting: Internal Medicine

## 2016-02-06 ENCOUNTER — Ambulatory Visit (INDEPENDENT_AMBULATORY_CARE_PROVIDER_SITE_OTHER): Payer: Medicare HMO | Admitting: Internal Medicine

## 2016-02-06 ENCOUNTER — Encounter: Payer: Self-pay | Admitting: Internal Medicine

## 2016-02-06 VITALS — BP 120/82 | HR 72 | Temp 98.1°F | Resp 18 | Ht 61.5 in | Wt 115.4 lb

## 2016-02-06 DIAGNOSIS — M069 Rheumatoid arthritis, unspecified: Secondary | ICD-10-CM

## 2016-02-06 DIAGNOSIS — K2970 Gastritis, unspecified, without bleeding: Secondary | ICD-10-CM

## 2016-02-06 DIAGNOSIS — M810 Age-related osteoporosis without current pathological fracture: Secondary | ICD-10-CM

## 2016-02-06 DIAGNOSIS — R05 Cough: Secondary | ICD-10-CM

## 2016-02-06 DIAGNOSIS — I10 Essential (primary) hypertension: Secondary | ICD-10-CM

## 2016-02-06 DIAGNOSIS — R059 Cough, unspecified: Secondary | ICD-10-CM

## 2016-02-06 DIAGNOSIS — R739 Hyperglycemia, unspecified: Secondary | ICD-10-CM

## 2016-02-06 DIAGNOSIS — E7800 Pure hypercholesterolemia, unspecified: Secondary | ICD-10-CM

## 2016-02-06 DIAGNOSIS — R0989 Other specified symptoms and signs involving the circulatory and respiratory systems: Secondary | ICD-10-CM

## 2016-02-06 NOTE — Patient Instructions (Signed)
Zantac (ranitidine) 150mg - take 30 minutes before your evening meal.   

## 2016-02-06 NOTE — Progress Notes (Signed)
Pre-visit discussion using our clinic review tool. No additional management support is needed unless otherwise documented below in the visit note.  

## 2016-02-06 NOTE — Progress Notes (Signed)
Patient ID: Erica Little, female   DOB: 02/05/1946, 70 y.o.   MRN: [id number]   Subjective:    Patient ID: Erica Little, female    DOB: July 15, 1946, 70 y.o.   MRN: [id number]  HPI  Patient with past history of RA, hypertension, GERD and hypercholesterolemia.  She comes in today for a scheduled follow up.  She is doing better.  Seeing Dr Gavin Potters for her RA.  Stable.  Is dancing.  Feels better.  Eating well.  No chest pain or tightness.  No sob.  Previously had some abdominal discomfort.  Discussed further w/up.  Wants to follow.  No vomiting.  Bowels stable.  Discussed zantac.  Discussed her bone density.  Discussed treatment.  Interested in Pensions consultant.     Past Medical History  Diagnosis Date  . Rheumatoid arthritis(714.0)     transaminitis on MTX, remicade rxn, s/p sulfasalazine, orencia  . Ulcer disease   . Hypertension   . Hyperlipidemia   . MRSA (methicillin resistant Staphylococcus aureus)     skin infections  . GERD (gastroesophageal reflux disease)    Past Surgical History  Procedure Laterality Date  . Cholecystectomy  2010  . Mcp replacements  2001    left  . Tubal ligation     Family History  Problem Relation Age of Onset  . Heart disease Mother   . Diabetes Mother   . Hypercholesterolemia Mother   . Heart disease Father   . Heart disease Brother     MI age 45  . Hypercholesterolemia Sister   . Colon polyps Sister   . Breast cancer Neg Hx   . Arthritis/Rheumatoid Paternal Aunt   . Arthritis/Rheumatoid Paternal Uncle    Social History   Social History  . Marital Status: Widowed    Spouse Name: N/A  . Number of Children: 1  . Years of Education: N/A   Social History Main Topics  . Smoking status: Former Smoker    Quit date: 12/30/1988  . Smokeless tobacco: Never Used  . Alcohol Use: No  . Drug Use: No  . Sexual Activity: Not Asked   Other Topics Concern  . None   Social History Narrative    Outpatient Encounter Prescriptions as of 02/06/2016    Medication Sig  . abatacept (ORENCIA) 250 MG injection Once every month  . albuterol (PROVENTIL HFA;VENTOLIN HFA) 108 (90 BASE) MCG/ACT inhaler Inhale 2 puffs into the lungs every 6 (six) hours as needed for wheezing or shortness of breath.  Marland Kitchen amLODipine (NORVASC) 2.5 MG tablet TAKE 1 TABLET (2.5 MG TOTAL) BY MOUTH DAILY.  . calcium gluconate 650 MG tablet Take 650 mg by mouth daily.  . cholecalciferol (VITAMIN D) 1000 UNITS tablet Take 1,000 Units by mouth daily.  . CRESTOR 5 MG tablet TAKE 1 TABLET (5 MG TOTAL) BY MOUTH DAILY.  Marland Kitchen etodolac (LODINE) 400 MG tablet Take 400 mg by mouth daily.   . fluticasone (FLONASE) 50 MCG/ACT nasal spray Place 2 sprays into the nose daily as needed.  . fluticasone (FLOVENT HFA) 110 MCG/ACT inhaler Inhale 2 puffs into the lungs 2 (two) times daily as needed.  . gabapentin (NEURONTIN) 100 MG capsule Take 1 capsule (100 mg total) by mouth 3 (three) times daily.  Marland Kitchen losartan (COZAAR) 100 MG tablet TAKE 1 TABLET (100 MG TOTAL) BY MOUTH DAILY.  . magnesium oxide (MAG-OX) 400 MG tablet Take 400 mg by mouth daily.  . methotrexate (RHEUMATREX) 2.5 MG tablet Take 2.5 mg by mouth once  a week.   . Multiple Vitamin (MULTIVITAMIN) tablet Take 1 tablet by mouth daily.  Marland Kitchen omeprazole (PRILOSEC) 20 MG capsule TAKE ONE CAPSULE BY MOUTH EVERY DAY  . predniSONE (DELTASONE) 20 MG tablet Take 2 tablets (40 mg total) by mouth daily.  . rosuvastatin (CRESTOR) 5 MG tablet Take one tablet every Monday, Wednesday, & Friday  . valACYclovir (VALTREX) 1000 MG tablet Take 1 tablet (1,000 mg total) by mouth 3 (three) times daily.   No facility-administered encounter medications on file as of 02/06/2016.    Review of Systems  Constitutional: Negative for appetite change and unexpected weight change.  HENT: Negative for congestion and sinus pressure.   Eyes: Negative for discharge and redness.  Respiratory: Negative for cough, chest tightness and shortness of breath.   Cardiovascular:  Negative for chest pain, palpitations and leg swelling.  Gastrointestinal: Negative for nausea, vomiting and diarrhea.       Some abdominal discomfort.   Genitourinary: Negative for dysuria and difficulty urinating.  Musculoskeletal: Negative for joint swelling.       Joints stable.    Skin: Negative for color change and rash.  Neurological: Negative for dizziness, light-headedness and headaches.  Psychiatric/Behavioral: Negative for dysphoric mood and agitation.       Objective:    Physical Exam  Constitutional: She appears well-developed and well-nourished. No distress.  HENT:  Nose: Nose normal.  Mouth/Throat: Oropharynx is clear and moist.  Eyes: Conjunctivae are normal. Right eye exhibits no discharge. Left eye exhibits no discharge.  Neck: Neck supple. No thyromegaly present.  Cardiovascular: Normal rate and regular rhythm.   Pulmonary/Chest: Breath sounds normal. No respiratory distress. She has no wheezes.  Abdominal: Soft. Bowel sounds are normal. There is no tenderness.  Musculoskeletal: She exhibits no edema or tenderness.  Lymphadenopathy:    She has no cervical adenopathy.  Skin: No rash noted. No erythema.  Psychiatric: She has a normal mood and affect. Her behavior is normal.    BP 120/82 mmHg  Pulse 72  Temp(Src) 98.1 F (36.7 C) (Oral)  Resp 18  Ht 5' 1.5" (1.562 m)  Wt 115 lb 6 oz (52.334 kg)  BMI 21.45 kg/m2  SpO2 95% Wt Readings from Last 3 Encounters:  02/06/16 115 lb 6 oz (52.334 kg)  11/21/15 112 lb 6.4 oz (50.984 kg)  11/18/15 115 lb (52.164 kg)     Lab Results  Component Value Date   WBC 8.2 11/18/2015   HGB 12.8 11/18/2015   HCT 39.9 11/18/2015   PLT 182 11/18/2015   GLUCOSE 124* 11/18/2015   CHOL 189 05/23/2015   TRIG 174* 05/23/2015   HDL 40 05/23/2015   LDLDIRECT 167.8 12/20/2013   LDLCALC 131* 09/21/2014   ALT 27 11/18/2015   AST 30 11/18/2015   NA 135 11/18/2015   K 4.2 11/18/2015   CL 104 11/18/2015   CREATININE 1.07*  11/18/2015   BUN 17 11/18/2015   CO2 23 11/18/2015   TSH 0.73 09/21/2014   INR 1.1* 12/06/2013   HGBA1C 5.7 11/21/2015    Dg Chest 2 View  11/18/2015  CLINICAL DATA:  Shortness of breath. Nonproductive cough for 6 months. EXAM: CHEST  2 VIEW COMPARISON:  None. FINDINGS: Normal heart size. Normal mediastinal contour. No pneumothorax. No pleural effusion. Mild reticulonodular opacities at both lung apices, most in keeping with pleural parenchymal scarring. Otherwise clear lungs with no pulmonary edema and no focal lung consolidation. Cholecystectomy clips in the right upper quadrant of the abdomen. IMPRESSION: No active  cardiopulmonary disease. Electronically Signed   By: Delbert Phenix M.D.   On: 11/18/2015 22:17       Assessment & Plan:   Problem List Items Addressed This Visit    Carotid bruit    Seeing Dr Gilda Crease.        Cough    Staes has persistent cough.  No congestion.  Will start zantac.  See above.  See if resolves.        Gastritis    Previous EGD revealed gastritis.  Will restart zantac.  Follow.  Discussed abdominal ultrasound if any persistent problems.         Hypercholesteremia    On crestor.  Low cholesterol diet and exercise.  Follow lipid panel and liver function tests.        Relevant Orders   Lipid panel   Hepatic function panel   Hypertension - Primary    Blood pressure under good control.  Continue same medication regimen.  Follow pressures.  Follow metabolic panel.        Relevant Orders   TSH   Basic metabolic panel   Osteoporosis    Discussed recent bone density and treatment options.  Interested in reclast.  Check labs including vitamin D and urinalysis.        Relevant Orders   VITAMIN D 25 Hydroxy (Vit-D Deficiency, Fractures)   Urinalysis, Routine w reflex microscopic (not at [facility])   Rheumatoid arthritis (HCC)    Followed by Dr Gavin Potters.  Stable.  Is s/p hand surgery.        Relevant Orders   CBC with Differential/Platelet    Other  Visit Diagnoses    Hyperglycemia        Relevant Orders    Hemoglobin A1c        [place], MD

## 2016-02-13 ENCOUNTER — Encounter: Payer: Self-pay | Admitting: Internal Medicine

## 2016-02-13 DIAGNOSIS — M0579 Rheumatoid arthritis with rheumatoid factor of multiple sites without organ or systems involvement: Secondary | ICD-10-CM | POA: Diagnosis not present

## 2016-02-13 NOTE — Assessment & Plan Note (Signed)
On crestor.  Low cholesterol diet and exercise.  Follow lipid panel and liver function tests.   

## 2016-02-13 NOTE — Assessment & Plan Note (Signed)
Discussed recent bone density and treatment options.  Interested in reclast.  Check labs including vitamin D and urinalysis.

## 2016-02-13 NOTE — Assessment & Plan Note (Signed)
Followed by Dr Gavin Potters.  Stable.  Is s/p hand surgery.

## 2016-02-13 NOTE — Assessment & Plan Note (Signed)
Staes has persistent cough.  No congestion.  Will start zantac.  See above.  See if resolves.

## 2016-02-13 NOTE — Assessment & Plan Note (Addendum)
Previous EGD revealed gastritis.  Will restart zantac.  Follow.  Discussed abdominal ultrasound if any persistent problems.

## 2016-02-13 NOTE — Assessment & Plan Note (Signed)
Seeing Dr Schnier.  

## 2016-02-13 NOTE — Assessment & Plan Note (Signed)
Blood pressure under good control.  Continue same medication regimen.  Follow pressures.  Follow metabolic panel.   

## 2016-02-16 ENCOUNTER — Encounter: Payer: Self-pay | Admitting: Family Medicine

## 2016-02-16 ENCOUNTER — Ambulatory Visit (INDEPENDENT_AMBULATORY_CARE_PROVIDER_SITE_OTHER)
Admission: RE | Admit: 2016-02-16 | Discharge: 2016-02-16 | Disposition: A | Discharge status: Home / Self Care | Payer: Medicare HMO | Source: Ambulatory Visit | Attending: Family Medicine | Admitting: Family Medicine

## 2016-02-16 ENCOUNTER — Ambulatory Visit (INDEPENDENT_AMBULATORY_CARE_PROVIDER_SITE_OTHER): Payer: Medicare HMO | Admitting: Family Medicine

## 2016-02-16 VITALS — BP 114/76 | HR 85 | Temp 99.1°F | Ht 61.5 in | Wt 112.6 lb

## 2016-02-16 DIAGNOSIS — R05 Cough: Secondary | ICD-10-CM | POA: Diagnosis not present

## 2016-02-16 DIAGNOSIS — R059 Cough, unspecified: Secondary | ICD-10-CM

## 2016-02-16 DIAGNOSIS — R52 Pain, unspecified: Secondary | ICD-10-CM

## 2016-02-16 LAB — POCT INFLUENZA A/B
INFLUENZA A, POC: NEGATIVE
INFLUENZA B, POC: NEGATIVE

## 2016-02-16 MED ORDER — AZITHROMYCIN 250 MG PO TABS: ORAL_TABLET | ORAL | Status: DC

## 2016-02-16 MED ORDER — PREDNISONE 20 MG PO TABS: 40.0000 mg | ORAL_TABLET | Freq: Every day | ORAL | Status: DC

## 2016-02-16 NOTE — Progress Notes (Signed)
Pre visit review using our clinic review tool, if applicable. No additional management support is needed unless otherwise documented below in the visit note. 

## 2016-02-16 NOTE — Patient Instructions (Signed)
Nice to meet you. Please go get the chest x-ray to evaluate for pneumonia. Once this comes back we will call you to discuss treatment of this issue. If you develop chest pain, shortness of breath, fevers, cough productive of blood, or any new or changing symptoms please seek medical attention.

## 2016-02-16 NOTE — Progress Notes (Signed)
Patient ID: Erica Little, female   DOB: 1946-07-10, 70 y.o.   MRN: [id number]  Erica Alar, MD Phone: [phone]  Erica Little is a 70 y.o. female who presents today for a visit.  She notes starting yesterday she developed nonproductive cough. She notes that she just does not feel well. Some body aches. No fever or rhinorrhea. Some postnasal drip. No ear fullness. No sinus congestion. Notes some chest congestion. No chest pain or shortness of breath. Notes that she has used her inhaler and this helps with the cough.  PMH: Former smoker.   ROS see history of present illness  Objective  Physical Exam Filed Vitals:   02/16/16 0756  BP: 114/76  Pulse: 85  Temp: 99.1 F (37.3 C)    BP Readings from Last 3 Encounters:  02/16/16 114/76  02/06/16 120/82  11/21/15 126/76   Wt Readings from Last 3 Encounters:  02/16/16 112 lb 9.6 oz (51.075 kg)  02/06/16 115 lb 6 oz (52.334 kg)  11/21/15 112 lb 6.4 oz (50.984 kg)    Physical Exam  Constitutional: No distress.  HENT:  Head: Normocephalic and atraumatic.  Right Ear: External ear normal.  Left Ear: External ear normal.  Mouth/Throat: Oropharynx is clear and moist. No oropharyngeal exudate.  Eyes: Conjunctivae are normal. Pupils are equal, round, and reactive to light.  Neck: Neck supple.  Cardiovascular: Normal rate, regular rhythm and normal heart sounds.  Exam reveals no gallop and no friction rub.   No murmur heard. Pulmonary/Chest: Effort normal. No respiratory distress. She has no wheezes.  Crackles left mid lung field, otherwise lung is clear  Lymphadenopathy:    She has no cervical adenopathy.  Neurological: She is alert. Gait normal.  Skin: Skin is warm and dry. She is not diaphoretic.     Assessment/Plan: Please see individual problem list.  Cough Patient's symptoms consistent with bronchitis. She did have crackles in the left lung, though chest x-ray was negative for pneumonia. Vital signs are stable.  Patient is well-appearing. Given that patient is on immunomodulation therapy for rheumatoid arthritis we will treat bronchitis with azithromycin and prednisone. She'll continue to monitor. She's given return precautions.     Orders Placed This Encounter  Procedures  . DG Chest 2 View    Standing Status: Future     Number of Occurrences: 1     Standing Expiration Date: 04/15/2017    Order Specific Question:  Reason for Exam (SYMPTOM  OR DIAGNOSIS REQUIRED)    Answer:  cough, left mid lung crackles    Order Specific Question:  Preferred imaging location?    Answer:  [facility]  . POCT Influenza A/B   negative rapid flu.  Meds ordered this encounter  Medications  . azithromycin (ZITHROMAX) 250 MG tablet    Sig: Please take 500 mg (2 tablets) by mouth today, then take 250 mg (one tablet) by mouth daily.    Dispense:  6 tablet    Refill:  0  . predniSONE (DELTASONE) 20 MG tablet    Sig: Take 2 tablets (40 mg total) by mouth daily with breakfast.    Dispense:  10 tablet    Refill:  0    Erica Little

## 2016-02-16 NOTE — Assessment & Plan Note (Signed)
Patient's symptoms consistent with bronchitis. She did have crackles in the left lung, though chest x-ray was negative for pneumonia. Vital signs are stable. Patient is well-appearing. Given that patient is on immunomodulation therapy for rheumatoid arthritis we will treat bronchitis with azithromycin and prednisone. She'll continue to monitor. She's given return precautions.

## 2016-03-14 ENCOUNTER — Telehealth: Payer: Self-pay | Admitting: Internal Medicine

## 2016-03-14 NOTE — Telephone Encounter (Signed)
Patient stated that like a shade came down over right eye lasting about a minute on Monday, but has not happened again. Patient stated that lately she has been having headaches, and vision starts to get blurry. Patient stated that she can tell when the headaches are coming on that she gets these squiggly lines in her vision. Patient thought maybe because she starting to get a cold the headaches started. Patient mentioned she saw neurology about one year ago and was advised to take magnesium for headaches.

## 2016-03-15 ENCOUNTER — Ambulatory Visit (INDEPENDENT_AMBULATORY_CARE_PROVIDER_SITE_OTHER): Payer: Medicare HMO | Admitting: Internal Medicine

## 2016-03-15 ENCOUNTER — Encounter: Payer: Self-pay | Admitting: Internal Medicine

## 2016-03-15 VITALS — BP 118/70 | HR 75 | Temp 98.1°F | Resp 12 | Ht 62.0 in | Wt 115.1 lb

## 2016-03-15 DIAGNOSIS — H539 Unspecified visual disturbance: Secondary | ICD-10-CM

## 2016-03-15 DIAGNOSIS — I10 Essential (primary) hypertension: Secondary | ICD-10-CM | POA: Diagnosis not present

## 2016-03-15 DIAGNOSIS — E7800 Pure hypercholesterolemia, unspecified: Secondary | ICD-10-CM

## 2016-03-15 DIAGNOSIS — M069 Rheumatoid arthritis, unspecified: Secondary | ICD-10-CM | POA: Diagnosis not present

## 2016-03-15 DIAGNOSIS — J209 Acute bronchitis, unspecified: Secondary | ICD-10-CM

## 2016-03-15 MED ORDER — LOVASTATIN 10 MG PO TABS: 10.0000 mg | ORAL_TABLET | Freq: Every day | ORAL | Status: DC

## 2016-03-15 NOTE — Telephone Encounter (Signed)
I asked Olegario Messier to call and check on pt.   She was placed on my 03/15/16 schedule for an appt.  Pt reported that on Monday 03/11/16, she experienced what appeared to be a shade coming down over her right eye.  The episode lasted for approximately one minute.  No other neuro changes and no reoccurrences since the one episode.  I spoke to pt.  Discussed above.  Given 5 days ago, hold on ER evaluation at this time.  Keep appt.  Discussed further w/up, including MRI, carotid ultrasound and neuro evaluation.  Start aspirin (ecasa 81mg ).  Discussed with neurology.

## 2016-03-15 NOTE — Progress Notes (Signed)
Pre visit review using our clinic review tool, if applicable. No additional management support is needed unless otherwise documented below in the visit note. 

## 2016-03-15 NOTE — Progress Notes (Signed)
Patient ID: Erica Little, female   DOB: 1946-08-08, 70 y.o.   MRN: [id number]   Subjective:    Patient ID: Erica Little, female    DOB: [birth year], 70 y.o.   MRN: [id number]  HPI  Patient here as a work in with concerns regarding previously noticing a shade being pulled down over her eye.  She reports that five days ago, she noticed gray "shade" coming over there eye.  1/2 of visual field.  Lasted for approximately one minute.  No headache.  No dizziness.  No chest pain or tightness.  No sob.  No other neuro changes.  Is eating.  Previously had bronchitis.  Cough and congestion better.  Breathing better.  She has not had any further episodes since the occurrence five days ago.  Does not take aspirin.  Discussed starting aspirin.     Past Medical History  Diagnosis Date  . Rheumatoid arthritis(714.0)     transaminitis on MTX, remicade rxn, s/p sulfasalazine, orencia  . Ulcer disease   . Hypertension   . Hyperlipidemia   . MRSA (methicillin resistant Staphylococcus aureus)     skin infections  . GERD (gastroesophageal reflux disease)    Past Surgical History  Procedure Laterality Date  . Cholecystectomy  2010  . Mcp replacements  2001    left  . Tubal ligation     Family History  Problem Relation Age of Onset  . Heart disease Mother   . Diabetes Mother   . Hypercholesterolemia Mother   . Heart disease Father   . Heart disease Brother     MI age 24  . Hypercholesterolemia Sister   . Colon polyps Sister   . Breast cancer Neg Hx   . Arthritis/Rheumatoid Paternal Aunt   . Arthritis/Rheumatoid Paternal Uncle    Social History   Social History  . Marital Status: Widowed    Spouse Name: N/A  . Number of Children: 1  . Years of Education: N/A   Social History Main Topics  . Smoking status: Former Smoker    Quit date: 12/30/1988  . Smokeless tobacco: Never Used  . Alcohol Use: No  . Drug Use: No  . Sexual Activity: Not Asked   Other Topics Concern  . None   Social  History Narrative     Review of Systems  Constitutional: Negative for fever.       Eating better.    HENT: Negative for congestion and sinus pressure.   Eyes: Positive for visual disturbance. Negative for pain.  Respiratory: Negative for chest tightness and shortness of breath.        Cough and congestion better.    Cardiovascular: Negative for chest pain, palpitations and leg swelling.  Gastrointestinal: Negative for nausea, vomiting, abdominal pain and diarrhea.  Genitourinary: Negative for dysuria and difficulty urinating.  Musculoskeletal: Negative for back pain and joint swelling.  Skin: Negative for color change and rash.  Neurological: Negative for dizziness, light-headedness and headaches.  Psychiatric/Behavioral: Negative for dysphoric mood and agitation.       Objective:     Blood pressure rechecked by me:  134/78  Physical Exam  Constitutional: She appears well-developed and well-nourished. No distress.  HENT:  Nose: Nose normal.  Mouth/Throat: Oropharynx is clear and moist.  Eyes: Conjunctivae are normal. Right eye exhibits no discharge. Left eye exhibits no discharge.  Neck: Neck supple.  Cardiovascular: Normal rate and regular rhythm.   Pulmonary/Chest: Breath sounds normal. No respiratory distress. She has no wheezes.  Abdominal: Soft. Bowel sounds are normal. There is no tenderness.  Musculoskeletal: She exhibits no edema or tenderness.  Lymphadenopathy:    She has no cervical adenopathy.    BP 118/70 mmHg  Pulse 75  Temp(Src) 98.1 F (36.7 C) (Oral)  Resp 12  Ht 5\' 2"  (1.575 m)  Wt 115 lb 1.9 oz (52.218 kg)  BMI 21.05 kg/m2  SpO2 95% Wt Readings from Last 3 Encounters:  03/15/16 115 lb 1.9 oz (52.218 kg)  02/16/16 112 lb 9.6 oz (51.075 kg)  02/06/16 115 lb 6 oz (52.334 kg)     Lab Results  Component Value Date   WBC 8.2 11/18/2015   HGB 12.8 11/18/2015   HCT 39.9 11/18/2015   PLT 182 11/18/2015   GLUCOSE 124* 11/18/2015   CHOL 189  05/23/2015   TRIG 174* 05/23/2015   HDL 40 05/23/2015   LDLDIRECT 167.8 12/20/2013   LDLCALC 131* 09/21/2014   ALT 27 11/18/2015   AST 30 11/18/2015   NA 135 11/18/2015   K 4.2 11/18/2015   CL 104 11/18/2015   CREATININE 1.07* 11/18/2015   BUN 17 11/18/2015   CO2 23 11/18/2015   TSH 0.73 09/21/2014   INR 1.1* 12/06/2013   HGBA1C 5.7 11/21/2015    Dg Chest 2 View  02/16/2016  CLINICAL DATA:  70 year old presenting with acute onset of nonproductive cough yesterday, associated with body aches. Crackles in the left mid lung on clinical examination today. EXAM: CHEST  2 VIEW COMPARISON:  11/18/2015, 04/25/2014. FINDINGS: Cardiac silhouette normal in size, unchanged. Thoracic aorta mildly atherosclerotic, unchanged. Hilar and mediastinal contours otherwise unremarkable. Mildly prominent bronchovascular markings diffusely and mild central peribronchial thickening, slightly greater than on prior examinations. Lungs otherwise clear. No localized airspace consolidation. No pleural effusions. No pneumothorax. Normal pulmonary vascularity. Visualized bony thorax intact. IMPRESSION: Mild changes of acute bronchitis and/or asthma without focal airspace pneumonia. Electronically Signed   By: 04/27/2014 M.D.   On: 02/16/2016 09:46       Assessment & Plan:   Problem List Items Addressed This Visit    Acute bronchitis    Improved.  Symptoms improved.  Breathing improved.  Follow.        Hypercholesteremia    Off cholesterol medication due to intolerance.  Will try lovastatin.  Follow.        Relevant Medications   lovastatin (MEVACOR) 10 MG tablet   Hypertension    Blood pressure under good control.  Continue same medication regimen.  Follow pressures.  Follow metabolic panel.        Relevant Medications   lovastatin (MEVACOR) 10 MG tablet   Rheumatoid arthritis (HCC)    Followed by Dr 02/18/2016.  On orencia.  Follow.  Will hold lodine.  Does not take often.        Visual disturbance  - Primary    Symptoms c/w amarosis fugax.  Discussed at length with her and her friend today.  No further symptoms.  Start ECASA 81mg  q day.  Schedule for MRI brain.  Also schedule carotid ultrasound.  If any reoccurring symptoms, she is to be evaluated immediately.        Relevant Orders   MR Brain W Wo Contrast   Ambulatory referral to Vascular Surgery       Gavin Potters, MD

## 2016-03-15 NOTE — Telephone Encounter (Signed)
Pt also notified she was to be evaluated in ER if any reoccurrence of symptoms.

## 2016-03-17 ENCOUNTER — Encounter: Payer: Self-pay | Admitting: Internal Medicine

## 2016-03-17 DIAGNOSIS — H539 Unspecified visual disturbance: Secondary | ICD-10-CM | POA: Insufficient documentation

## 2016-03-17 NOTE — Assessment & Plan Note (Signed)
Improved.  Symptoms improved.  Breathing improved.  Follow.

## 2016-03-17 NOTE — Assessment & Plan Note (Signed)
Blood pressure under good control.  Continue same medication regimen.  Follow pressures.  Follow metabolic panel.   

## 2016-03-17 NOTE — Assessment & Plan Note (Signed)
Symptoms c/w amarosis fugax.  Discussed at length with her and her friend today.  No further symptoms.  Start ECASA 81mg  q day.  Schedule for MRI brain.  Also schedule carotid ultrasound.  If any reoccurring symptoms, she is to be evaluated immediately.

## 2016-03-17 NOTE — Assessment & Plan Note (Signed)
Followed by Dr Gavin Potters.  On orencia.  Follow.  Will hold lodine.  Does not take often.

## 2016-03-17 NOTE — Assessment & Plan Note (Signed)
Off cholesterol medication due to intolerance.  Will try lovastatin.  Follow.

## 2016-03-18 ENCOUNTER — Encounter: Payer: Self-pay | Admitting: Internal Medicine

## 2016-03-19 DIAGNOSIS — G453 Amaurosis fugax: Secondary | ICD-10-CM | POA: Diagnosis not present

## 2016-03-19 DIAGNOSIS — I739 Peripheral vascular disease, unspecified: Secondary | ICD-10-CM | POA: Diagnosis not present

## 2016-03-19 DIAGNOSIS — I6529 Occlusion and stenosis of unspecified carotid artery: Secondary | ICD-10-CM | POA: Diagnosis not present

## 2016-03-19 DIAGNOSIS — M0579 Rheumatoid arthritis with rheumatoid factor of multiple sites without organ or systems involvement: Secondary | ICD-10-CM | POA: Diagnosis not present

## 2016-03-19 DIAGNOSIS — I10 Essential (primary) hypertension: Secondary | ICD-10-CM | POA: Diagnosis not present

## 2016-03-19 DIAGNOSIS — H5310 Unspecified subjective visual disturbances: Secondary | ICD-10-CM | POA: Diagnosis not present

## 2016-03-19 DIAGNOSIS — E785 Hyperlipidemia, unspecified: Secondary | ICD-10-CM | POA: Diagnosis not present

## 2016-03-19 DIAGNOSIS — R260 Ataxic gait: Secondary | ICD-10-CM | POA: Diagnosis not present

## 2016-03-20 DIAGNOSIS — G453 Amaurosis fugax: Secondary | ICD-10-CM | POA: Diagnosis not present

## 2016-03-21 DIAGNOSIS — M0579 Rheumatoid arthritis with rheumatoid factor of multiple sites without organ or systems involvement: Secondary | ICD-10-CM | POA: Diagnosis not present

## 2016-03-21 DIAGNOSIS — Z79899 Other long term (current) drug therapy: Secondary | ICD-10-CM | POA: Diagnosis not present

## 2016-03-27 ENCOUNTER — Other Ambulatory Visit (INDEPENDENT_AMBULATORY_CARE_PROVIDER_SITE_OTHER): Payer: Medicare HMO

## 2016-03-27 DIAGNOSIS — E7800 Pure hypercholesterolemia, unspecified: Secondary | ICD-10-CM | POA: Diagnosis not present

## 2016-03-27 DIAGNOSIS — I10 Essential (primary) hypertension: Secondary | ICD-10-CM | POA: Diagnosis not present

## 2016-03-27 DIAGNOSIS — R739 Hyperglycemia, unspecified: Secondary | ICD-10-CM | POA: Diagnosis not present

## 2016-03-27 DIAGNOSIS — M810 Age-related osteoporosis without current pathological fracture: Secondary | ICD-10-CM | POA: Diagnosis not present

## 2016-03-27 DIAGNOSIS — M069 Rheumatoid arthritis, unspecified: Secondary | ICD-10-CM | POA: Diagnosis not present

## 2016-03-27 LAB — URINALYSIS, ROUTINE W REFLEX MICROSCOPIC
Bilirubin Urine: NEGATIVE
Hgb urine dipstick: NEGATIVE
KETONES UR: NEGATIVE
Nitrite: NEGATIVE
TOTAL PROTEIN, URINE-UPE24: NEGATIVE
URINE GLUCOSE: NEGATIVE
UROBILINOGEN UA: 0.2 (ref 0.0–1.0)
pH: 7.5 (ref 5.0–8.0)

## 2016-03-27 LAB — CBC WITH DIFFERENTIAL/PLATELET
Basophils Absolute: 0 10*3/uL (ref 0.0–0.1)
Basophils Relative: 0.5 % (ref 0.0–3.0)
EOS PCT: 3.6 % (ref 0.0–5.0)
Eosinophils Absolute: 0.2 10*3/uL (ref 0.0–0.7)
HCT: 37 % (ref 36.0–46.0)
HEMOGLOBIN: 12.2 g/dL (ref 12.0–15.0)
Lymphocytes Relative: 31.7 % (ref 12.0–46.0)
Lymphs Abs: 1.9 10*3/uL (ref 0.7–4.0)
MCHC: 33 g/dL (ref 30.0–36.0)
MCV: 86.9 fl (ref 78.0–100.0)
MONOS PCT: 10.1 % (ref 3.0–12.0)
Monocytes Absolute: 0.6 10*3/uL (ref 0.1–1.0)
Neutro Abs: 3.3 10*3/uL (ref 1.4–7.7)
Neutrophils Relative %: 54.1 % (ref 43.0–77.0)
Platelets: 238 10*3/uL (ref 150.0–400.0)
RBC: 4.25 Mil/uL (ref 3.87–5.11)
RDW: 16.3 % — ABNORMAL HIGH (ref 11.5–15.5)
WBC: 6.2 10*3/uL (ref 4.0–10.5)

## 2016-03-27 LAB — BASIC METABOLIC PANEL
BUN: 11 mg/dL (ref 6–23)
CHLORIDE: 101 meq/L (ref 96–112)
CO2: 23 mEq/L (ref 19–32)
CREATININE: 0.95 mg/dL (ref 0.40–1.20)
Calcium: 9.4 mg/dL (ref 8.4–10.5)
GFR: 61.59 mL/min (ref >60.00)
GLUCOSE: 88 mg/dL (ref 70–99)
POTASSIUM: 4.7 meq/L (ref 3.5–5.1)
Sodium: 134 mEq/L — ABNORMAL LOW (ref 135–145)

## 2016-03-27 LAB — LIPID PANEL
CHOL/HDL RATIO: 4
Cholesterol: 190 mg/dL (ref 0–200)
HDL: 45 mg/dL (ref >39.00)
LDL Cholesterol: 122 mg/dL — ABNORMAL HIGH (ref 0–99)
NONHDL: 145.38
TRIGLYCERIDES: 117 mg/dL (ref 0.0–149.0)
VLDL: 23.4 mg/dL (ref 0.0–40.0)

## 2016-03-27 LAB — HEPATIC FUNCTION PANEL
ALBUMIN: 4.2 g/dL (ref 3.5–5.2)
ALT: 9 U/L (ref 0–35)
AST: 15 U/L (ref 0–37)
Alkaline Phosphatase: 100 U/L (ref 39–117)
Bilirubin, Direct: 0.2 mg/dL (ref 0.0–0.3)
Total Bilirubin: 0.9 mg/dL (ref 0.2–1.2)
Total Protein: 6.4 g/dL (ref 6.0–8.3)

## 2016-03-27 LAB — TSH: TSH: 2.87 u[IU]/mL (ref 0.35–4.50)

## 2016-03-27 LAB — HEMOGLOBIN A1C: Hgb A1c MFr Bld: 5.8 % (ref 4.6–6.5)

## 2016-03-27 LAB — VITAMIN D 25 HYDROXY (VIT D DEFICIENCY, FRACTURES): VITD: 40.53 ng/mL (ref 30.00–100.00)

## 2016-03-28 ENCOUNTER — Other Ambulatory Visit: Payer: Self-pay | Admitting: Internal Medicine

## 2016-03-28 DIAGNOSIS — E871 Hypo-osmolality and hyponatremia: Secondary | ICD-10-CM

## 2016-03-28 NOTE — Progress Notes (Signed)
Order placed for f/u sodium.  ?

## 2016-04-03 ENCOUNTER — Encounter: Payer: Self-pay | Admitting: Internal Medicine

## 2016-04-03 ENCOUNTER — Ambulatory Visit (INDEPENDENT_AMBULATORY_CARE_PROVIDER_SITE_OTHER): Payer: Medicare HMO | Admitting: Internal Medicine

## 2016-04-03 VITALS — BP 118/80 | HR 62 | Temp 98.0°F | Resp 18 | Ht 62.0 in | Wt 116.0 lb

## 2016-04-03 DIAGNOSIS — E7800 Pure hypercholesterolemia, unspecified: Secondary | ICD-10-CM

## 2016-04-03 DIAGNOSIS — M069 Rheumatoid arthritis, unspecified: Secondary | ICD-10-CM

## 2016-04-03 DIAGNOSIS — G453 Amaurosis fugax: Secondary | ICD-10-CM

## 2016-04-03 DIAGNOSIS — I10 Essential (primary) hypertension: Secondary | ICD-10-CM | POA: Diagnosis not present

## 2016-04-03 DIAGNOSIS — E871 Hypo-osmolality and hyponatremia: Secondary | ICD-10-CM | POA: Diagnosis not present

## 2016-04-03 NOTE — Progress Notes (Signed)
Pre-visit discussion using our clinic review tool. No additional management support is needed unless otherwise documented below in the visit note.  

## 2016-04-03 NOTE — Progress Notes (Signed)
Patient ID: Erica Little, female   DOB: 04/06/1946, 70 y.o.   MRN: [id number]   Subjective:    Patient ID: Erica Little, female    DOB: 1946-03-18, 70 y.o.   MRN: [id number]  HPI  Patient here for a scheduled follow up.  She was recently evaluated for amaurosis fugax.  See last note.  She saw opthalmology.  See their note.  Taking daily aspirin.  Had carotid ultrasound.  Need results.  Has MRI scheduled for next week.  We also discussed echo.  She denies any recurrent symptoms.  No headache.  No further vision changes.  No chest pain or tightness.  No sob.  No acid reflux.  No abdominal pain or cramping.  Bowels stable.    Past Medical History  Diagnosis Date  . Rheumatoid arthritis(714.0)     transaminitis on MTX, remicade rxn, s/p sulfasalazine, orencia  . Ulcer disease   . Hypertension   . Hyperlipidemia   . MRSA (methicillin resistant Staphylococcus aureus)     skin infections  . GERD (gastroesophageal reflux disease)    Past Surgical History  Procedure Laterality Date  . Cholecystectomy  2010  . Mcp replacements  2001    left  . Tubal ligation     Family History  Problem Relation Age of Onset  . Heart disease Mother   . Diabetes Mother   . Hypercholesterolemia Mother   . Heart disease Father   . Heart disease Brother     MI age 94  . Hypercholesterolemia Sister   . Colon polyps Sister   . Breast cancer Neg Hx   . Arthritis/Rheumatoid Paternal Aunt   . Arthritis/Rheumatoid Paternal Uncle    Social History   Social History  . Marital Status: Widowed    Spouse Name: N/A  . Number of Children: 1  . Years of Education: N/A   Social History Main Topics  . Smoking status: Former Smoker    Quit date: 12/30/1988  . Smokeless tobacco: Never Used  . Alcohol Use: No  . Drug Use: No  . Sexual Activity: Not Asked   Other Topics Concern  . None   Social History Narrative    Outpatient Encounter Prescriptions as of 04/03/2016  Medication Sig  . abatacept  (ORENCIA) 250 MG injection Once every month  . albuterol (PROVENTIL HFA;VENTOLIN HFA) 108 (90 BASE) MCG/ACT inhaler Inhale 2 puffs into the lungs every 6 (six) hours as needed for wheezing or shortness of breath.  Marland Kitchen amLODipine (NORVASC) 2.5 MG tablet TAKE 1 TABLET (2.5 MG TOTAL) BY MOUTH DAILY.  . calcium gluconate 650 MG tablet Take 650 mg by mouth daily.  . cholecalciferol (VITAMIN D) 1000 UNITS tablet Take 1,000 Units by mouth daily.  . CRESTOR 5 MG tablet TAKE 1 TABLET (5 MG TOTAL) BY MOUTH DAILY.  Marland Kitchen etodolac (LODINE) 400 MG tablet Take 400 mg by mouth daily.   . fluticasone (FLONASE) 50 MCG/ACT nasal spray Place 2 sprays into the nose daily as needed.  . fluticasone (FLOVENT HFA) 110 MCG/ACT inhaler Inhale 2 puffs into the lungs 2 (two) times daily as needed.  . gabapentin (NEURONTIN) 100 MG capsule Take 1 capsule (100 mg total) by mouth 3 (three) times daily.  Marland Kitchen losartan (COZAAR) 100 MG tablet TAKE 1 TABLET (100 MG TOTAL) BY MOUTH DAILY.  Marland Kitchen lovastatin (MEVACOR) 10 MG tablet Take 1 tablet (10 mg total) by mouth at bedtime.  . magnesium oxide (MAG-OX) 400 MG tablet Take 400 mg by mouth  daily.  . methotrexate (RHEUMATREX) 2.5 MG tablet Take 2.5 mg by mouth once a week.   . Multiple Vitamin (MULTIVITAMIN) tablet Take 1 tablet by mouth daily.  Marland Kitchen omeprazole (PRILOSEC) 20 MG capsule TAKE ONE CAPSULE BY MOUTH EVERY DAY  . valACYclovir (VALTREX) 1000 MG tablet Take 1 tablet (1,000 mg total) by mouth 3 (three) times daily.  . [DISCONTINUED] predniSONE (DELTASONE) 20 MG tablet Take 2 tablets (40 mg total) by mouth daily with breakfast.  . [DISCONTINUED] azithromycin (ZITHROMAX) 250 MG tablet Please take 500 mg (2 tablets) by mouth today, then take 250 mg (one tablet) by mouth daily.   No facility-administered encounter medications on file as of 04/03/2016.    Review of Systems  Constitutional: Negative for appetite change and unexpected weight change.  HENT: Negative for congestion and sinus  pressure.   Respiratory: Negative for cough, chest tightness and shortness of breath.   Cardiovascular: Negative for chest pain, palpitations and leg swelling.  Gastrointestinal: Negative for nausea, vomiting, abdominal pain and diarrhea.  Genitourinary: Negative for dysuria and difficulty urinating.  Musculoskeletal: Negative for back pain and joint swelling.  Skin: Negative for color change and rash.  Neurological: Negative for dizziness, light-headedness and headaches.  Psychiatric/Behavioral: Negative for dysphoric mood and agitation.       Objective:    Physical Exam  Constitutional: She appears well-developed and well-nourished. No distress.  HENT:  Nose: Nose normal.  Mouth/Throat: Oropharynx is clear and moist.  Neck: Neck supple. No thyromegaly present.  Cardiovascular: Normal rate and regular rhythm.   Pulmonary/Chest: Breath sounds normal. No respiratory distress. She has no wheezes.  Abdominal: Soft. Bowel sounds are normal. There is no tenderness.  Musculoskeletal: She exhibits no edema or tenderness.  Lymphadenopathy:    She has no cervical adenopathy.  Skin: No rash noted. No erythema.  Psychiatric: She has a normal mood and affect. Her behavior is normal.  Nursing note reviewed.   BP 118/80 mmHg  Pulse 62  Temp(Src) 98 F (36.7 C) (Oral)  Resp 18  Ht 5\' 2"  (1.575 m)  Wt 116 lb (52.617 kg)  BMI 21.21 kg/m2  SpO2 97% Wt Readings from Last 3 Encounters:  04/03/16 116 lb (52.617 kg)  03/15/16 115 lb 1.9 oz (52.218 kg)  02/16/16 112 lb 9.6 oz (51.075 kg)     Lab Results  Component Value Date   WBC 6.2 03/27/2016   HGB 12.2 03/27/2016   HCT 37.0 03/27/2016   PLT 238.0 03/27/2016   GLUCOSE 88 03/27/2016   CHOL 190 03/27/2016   TRIG 117.0 03/27/2016   HDL 45.00 03/27/2016   LDLDIRECT 167.8 12/20/2013   LDLCALC 122* 03/27/2016   ALT 9 03/27/2016   AST 15 03/27/2016   NA 134* 03/27/2016   K 4.7 03/27/2016   CL 101 03/27/2016   CREATININE 0.95  03/27/2016   BUN 11 03/27/2016   CO2 23 03/27/2016   TSH 2.87 03/27/2016   INR 1.1* 12/06/2013   HGBA1C 5.8 03/27/2016    Dg Chest 2 View  02/16/2016  CLINICAL DATA:  70 year old presenting with acute onset of nonproductive cough yesterday, associated with body aches. Crackles in the left mid lung on clinical examination today. EXAM: CHEST  2 VIEW COMPARISON:  11/18/2015, 04/25/2014. FINDINGS: Cardiac silhouette normal in size, unchanged. Thoracic aorta mildly atherosclerotic, unchanged. Hilar and mediastinal contours otherwise unremarkable. Mildly prominent bronchovascular markings diffusely and mild central peribronchial thickening, slightly greater than on prior examinations. Lungs otherwise clear. No localized airspace consolidation. No pleural  effusions. No pneumothorax. Normal pulmonary vascularity. Visualized bony thorax intact. IMPRESSION: Mild changes of acute bronchitis and/or asthma without focal airspace pneumonia. Electronically Signed   By: Hulan Saas M.D.   On: 02/16/2016 09:46       Assessment & Plan:   Problem List Items Addressed This Visit    Amaurosis fugax - Primary    Amaurosis fugax.  See last note.  Had carotid ultrasound.  Obtain results.  Scheduled for MRI next week.  Schedule echo.  Taking aspirin daily.  No reoccurring symptoms.  Follow.       Relevant Orders   ECHOCARDIOGRAM COMPLETE   Hypercholesteremia    On lovastatin.  Follow lipid panel and liver function tests.        Hypertension    Blood pressure under good control.  Continue same medication regimen.  Follow pressures.  Follow metabolic panel.        Hyponatremia    Schedule for f/u sodium check.        Relevant Orders   Sodium   Rheumatoid arthritis (HCC)    Followed by Dr Gavin Potters.  On Orencia.  Off lodine.  Follow.           Dale Arpelar, MD

## 2016-04-08 ENCOUNTER — Ambulatory Visit
Admission: RE | Admit: 2016-04-08 | Discharge: 2016-04-08 | Disposition: A | Discharge status: Home / Self Care | Payer: Medicare HMO | Source: Ambulatory Visit | Attending: Internal Medicine | Admitting: Internal Medicine

## 2016-04-08 ENCOUNTER — Other Ambulatory Visit: Payer: Self-pay | Admitting: Internal Medicine

## 2016-04-08 ENCOUNTER — Encounter: Payer: Self-pay | Admitting: Internal Medicine

## 2016-04-08 DIAGNOSIS — J329 Chronic sinusitis, unspecified: Secondary | ICD-10-CM | POA: Diagnosis not present

## 2016-04-08 DIAGNOSIS — H539 Unspecified visual disturbance: Secondary | ICD-10-CM | POA: Insufficient documentation

## 2016-04-08 DIAGNOSIS — G453 Amaurosis fugax: Secondary | ICD-10-CM | POA: Insufficient documentation

## 2016-04-08 DIAGNOSIS — R9089 Other abnormal findings on diagnostic imaging of central nervous system: Secondary | ICD-10-CM

## 2016-04-08 DIAGNOSIS — R51 Headache: Secondary | ICD-10-CM | POA: Diagnosis not present

## 2016-04-08 DIAGNOSIS — J3489 Other specified disorders of nose and nasal sinuses: Secondary | ICD-10-CM | POA: Diagnosis not present

## 2016-04-08 DIAGNOSIS — R938 Abnormal findings on diagnostic imaging of other specified body structures: Secondary | ICD-10-CM | POA: Diagnosis not present

## 2016-04-08 MED ORDER — GADOBENATE DIMEGLUMINE 529 MG/ML IV SOLN
10.0000 mL | Freq: Once | INTRAVENOUS | Status: AC | PRN
  Administered 2016-04-08: 10 mL via INTRAVENOUS

## 2016-04-08 NOTE — Assessment & Plan Note (Signed)
Amaurosis fugax.  See last note.  Had carotid ultrasound.  Obtain results.  Scheduled for MRI next week.  Schedule echo.  Taking aspirin daily.  No reoccurring symptoms.  Follow.

## 2016-04-08 NOTE — Assessment & Plan Note (Signed)
Schedule for f/u sodium check.

## 2016-04-08 NOTE — Assessment & Plan Note (Signed)
Followed by Dr Gavin Potters.  On Orencia.  Off lodine.  Follow.

## 2016-04-08 NOTE — Assessment & Plan Note (Signed)
Blood pressure under good control.  Continue same medication regimen.  Follow pressures.  Follow metabolic panel.   

## 2016-04-08 NOTE — Assessment & Plan Note (Signed)
On lovastatin.  Follow lipid panel and liver function tests.   

## 2016-04-08 NOTE — Progress Notes (Signed)
Order placed for mra

## 2016-04-09 ENCOUNTER — Telehealth: Payer: Self-pay

## 2016-04-09 NOTE — Telephone Encounter (Signed)
Called patient to schedule new patient appt .  lmov to call office .

## 2016-04-10 ENCOUNTER — Telehealth: Payer: Self-pay | Admitting: *Deleted

## 2016-04-10 ENCOUNTER — Other Ambulatory Visit (INDEPENDENT_AMBULATORY_CARE_PROVIDER_SITE_OTHER): Payer: Medicare HMO

## 2016-04-10 ENCOUNTER — Encounter: Payer: Self-pay | Admitting: *Deleted

## 2016-04-10 DIAGNOSIS — E871 Hypo-osmolality and hyponatremia: Secondary | ICD-10-CM | POA: Diagnosis not present

## 2016-04-10 LAB — SODIUM: Sodium: 131 mEq/L — ABNORMAL LOW (ref 135–145)

## 2016-04-10 NOTE — Telephone Encounter (Signed)
Patient questioned if it was standard  to see the cardiologist after her echocardiogram  Pt Contact [phone]

## 2016-04-10 NOTE — Telephone Encounter (Signed)
Left message on machine for patient to call us back 

## 2016-04-11 ENCOUNTER — Other Ambulatory Visit: Payer: Self-pay | Admitting: Internal Medicine

## 2016-04-11 DIAGNOSIS — E871 Hypo-osmolality and hyponatremia: Secondary | ICD-10-CM

## 2016-04-11 NOTE — Progress Notes (Signed)
Order placed for f/u sodium.  ?

## 2016-04-11 NOTE — Telephone Encounter (Signed)
Reassured patient that it is standard care for patients who have had any heart issue to be seen by cardiologist. She was satisfied with this information.

## 2016-04-16 DIAGNOSIS — M0579 Rheumatoid arthritis with rheumatoid factor of multiple sites without organ or systems involvement: Secondary | ICD-10-CM | POA: Diagnosis not present

## 2016-04-17 ENCOUNTER — Other Ambulatory Visit: Payer: Self-pay

## 2016-04-17 DIAGNOSIS — Z1231 Encounter for screening mammogram for malignant neoplasm of breast: Secondary | ICD-10-CM

## 2016-04-18 ENCOUNTER — Other Ambulatory Visit (INDEPENDENT_AMBULATORY_CARE_PROVIDER_SITE_OTHER): Payer: Medicare HMO

## 2016-04-18 ENCOUNTER — Other Ambulatory Visit: Payer: Self-pay | Admitting: Internal Medicine

## 2016-04-18 DIAGNOSIS — E871 Hypo-osmolality and hyponatremia: Secondary | ICD-10-CM

## 2016-04-18 DIAGNOSIS — G453 Amaurosis fugax: Secondary | ICD-10-CM

## 2016-04-18 DIAGNOSIS — G459 Transient cerebral ischemic attack, unspecified: Secondary | ICD-10-CM

## 2016-04-18 LAB — SODIUM: SODIUM: 134 meq/L — AB (ref 135–145)

## 2016-04-19 ENCOUNTER — Ambulatory Visit
Admission: RE | Admit: 2016-04-19 | Discharge: 2016-04-19 | Disposition: A | Discharge status: Home / Self Care | Payer: Medicare HMO | Source: Ambulatory Visit | Attending: Internal Medicine | Admitting: Internal Medicine

## 2016-04-19 DIAGNOSIS — I671 Cerebral aneurysm, nonruptured: Secondary | ICD-10-CM | POA: Diagnosis not present

## 2016-04-19 DIAGNOSIS — G453 Amaurosis fugax: Secondary | ICD-10-CM

## 2016-04-19 DIAGNOSIS — R9089 Other abnormal findings on diagnostic imaging of central nervous system: Secondary | ICD-10-CM

## 2016-04-23 ENCOUNTER — Other Ambulatory Visit: Payer: Self-pay

## 2016-04-23 ENCOUNTER — Ambulatory Visit (INDEPENDENT_AMBULATORY_CARE_PROVIDER_SITE_OTHER): Payer: Medicare HMO

## 2016-04-23 ENCOUNTER — Other Ambulatory Visit: Payer: Self-pay | Admitting: Internal Medicine

## 2016-04-23 DIAGNOSIS — R9089 Other abnormal findings on diagnostic imaging of central nervous system: Secondary | ICD-10-CM

## 2016-04-23 DIAGNOSIS — G459 Transient cerebral ischemic attack, unspecified: Secondary | ICD-10-CM | POA: Diagnosis not present

## 2016-04-23 DIAGNOSIS — G453 Amaurosis fugax: Secondary | ICD-10-CM | POA: Diagnosis not present

## 2016-04-23 NOTE — Progress Notes (Signed)
Pt notified of MRA results.  Called Dr Santiago Glad office.  Recommended Dr Miguel Aschoff or Jayme Cloud).  Order for referral placed.

## 2016-05-01 ENCOUNTER — Ambulatory Visit: Payer: Medicare HMO | Admitting: Cardiology

## 2016-05-02 ENCOUNTER — Telehealth: Payer: Self-pay | Admitting: *Deleted

## 2016-05-02 ENCOUNTER — Ambulatory Visit: Payer: Medicare HMO

## 2016-05-02 DIAGNOSIS — G453 Amaurosis fugax: Secondary | ICD-10-CM | POA: Diagnosis not present

## 2016-05-02 DIAGNOSIS — I671 Cerebral aneurysm, nonruptured: Secondary | ICD-10-CM | POA: Diagnosis not present

## 2016-05-02 NOTE — Telephone Encounter (Signed)
FYI Patient stated that Erica Little vain will fax over her clotted artery test results. She stated that Dr. Arlyss Queen from [facility] has requested to see results as well.

## 2016-05-02 NOTE — Telephone Encounter (Signed)
Please call patient when fax is received -thanks

## 2016-05-03 NOTE — Telephone Encounter (Signed)
Requested results by fax. Tried to call office, went to mailbox.

## 2016-05-06 NOTE — Telephone Encounter (Signed)
Patient dropped results off, results are in Dr. Roby Lofts mailbox.

## 2016-05-07 NOTE — Telephone Encounter (Signed)
Placed in yellow folder.  

## 2016-05-08 NOTE — Telephone Encounter (Signed)
faxed

## 2016-05-08 NOTE — Telephone Encounter (Signed)
Will review when get back to office.  Please send copy to Dr Arlyss Queen (Neurosurgery at [facility]) if she has not already.  Thanks.

## 2016-05-13 ENCOUNTER — Ambulatory Visit
Admission: RE | Admit: 2016-05-13 | Discharge: 2016-05-13 | Disposition: A | Discharge status: Home / Self Care | Payer: Medicare HMO | Source: Ambulatory Visit

## 2016-05-13 DIAGNOSIS — Z1231 Encounter for screening mammogram for malignant neoplasm of breast: Secondary | ICD-10-CM | POA: Diagnosis not present

## 2016-05-14 DIAGNOSIS — M0579 Rheumatoid arthritis with rheumatoid factor of multiple sites without organ or systems involvement: Secondary | ICD-10-CM | POA: Diagnosis not present

## 2016-05-28 DIAGNOSIS — I10 Essential (primary) hypertension: Secondary | ICD-10-CM | POA: Diagnosis not present

## 2016-05-28 DIAGNOSIS — G453 Amaurosis fugax: Secondary | ICD-10-CM | POA: Diagnosis not present

## 2016-05-28 DIAGNOSIS — M0589 Other rheumatoid arthritis with rheumatoid factor of multiple sites: Secondary | ICD-10-CM | POA: Diagnosis not present

## 2016-05-28 DIAGNOSIS — I671 Cerebral aneurysm, nonruptured: Secondary | ICD-10-CM | POA: Diagnosis not present

## 2016-05-28 DIAGNOSIS — Z8614 Personal history of Methicillin resistant Staphylococcus aureus infection: Secondary | ICD-10-CM | POA: Diagnosis not present

## 2016-05-29 DIAGNOSIS — D62 Acute posthemorrhagic anemia: Secondary | ICD-10-CM | POA: Diagnosis not present

## 2016-05-29 DIAGNOSIS — G453 Amaurosis fugax: Secondary | ICD-10-CM | POA: Diagnosis not present

## 2016-05-29 DIAGNOSIS — I9589 Other hypotension: Secondary | ICD-10-CM | POA: Diagnosis not present

## 2016-05-29 DIAGNOSIS — E7800 Pure hypercholesterolemia, unspecified: Secondary | ICD-10-CM | POA: Diagnosis not present

## 2016-05-29 DIAGNOSIS — R571 Hypovolemic shock: Secondary | ICD-10-CM | POA: Diagnosis not present

## 2016-05-29 DIAGNOSIS — I701 Atherosclerosis of renal artery: Secondary | ICD-10-CM | POA: Diagnosis not present

## 2016-05-29 DIAGNOSIS — I97638 Postprocedural hematoma of a circulatory system organ or structure following other circulatory system procedure: Secondary | ICD-10-CM | POA: Diagnosis not present

## 2016-05-29 DIAGNOSIS — G543 Thoracic root disorders, not elsewhere classified: Secondary | ICD-10-CM | POA: Diagnosis not present

## 2016-05-29 DIAGNOSIS — R58 Hemorrhage, not elsewhere classified: Secondary | ICD-10-CM | POA: Diagnosis not present

## 2016-05-29 DIAGNOSIS — I671 Cerebral aneurysm, nonruptured: Secondary | ICD-10-CM | POA: Diagnosis not present

## 2016-05-29 DIAGNOSIS — D6959 Other secondary thrombocytopenia: Secondary | ICD-10-CM | POA: Diagnosis not present

## 2016-05-29 DIAGNOSIS — R918 Other nonspecific abnormal finding of lung field: Secondary | ICD-10-CM | POA: Diagnosis not present

## 2016-05-29 HISTORY — PX: INTRACRANIAL ANEURYSM REPAIR: SHX1839

## 2016-05-30 DIAGNOSIS — M96831 Postprocedural hemorrhage and hematoma of a musculoskeletal structure following other procedure: Secondary | ICD-10-CM | POA: Diagnosis not present

## 2016-05-30 DIAGNOSIS — G453 Amaurosis fugax: Secondary | ICD-10-CM | POA: Diagnosis not present

## 2016-05-30 DIAGNOSIS — D696 Thrombocytopenia, unspecified: Secondary | ICD-10-CM | POA: Diagnosis not present

## 2016-05-30 DIAGNOSIS — R571 Hypovolemic shock: Secondary | ICD-10-CM | POA: Diagnosis not present

## 2016-05-30 DIAGNOSIS — R58 Hemorrhage, not elsewhere classified: Secondary | ICD-10-CM | POA: Diagnosis not present

## 2016-05-30 DIAGNOSIS — D6959 Other secondary thrombocytopenia: Secondary | ICD-10-CM | POA: Diagnosis not present

## 2016-05-30 DIAGNOSIS — I10 Essential (primary) hypertension: Secondary | ICD-10-CM | POA: Diagnosis not present

## 2016-05-30 DIAGNOSIS — L7632 Postprocedural hematoma of skin and subcutaneous tissue following other procedure: Secondary | ICD-10-CM | POA: Diagnosis not present

## 2016-05-30 DIAGNOSIS — M0579 Rheumatoid arthritis with rheumatoid factor of multiple sites without organ or systems involvement: Secondary | ICD-10-CM | POA: Diagnosis not present

## 2016-05-30 DIAGNOSIS — M25551 Pain in right hip: Secondary | ICD-10-CM | POA: Diagnosis not present

## 2016-05-30 DIAGNOSIS — E7800 Pure hypercholesterolemia, unspecified: Secondary | ICD-10-CM | POA: Diagnosis not present

## 2016-05-30 DIAGNOSIS — I9589 Other hypotension: Secondary | ICD-10-CM | POA: Diagnosis not present

## 2016-05-30 DIAGNOSIS — D62 Acute posthemorrhagic anemia: Secondary | ICD-10-CM | POA: Diagnosis not present

## 2016-05-30 DIAGNOSIS — I671 Cerebral aneurysm, nonruptured: Secondary | ICD-10-CM | POA: Diagnosis not present

## 2016-06-05 DIAGNOSIS — I671 Cerebral aneurysm, nonruptured: Secondary | ICD-10-CM | POA: Diagnosis not present

## 2016-06-05 DIAGNOSIS — Z4801 Encounter for change or removal of surgical wound dressing: Secondary | ICD-10-CM | POA: Diagnosis not present

## 2016-06-05 DIAGNOSIS — T8189XA Other complications of procedures, not elsewhere classified, initial encounter: Secondary | ICD-10-CM | POA: Diagnosis not present

## 2016-06-05 DIAGNOSIS — M0589 Other rheumatoid arthritis with rheumatoid factor of multiple sites: Secondary | ICD-10-CM | POA: Diagnosis not present

## 2016-06-07 DIAGNOSIS — M0589 Other rheumatoid arthritis with rheumatoid factor of multiple sites: Secondary | ICD-10-CM | POA: Diagnosis not present

## 2016-06-07 DIAGNOSIS — I671 Cerebral aneurysm, nonruptured: Secondary | ICD-10-CM | POA: Diagnosis not present

## 2016-06-07 DIAGNOSIS — T8189XA Other complications of procedures, not elsewhere classified, initial encounter: Secondary | ICD-10-CM | POA: Diagnosis not present

## 2016-06-07 DIAGNOSIS — Z4801 Encounter for change or removal of surgical wound dressing: Secondary | ICD-10-CM | POA: Diagnosis not present

## 2016-06-10 DIAGNOSIS — Z4801 Encounter for change or removal of surgical wound dressing: Secondary | ICD-10-CM | POA: Diagnosis not present

## 2016-06-10 DIAGNOSIS — I671 Cerebral aneurysm, nonruptured: Secondary | ICD-10-CM | POA: Diagnosis not present

## 2016-06-10 DIAGNOSIS — T8189XA Other complications of procedures, not elsewhere classified, initial encounter: Secondary | ICD-10-CM | POA: Diagnosis not present

## 2016-06-10 DIAGNOSIS — M0589 Other rheumatoid arthritis with rheumatoid factor of multiple sites: Secondary | ICD-10-CM | POA: Diagnosis not present

## 2016-06-12 DIAGNOSIS — I671 Cerebral aneurysm, nonruptured: Secondary | ICD-10-CM | POA: Diagnosis not present

## 2016-06-12 DIAGNOSIS — M0589 Other rheumatoid arthritis with rheumatoid factor of multiple sites: Secondary | ICD-10-CM | POA: Diagnosis not present

## 2016-06-12 DIAGNOSIS — T8189XA Other complications of procedures, not elsewhere classified, initial encounter: Secondary | ICD-10-CM | POA: Diagnosis not present

## 2016-06-12 DIAGNOSIS — Z4801 Encounter for change or removal of surgical wound dressing: Secondary | ICD-10-CM | POA: Diagnosis not present

## 2016-06-13 ENCOUNTER — Ambulatory Visit: Payer: Medicare HMO | Admitting: Internal Medicine

## 2016-06-14 DIAGNOSIS — Z4801 Encounter for change or removal of surgical wound dressing: Secondary | ICD-10-CM | POA: Diagnosis not present

## 2016-06-14 DIAGNOSIS — T8189XA Other complications of procedures, not elsewhere classified, initial encounter: Secondary | ICD-10-CM | POA: Diagnosis not present

## 2016-06-14 DIAGNOSIS — M0589 Other rheumatoid arthritis with rheumatoid factor of multiple sites: Secondary | ICD-10-CM | POA: Diagnosis not present

## 2016-06-14 DIAGNOSIS — I671 Cerebral aneurysm, nonruptured: Secondary | ICD-10-CM | POA: Diagnosis not present

## 2016-06-17 DIAGNOSIS — I671 Cerebral aneurysm, nonruptured: Secondary | ICD-10-CM | POA: Diagnosis not present

## 2016-06-17 DIAGNOSIS — T8189XA Other complications of procedures, not elsewhere classified, initial encounter: Secondary | ICD-10-CM | POA: Diagnosis not present

## 2016-06-17 DIAGNOSIS — M0589 Other rheumatoid arthritis with rheumatoid factor of multiple sites: Secondary | ICD-10-CM | POA: Diagnosis not present

## 2016-06-17 DIAGNOSIS — Z4801 Encounter for change or removal of surgical wound dressing: Secondary | ICD-10-CM | POA: Diagnosis not present

## 2016-06-19 DIAGNOSIS — I671 Cerebral aneurysm, nonruptured: Secondary | ICD-10-CM | POA: Diagnosis not present

## 2016-06-19 DIAGNOSIS — Z4801 Encounter for change or removal of surgical wound dressing: Secondary | ICD-10-CM | POA: Diagnosis not present

## 2016-06-19 DIAGNOSIS — T8189XA Other complications of procedures, not elsewhere classified, initial encounter: Secondary | ICD-10-CM | POA: Diagnosis not present

## 2016-06-19 DIAGNOSIS — M0589 Other rheumatoid arthritis with rheumatoid factor of multiple sites: Secondary | ICD-10-CM | POA: Diagnosis not present

## 2016-06-21 DIAGNOSIS — I671 Cerebral aneurysm, nonruptured: Secondary | ICD-10-CM | POA: Diagnosis not present

## 2016-06-21 DIAGNOSIS — Z4801 Encounter for change or removal of surgical wound dressing: Secondary | ICD-10-CM | POA: Diagnosis not present

## 2016-06-21 DIAGNOSIS — M0589 Other rheumatoid arthritis with rheumatoid factor of multiple sites: Secondary | ICD-10-CM | POA: Diagnosis not present

## 2016-06-21 DIAGNOSIS — T8189XA Other complications of procedures, not elsewhere classified, initial encounter: Secondary | ICD-10-CM | POA: Diagnosis not present

## 2016-06-22 ENCOUNTER — Other Ambulatory Visit: Payer: Self-pay | Admitting: Internal Medicine

## 2016-06-23 DIAGNOSIS — M0589 Other rheumatoid arthritis with rheumatoid factor of multiple sites: Secondary | ICD-10-CM | POA: Diagnosis not present

## 2016-06-23 DIAGNOSIS — T8189XA Other complications of procedures, not elsewhere classified, initial encounter: Secondary | ICD-10-CM | POA: Diagnosis not present

## 2016-06-23 DIAGNOSIS — Z4801 Encounter for change or removal of surgical wound dressing: Secondary | ICD-10-CM | POA: Diagnosis not present

## 2016-06-23 DIAGNOSIS — I671 Cerebral aneurysm, nonruptured: Secondary | ICD-10-CM | POA: Diagnosis not present

## 2016-06-24 DIAGNOSIS — Z4801 Encounter for change or removal of surgical wound dressing: Secondary | ICD-10-CM | POA: Diagnosis not present

## 2016-06-24 DIAGNOSIS — I671 Cerebral aneurysm, nonruptured: Secondary | ICD-10-CM | POA: Diagnosis not present

## 2016-06-24 DIAGNOSIS — T8189XA Other complications of procedures, not elsewhere classified, initial encounter: Secondary | ICD-10-CM | POA: Diagnosis not present

## 2016-06-24 DIAGNOSIS — M0589 Other rheumatoid arthritis with rheumatoid factor of multiple sites: Secondary | ICD-10-CM | POA: Diagnosis not present

## 2016-06-27 DIAGNOSIS — T8189XA Other complications of procedures, not elsewhere classified, initial encounter: Secondary | ICD-10-CM | POA: Diagnosis not present

## 2016-06-27 DIAGNOSIS — I671 Cerebral aneurysm, nonruptured: Secondary | ICD-10-CM | POA: Diagnosis not present

## 2016-06-27 DIAGNOSIS — Z4801 Encounter for change or removal of surgical wound dressing: Secondary | ICD-10-CM | POA: Diagnosis not present

## 2016-06-27 DIAGNOSIS — M0589 Other rheumatoid arthritis with rheumatoid factor of multiple sites: Secondary | ICD-10-CM | POA: Diagnosis not present

## 2016-06-28 ENCOUNTER — Telehealth: Payer: Self-pay

## 2016-06-28 NOTE — Telephone Encounter (Signed)
Patient is on the list for [employer] 2017 and may be a good candidate for an AWV in 2017. Please let me know if/when appt is scheduled.   

## 2016-06-28 NOTE — Telephone Encounter (Signed)
Noted. Will follow as appropriate.  

## 2016-07-01 DIAGNOSIS — T8189XA Other complications of procedures, not elsewhere classified, initial encounter: Secondary | ICD-10-CM | POA: Diagnosis not present

## 2016-07-01 DIAGNOSIS — Z4801 Encounter for change or removal of surgical wound dressing: Secondary | ICD-10-CM | POA: Diagnosis not present

## 2016-07-01 DIAGNOSIS — M0589 Other rheumatoid arthritis with rheumatoid factor of multiple sites: Secondary | ICD-10-CM | POA: Diagnosis not present

## 2016-07-01 DIAGNOSIS — I671 Cerebral aneurysm, nonruptured: Secondary | ICD-10-CM | POA: Diagnosis not present

## 2016-07-08 ENCOUNTER — Other Ambulatory Visit: Payer: Self-pay

## 2016-07-08 MED ORDER — AMLODIPINE BESYLATE 2.5 MG PO TABS
ORAL_TABLET | ORAL | Status: DC
Start: 2016-07-08 — End: 2017-01-02

## 2016-07-10 NOTE — Telephone Encounter (Signed)
Patient will call back and schedule an appointment at a later date.

## 2016-07-17 DIAGNOSIS — Z79899 Other long term (current) drug therapy: Secondary | ICD-10-CM | POA: Diagnosis not present

## 2016-07-17 DIAGNOSIS — S301XXD Contusion of abdominal wall, subsequent encounter: Secondary | ICD-10-CM | POA: Diagnosis not present

## 2016-07-17 DIAGNOSIS — Z8679 Personal history of other diseases of the circulatory system: Secondary | ICD-10-CM | POA: Diagnosis not present

## 2016-07-17 DIAGNOSIS — Z7902 Long term (current) use of antithrombotics/antiplatelets: Secondary | ICD-10-CM | POA: Diagnosis not present

## 2016-07-17 DIAGNOSIS — X58XXXD Exposure to other specified factors, subsequent encounter: Secondary | ICD-10-CM | POA: Diagnosis not present

## 2016-07-17 DIAGNOSIS — Z7982 Long term (current) use of aspirin: Secondary | ICD-10-CM | POA: Diagnosis not present

## 2016-07-17 DIAGNOSIS — Z9889 Other specified postprocedural states: Secondary | ICD-10-CM | POA: Diagnosis not present

## 2016-07-17 DIAGNOSIS — I671 Cerebral aneurysm, nonruptured: Secondary | ICD-10-CM | POA: Diagnosis not present

## 2016-07-23 DIAGNOSIS — M0579 Rheumatoid arthritis with rheumatoid factor of multiple sites without organ or systems involvement: Secondary | ICD-10-CM | POA: Diagnosis not present

## 2016-08-16 ENCOUNTER — Ambulatory Visit
Admission: EM | Admit: 2016-08-16 | Discharge: 2016-08-16 | Disposition: A | Discharge status: Home / Self Care | Payer: Medicare HMO | Attending: Internal Medicine | Admitting: Internal Medicine

## 2016-08-16 DIAGNOSIS — N390 Urinary tract infection, site not specified: Secondary | ICD-10-CM | POA: Diagnosis not present

## 2016-08-16 HISTORY — DX: Family history of ischemic heart disease and other diseases of the circulatory system: Z82.49

## 2016-08-16 LAB — URINALYSIS COMPLETE WITH MICROSCOPIC (ARMC ONLY)
BILIRUBIN URINE: NEGATIVE
Glucose, UA: NEGATIVE mg/dL
Ketones, ur: NEGATIVE mg/dL
Nitrite: NEGATIVE
PH: 6 (ref 5.0–8.0)
Protein, ur: NEGATIVE mg/dL
Specific Gravity, Urine: 1.01 (ref 1.005–1.030)

## 2016-08-16 MED ORDER — NITROFURANTOIN MONOHYD MACRO 100 MG PO CAPS: 100.0000 mg | ORAL_CAPSULE | Freq: Two times a day (BID) | ORAL | 0 refills | Status: DC

## 2016-08-16 NOTE — ED Triage Notes (Signed)
Patient complains of burning with urination, lower abdominal pressure. Patient states that she thinks her bladder may have prolapsed but, is unsure. Patient states that urinary symptoms started Monday.

## 2016-08-16 NOTE — ED Provider Notes (Signed)
[facility]    CSN: [id number] Arrival date & time: 08/16/16  9449  First Provider Contact:  First MD Initiated Contact with Patient 08/16/16 1851        History   Chief Complaint Chief Complaint  Patient presents with  . Dysuria    HPI Erica Little is a 70 y.o. female. She presents today with about a 4 day history of burning with urination, sensation of pelvic pressure. Today, she had some malaise and tactile temperature. Little bit of nausea, no vomiting, no diarrhea. She had a normal bowel movement today. No vaginal bleeding, equivocal vaginal discharge and possibly some chafing. She thinks there might be some pelvic prolapse. She is planning to have her primary care provider check this at a checkup on September 1.    HPI  Past Medical History:  Diagnosis Date  . Family history of brain aneurysm   . GERD (gastroesophageal reflux disease)   . Hyperlipidemia   . Hypertension   . MRSA (methicillin resistant Staphylococcus aureus)    skin infections  . Rheumatoid arthritis(714.0)    transaminitis on MTX, remicade rxn, s/p sulfasalazine, orencia  . Ulcer disease     Patient Active Problem List   Diagnosis Date Noted  . Amaurosis fugax 04/08/2016  . Visual disturbance 03/17/2016  . Acute bronchitis 11/21/2015  . Elevated random blood glucose level 11/21/2015  . Chest fullness 05/08/2015  . Health care maintenance 05/03/2015  . Family history of colonic polyps 01/29/2015  . Ataxia 01/07/2015  . Pressure in head 01/07/2015  . Dizziness 12/26/2014  . Herpes zoster 10/06/2014  . Gastritis 10/06/2014  . Abdominal bruit 09/21/2014  . Carotid bruit 09/21/2014  . Vaginal irritation 08/14/2014  . Cough 04/24/2014  . Pain of left calf 12/11/2013  . Osteoporosis 01/11/2013  . Rheumatoid arthritis (HCC) 12/17/2012  . Hypertension 10/11/2012  . Hypercholesteremia 10/11/2012  . Hyponatremia 10/11/2012    Past Surgical History:  Procedure Laterality Date    . CHOLECYSTECTOMY  2010  . INTRACRANIAL ANEURYSM REPAIR  05/29/2016  . MCP replacements  2001   left  . TUBAL LIGATION       Home Medications    Prior to Admission medications   Medication Sig Start Date End Date Taking? Authorizing Provider  abatacept (ORENCIA) 250 MG injection Once every month   Yes Historical Provider, MD  albuterol (PROVENTIL HFA;VENTOLIN HFA) 108 (90 BASE) MCG/ACT inhaler Inhale 2 puffs into the lungs every 6 (six) hours as needed for wheezing or shortness of breath. 11/18/15  Yes Minna Antis, MD  amLODipine (NORVASC) 2.5 MG tablet TAKE 1 TABLET (2.5 MG TOTAL) BY MOUTH DAILY. 07/08/16  Yes Dale [place], MD  aspirin 81 MG chewable tablet Chew 81 mg by mouth daily.   Yes Historical Provider, MD  calcium gluconate 650 MG tablet Take 650 mg by mouth daily.   Yes Historical Provider, MD  cholecalciferol (VITAMIN D) 1000 UNITS tablet Take 1,000 Units by mouth daily.   Yes Historical Provider, MD  CRESTOR 5 MG tablet TAKE 1 TABLET (5 MG TOTAL) BY MOUTH DAILY. 07/12/15  Yes Dale [place], MD  dabigatran (PRADAXA) 75 MG CAPS capsule Take 75 mg by mouth 2 (two) times daily.   Yes Historical Provider, MD  fluticasone (FLONASE) 50 MCG/ACT nasal spray Place 2 sprays into the nose daily as needed. 10/29/12  Yes Dale [place], MD  losartan (COZAAR) 100 MG tablet TAKE 1 TABLET (100 MG TOTAL) BY MOUTH DAILY. 12/22/15  Yes Dale , MD  magnesium oxide (MAG-OX) 400 MG tablet Take 400 mg by mouth daily.   Yes Historical Provider, MD  methotrexate (RHEUMATREX) 2.5 MG tablet Take 2.5 mg by mouth once a week.    Yes Historical Provider, MD  omeprazole (PRILOSEC) 20 MG capsule TAKE ONE CAPSULE BY MOUTH EVERY DAY 06/22/16  Yes Dale [place]sey, MD    Family History Family History  Problem Relation Age of Onset  . Heart disease Mother   . Diabetes Mother   . Hypercholesterolemia Mother   . Heart disease Father   . Heart disease Brother     MI age 13  .  Hypercholesterolemia Sister   . Colon polyps Sister   . Arthritis/Rheumatoid Paternal Aunt   . Arthritis/Rheumatoid Paternal Uncle   . Breast cancer Neg Hx     Social History Social History  Substance Use Topics  . Smoking status: Former Smoker    Quit date: 12/30/1988  . Smokeless tobacco: Never Used  . Alcohol use No     Allergies   Remicade [infliximab]; Simvastatin; Sulfa antibiotics; Zetia [ezetimibe]; and Latex   Review of Systems Review of Systems  All other systems reviewed and are negative.    Physical Exam Triage Vital Signs ED Triage Vitals  Enc Vitals Group     BP 08/16/16 1844 (!) 133/56     Pulse Rate 08/16/16 1844 92     Resp 08/16/16 1844 17     Temp 08/16/16 1844 99.3 F (37.4 C)     Temp Source 08/16/16 1844 Oral     SpO2 08/16/16 1844 100 %     Weight 08/16/16 1843 115 lb (52.2 kg)     Height 08/16/16 1843 5' (1.524 m)     Pain Score 08/16/16 1849 1    Updated Vital Signs BP (!) 133/56 (BP Location: Left Arm)   Pulse 92   Temp 99.3 F (37.4 C) (Oral)   Resp 17   Ht 5' (1.524 m)   Wt 115 lb (52.2 kg)   SpO2 100%   BMI 22.46 kg/m  Physical Exam  Constitutional: She is oriented to person, place, and time. No distress.  Alert, nicely groomed  HENT:  Head: Atraumatic.  Eyes:  Conjugate gaze, no eye redness/drainage  Neck: Neck supple.  Cardiovascular: Normal rate.   Pulmonary/Chest: No respiratory distress.  Abdominal: She exhibits no distension.  Musculoskeletal: Normal range of motion.  No leg swelling  Neurological: She is alert and oriented to person, place, and time.  Skin: Skin is warm and dry.  No cyanosis  Nursing note and vitals reviewed.    UC Treatments / Results  Labs (all labs ordered are listed, but only abnormal results are displayed) Results for orders placed or performed during the hospital encounter of 08/16/16  Urinalysis complete, with microscopic  Result Value Ref Range   Color, Urine YELLOW YELLOW    APPearance CLEAR CLEAR   Glucose, UA NEGATIVE NEGATIVE mg/dL   Bilirubin Urine NEGATIVE NEGATIVE   Ketones, ur NEGATIVE NEGATIVE mg/dL   Specific Gravity, Urine 1.010 1.005 - 1.030   Hgb urine dipstick TRACE (A) NEGATIVE   pH 6.0 5.0 - 8.0   Protein, ur NEGATIVE NEGATIVE mg/dL   Nitrite NEGATIVE NEGATIVE   Leukocytes, UA SMALL (A) NEGATIVE   RBC / HPF 0-5 0 - 5 RBC/hpf   WBC, UA 6-30 0 - 5 WBC/hpf   Bacteria, UA FEW (A) NONE SEEN   Squamous Epithelial / LPF 0-5 (A) NONE SEEN  Procedures Procedures (including critical care time)  none  Final Clinical Impressions(s) / UC Diagnoses   Final diagnoses:  UTI (lower urinary tract infection)   Meds ordered this encounter  . nitrofurantoin, macrocrystal-monohydrate, (MACROBID) 100 MG capsule    Sig: Take 1 capsule (100 mg total) by mouth 2 (two) times daily.    Dispense:  10 capsule    Refill:  0   Urine culture pending. followup as planned with PCP on 08/30/16.  Further evaluation if symptoms do not resolve.   Eustace Moore, MD 08/16/16 2002

## 2016-08-18 LAB — URINE CULTURE: Culture: <10000 — AB

## 2016-08-19 ENCOUNTER — Ambulatory Visit (INDEPENDENT_AMBULATORY_CARE_PROVIDER_SITE_OTHER): Payer: Medicare HMO | Admitting: Family Medicine

## 2016-08-19 ENCOUNTER — Encounter: Payer: Self-pay | Admitting: Family Medicine

## 2016-08-19 VITALS — BP 125/75 | HR 93 | Temp 98.5°F | Wt 112.1 lb

## 2016-08-19 DIAGNOSIS — A6000 Herpesviral infection of urogenital system, unspecified: Secondary | ICD-10-CM | POA: Diagnosis not present

## 2016-08-19 DIAGNOSIS — N898 Other specified noninflammatory disorders of vagina: Secondary | ICD-10-CM | POA: Diagnosis not present

## 2016-08-19 DIAGNOSIS — R300 Dysuria: Secondary | ICD-10-CM | POA: Diagnosis not present

## 2016-08-19 DIAGNOSIS — R69 Illness, unspecified: Secondary | ICD-10-CM | POA: Diagnosis not present

## 2016-08-19 LAB — POCT URINALYSIS DIPSTICK
GLUCOSE UA: NEGATIVE
KETONES UA: 40
Nitrite, UA: NEGATIVE
PROTEIN UA: 30
Spec Grav, UA: 1.01
Urobilinogen, UA: 0.2
pH, UA: 5.5

## 2016-08-19 MED ORDER — VALACYCLOVIR HCL 1 G PO TABS
1000.0000 mg | ORAL_TABLET | Freq: Two times a day (BID) | ORAL | 0 refills | Status: DC
Start: 2016-08-19 — End: 2019-09-20

## 2016-08-19 NOTE — Progress Notes (Signed)
Pre visit review using our clinic review tool, if applicable. No additional management support is needed unless otherwise documented below in the visit note. 

## 2016-08-19 NOTE — Assessment & Plan Note (Signed)
New problem. Recent urine culture was negative. Symptoms are not really consistent with UTI. See separate problem.

## 2016-08-19 NOTE — Patient Instructions (Signed)
Take the medication as prescribed.  Follow up with Dr. Lorin Picket.  Take care  Dr. Adriana Simas

## 2016-08-19 NOTE — Progress Notes (Signed)
Subjective:  Patient ID: Erica Little, female    DOB: 10/02/1946  Age: 70 y.o. MRN: [id number]  CC: Pain with urination  HPI:  70 year old female presents with the above complaints.  Patient was seen at urgent care on 8/18. She was seen with the same complaints. She was diagnosed with urinary tract infection and culture was obtained. She was placed on empiric antibiotics.  Patient presents today with continued complaints of pain with urination. She states that she feels poorly. She states that she is somewhat nauseated. She reports vaginal pain as well as discharge. She's had some improvement with antibiotics. However, she's had no resolution in her symptoms. She is concerned about her symptoms. No associated fevers or chills. No reports of abdominal pain. No other complaints at this time.  Social Hx   Social History   Social History  . Marital status: Widowed    Spouse name: N/A  . Number of children: 1  . Years of education: N/A   Social History Main Topics  . Smoking status: Former Smoker    Quit date: 12/30/1988  . Smokeless tobacco: Never Used  . Alcohol use No  . Drug use: No  . Sexual activity: Not Asked   Other Topics Concern  . None   Social History Narrative  . None   Review of Systems  Constitutional: Negative for fever.  Gastrointestinal: Positive for nausea. Negative for abdominal pain.  Genitourinary: Positive for dysuria, vaginal discharge and vaginal pain.   Objective:  BP 125/75 (BP Location: Right Arm, Patient Position: Sitting, Cuff Size: Normal)   Pulse 93   Temp 98.5 F (36.9 C) (Oral)   Wt 112 lb 2 oz (50.9 kg)   SpO2 99%   BMI 21.90 kg/m   BP/Weight 08/19/2016 08/16/2016 04/03/2016  Systolic BP 125 133 118  Diastolic BP 75 56 80  Wt. (Lbs) 112.13 115 116  BMI 21.9 22.46 21.21   Physical Exam  Constitutional: She appears well-developed. No distress.  Cardiovascular: Regular rhythm.  Tachycardia present.   Pulmonary/Chest: Effort normal.  She has no wheezes. She has no rales.  Abdominal: Soft. She exhibits no distension. There is no tenderness. There is no rebound and no guarding.  Genitourinary:  Genitourinary Comments: Pelvic Exam: External: Patient has several painful ulcerations. Sample for wet prep and HSV swab obtained.    Neurological: She is alert.  Psychiatric: She has a normal mood and affect.  Vitals reviewed.  Lab Results  Component Value Date   WBC 6.2 03/27/2016   HGB 12.2 03/27/2016   HCT 37.0 03/27/2016   PLT 238.0 03/27/2016   GLUCOSE 88 03/27/2016   CHOL 190 03/27/2016   TRIG 117.0 03/27/2016   HDL 45.00 03/27/2016   LDLDIRECT 167.8 12/20/2013   LDLCALC 122 (H) 03/27/2016   ALT 9 03/27/2016   AST 15 03/27/2016   NA 134 (L) 04/18/2016   K 4.7 03/27/2016   CL 101 03/27/2016   CREATININE 0.95 03/27/2016   BUN 11 03/27/2016   CO2 23 03/27/2016   TSH 2.87 03/27/2016   INR 1.1 (H) 12/06/2013   HGBA1C 5.8 03/27/2016   Assessment & Plan:   Problem List Items Addressed This Visit    Genital herpes    New problem. Exam appears to be consistent with a HSV outbreak. Sample obtained.  Treating with Valtrex.       Relevant Medications   valACYclovir (VALTREX) 1000 MG tablet   Other Relevant Orders   WET PREP BY MOLECULAR PROBE  Herpes simplex virus culture   Dysuria - Primary    New problem. Recent urine culture was negative. Symptoms are not really consistent with UTI. See separate problem.      Relevant Orders   POCT Urinalysis Dipstick (Completed)   Urine Culture   Herpes simplex virus culture    Other Visit Diagnoses   None.    Meds ordered this encounter  Medications  . valACYclovir (VALTREX) 1000 MG tablet    Sig: Take 1 tablet (1,000 mg total) by mouth 2 (two) times daily.    Dispense:  20 tablet    Refill:  0   Follow-up: PRN  Everlene Other DO [facility]

## 2016-08-19 NOTE — Assessment & Plan Note (Signed)
New problem. Exam appears to be consistent with a HSV outbreak. Sample obtained.  Treating with Valtrex.

## 2016-08-20 ENCOUNTER — Telehealth: Payer: Self-pay

## 2016-08-20 LAB — WET PREP BY MOLECULAR PROBE
CANDIDA SPECIES: NEGATIVE
Gardnerella vaginalis: NEGATIVE
Trichomonas vaginosis: NEGATIVE

## 2016-08-20 LAB — URINE CULTURE: ORGANISM ID, BACTERIA: NO GROWTH

## 2016-08-20 NOTE — Telephone Encounter (Signed)
-----   Message from Eustace Moore, MD sent at 08/20/2016  7:51 AM EDT ----- Urine culture does not clearly demonstrate a UTI.  Recheck or followup with PCP/Charlene Scott for further evaluation if symptoms persist.  LM

## 2016-08-21 ENCOUNTER — Telehealth: Payer: Self-pay | Admitting: Internal Medicine

## 2016-08-21 LAB — HERPES SIMPLEX VIRUS CULTURE: ORGANISM ID, BACTERIA: DETECTED

## 2016-08-21 NOTE — Telephone Encounter (Signed)
Patient has been informed that results  Have not been interpreted by provider.

## 2016-08-21 NOTE — Telephone Encounter (Signed)
Pt called to get her results from 08/19/16? Please advise? Thank you!  Call pt @ [phone].

## 2016-08-30 ENCOUNTER — Ambulatory Visit (INDEPENDENT_AMBULATORY_CARE_PROVIDER_SITE_OTHER): Payer: Medicare HMO | Admitting: Internal Medicine

## 2016-08-30 ENCOUNTER — Encounter: Payer: Self-pay | Admitting: Internal Medicine

## 2016-08-30 VITALS — BP 120/80 | HR 72 | Temp 98.3°F | Resp 18 | Ht 62.0 in | Wt 114.0 lb

## 2016-08-30 DIAGNOSIS — A6000 Herpesviral infection of urogenital system, unspecified: Secondary | ICD-10-CM

## 2016-08-30 DIAGNOSIS — E7800 Pure hypercholesterolemia, unspecified: Secondary | ICD-10-CM | POA: Diagnosis not present

## 2016-08-30 DIAGNOSIS — I10 Essential (primary) hypertension: Secondary | ICD-10-CM

## 2016-08-30 DIAGNOSIS — M069 Rheumatoid arthritis, unspecified: Secondary | ICD-10-CM

## 2016-08-30 DIAGNOSIS — G453 Amaurosis fugax: Secondary | ICD-10-CM

## 2016-08-30 DIAGNOSIS — R739 Hyperglycemia, unspecified: Secondary | ICD-10-CM | POA: Diagnosis not present

## 2016-08-30 MED ORDER — ACYCLOVIR 400 MG PO TABS: 400.0000 mg | ORAL_TABLET | Freq: Three times a day (TID) | ORAL | 0 refills | Status: DC

## 2016-08-30 NOTE — Progress Notes (Signed)
Pre-visit discussion using our clinic review tool. No additional management support is needed unless otherwise documented below in the visit note.  

## 2016-08-31 ENCOUNTER — Encounter: Payer: Self-pay | Admitting: Internal Medicine

## 2016-08-31 NOTE — Progress Notes (Signed)
Patient ID: Erica Little, female   DOB: May 17, 1946, 70 y.o.   MRN: [id number]   Subjective:    Patient ID: Erica Little, female    DOB: Mar 16, 1946, 70 y.o.   MRN: [id number]  HPI  Patient here for a scheduled follow up.  She is upset about a recent diagnosis of genital herpes.  Took the valtrex.  Is better.  Wants me to recheck.  Increased stress related to this.  Discussed at length with her.  She states otherwise she has been doing well.  No chest pain.  No sob.  No acid reflux.  No abdominal pain or cramping.  Bowels stable.  She is s/p PED of right ICA cavernous and supraclinoid aneurysms 05/29/16 complicated by right groin hematoma s/p evacuation.  Doing well.  Maintained on aspirin and plavix.  Followed by Dr Arlyss Queen.  No headache.     Past Medical History:  Diagnosis Date  . Family history of brain aneurysm   . GERD (gastroesophageal reflux disease)   . Hyperlipidemia   . Hypertension   . MRSA (methicillin resistant Staphylococcus aureus)    skin infections  . Rheumatoid arthritis(714.0)    transaminitis on MTX, remicade rxn, s/p sulfasalazine, orencia  . Ulcer disease    Past Surgical History:  Procedure Laterality Date  . CHOLECYSTECTOMY  2010  . INTRACRANIAL ANEURYSM REPAIR  05/29/2016  . MCP replacements  2001   left  . TUBAL LIGATION     Family History  Problem Relation Age of Onset  . Heart disease Mother   . Diabetes Mother   . Hypercholesterolemia Mother   . Heart disease Father   . Heart disease Brother     MI age 22  . Hypercholesterolemia Sister   . Colon polyps Sister   . Arthritis/Rheumatoid Paternal Aunt   . Arthritis/Rheumatoid Paternal Uncle   . Breast cancer Neg Hx    Social History   Social History  . Marital status: Widowed    Spouse name: N/A  . Number of children: 1  . Years of education: N/A   Social History Main Topics  . Smoking status: Former Smoker    Quit date: 12/30/1988  . Smokeless tobacco: Never Used  . Alcohol use No  .  Drug use: No  . Sexual activity: Not Asked   Other Topics Concern  . None   Social History Narrative  . None    Outpatient Encounter Prescriptions as of 08/30/2016  Medication Sig  . abatacept (ORENCIA) 250 MG injection Once every month  . albuterol (PROVENTIL HFA;VENTOLIN HFA) 108 (90 BASE) MCG/ACT inhaler Inhale 2 puffs into the lungs every 6 (six) hours as needed for wheezing or shortness of breath.  Marland Kitchen amLODipine (NORVASC) 2.5 MG tablet TAKE 1 TABLET (2.5 MG TOTAL) BY MOUTH DAILY.  Marland Kitchen aspirin 81 MG chewable tablet Chew 81 mg by mouth daily.  . calcium gluconate 650 MG tablet Take 650 mg by mouth daily.  . cholecalciferol (VITAMIN D) 1000 UNITS tablet Take 1,000 Units by mouth daily.  . clopidogrel (PLAVIX) 75 MG tablet Take 75 mg by mouth daily.  . CRESTOR 5 MG tablet TAKE 1 TABLET (5 MG TOTAL) BY MOUTH DAILY.  . dabigatran (PRADAXA) 75 MG CAPS capsule Take 75 mg by mouth 2 (two) times daily.  . fluticasone (FLONASE) 50 MCG/ACT nasal spray Place 2 sprays into the nose daily as needed.  Marland Kitchen losartan (COZAAR) 100 MG tablet TAKE 1 TABLET (100 MG TOTAL) BY MOUTH DAILY.  Marland Kitchen  magnesium oxide (MAG-OX) 400 MG tablet Take 400 mg by mouth daily.  . methotrexate (RHEUMATREX) 2.5 MG tablet Take 2.5 mg by mouth once a week.   . ranitidine (ZANTAC) 150 MG capsule Take 150 mg by mouth daily.  . valACYclovir (VALTREX) 1000 MG tablet Take 1 tablet (1,000 mg total) by mouth 2 (two) times daily.  . [DISCONTINUED] nitrofurantoin, macrocrystal-monohydrate, (MACROBID) 100 MG capsule Take 1 capsule (100 mg total) by mouth 2 (two) times daily.  . [DISCONTINUED] omeprazole (PRILOSEC) 20 MG capsule TAKE ONE CAPSULE BY MOUTH EVERY DAY  . acyclovir (ZOVIRAX) 400 MG tablet Take 1 tablet (400 mg total) by mouth 3 (three) times daily.   No facility-administered encounter medications on file as of 08/30/2016.     Review of Systems  Constitutional: Negative for appetite change and unexpected weight change.  HENT:  Negative for congestion and sinus pressure.   Respiratory: Negative for cough, chest tightness and shortness of breath.   Cardiovascular: Negative for chest pain, palpitations and leg swelling.  Gastrointestinal: Negative for abdominal pain, diarrhea, nausea and vomiting.  Genitourinary: Negative for difficulty urinating and dysuria.       Vaginal symptoms improved.  No lesions now.    Musculoskeletal:       Has joint pain - RA.  Followed by rheumatology.   Skin: Negative for color change and rash.  Neurological: Negative for dizziness, light-headedness and headaches.  Psychiatric/Behavioral:       Increased stress related to her recent herpes diagnosis.         Objective:    Physical Exam  Constitutional: She appears well-developed and well-nourished. No distress.  HENT:  Nose: Nose normal.  Mouth/Throat: Oropharynx is clear and moist.  Neck: Neck supple. No thyromegaly present.  Cardiovascular: Normal rate and regular rhythm.   Pulmonary/Chest: Breath sounds normal. No respiratory distress. She has no wheezes.  Abdominal: Soft. Bowel sounds are normal. There is no tenderness.  Genitourinary:  Genitourinary Comments: GU exam - no rash noted.  No increased erythema.    Musculoskeletal: She exhibits no edema or tenderness.  Lymphadenopathy:    She has no cervical adenopathy.  Skin: No rash noted. No erythema.  Psychiatric: She has a normal mood and affect. Her behavior is normal.    BP 120/80   Pulse 72   Temp 98.3 F (36.8 C) (Oral)   Resp 18   Ht 5\' 2"  (1.575 m)   Wt 114 lb (51.7 kg)   SpO2 92%   BMI 20.85 kg/m  Wt Readings from Last 3 Encounters:  08/30/16 114 lb (51.7 kg)  08/19/16 112 lb 2 oz (50.9 kg)  08/16/16 115 lb (52.2 kg)     Lab Results  Component Value Date   WBC 6.2 03/27/2016   HGB 12.2 03/27/2016   HCT 37.0 03/27/2016   PLT 238.0 03/27/2016   GLUCOSE 88 03/27/2016   CHOL 190 03/27/2016   TRIG 117.0 03/27/2016   HDL 45.00 03/27/2016    LDLDIRECT 167.8 12/20/2013   LDLCALC 122 (H) 03/27/2016   ALT 9 03/27/2016   AST 15 03/27/2016   NA 134 (L) 04/18/2016   K 4.7 03/27/2016   CL 101 03/27/2016   CREATININE 0.95 03/27/2016   BUN 11 03/27/2016   CO2 23 03/27/2016   TSH 2.87 03/27/2016   INR 1.1 (H) 12/06/2013   HGBA1C 5.8 03/27/2016    No results found.     Assessment & Plan:   Problem List Items Addressed This Visit  Amaurosis fugax    Is now s/p PED of right ICA for cavernous and supraclinoid aneurysms 05/29/16.  On plavix and aspirin.  Doing well.  Follow.  Followed by Dr Arlyss Queen.        Genital herpes    S/p treatment with valtrex.  Improved/resolved.  Gave her rx for acyclovir if needed.  Discussed with her at length.  Follow.        Relevant Medications   acyclovir (ZOVIRAX) 400 MG tablet   Hypercholesteremia    Low cholesterol diet and exercise.  Follow lipid panel and liver function tests.  On crestor.        Relevant Orders   Lipid panel   Hepatic function panel   Hypertension    Blood pressure under good control.  Continue same medication regimen.  Follow pressures.  Follow metabolic panel.        Relevant Orders   Basic metabolic panel   TSH   Rheumatoid arthritis (HCC)    Followed by Dr Gavin Potters.  Off orencia now given recent diagnosis of genital herpes.        Relevant Orders   CBC with Differential/Platelet    Other Visit Diagnoses    Hyperglycemia    -  Primary   Relevant Orders   Hemoglobin A1c       Dale [place], MD

## 2016-08-31 NOTE — Assessment & Plan Note (Signed)
Low cholesterol diet and exercise.  Follow lipid panel and liver function tests.  On crestor.   

## 2016-08-31 NOTE — Assessment & Plan Note (Signed)
Blood pressure under good control.  Continue same medication regimen.  Follow pressures.  Follow metabolic panel.   

## 2016-08-31 NOTE — Assessment & Plan Note (Signed)
S/p treatment with valtrex.  Improved/resolved.  Gave her rx for acyclovir if needed.  Discussed with her at length.  Follow.

## 2016-08-31 NOTE — Assessment & Plan Note (Signed)
Followed by Dr Gavin Potters.  Off orencia now given recent diagnosis of genital herpes.

## 2016-08-31 NOTE — Assessment & Plan Note (Signed)
Is now s/p PED of right ICA for cavernous and supraclinoid aneurysms 05/29/16.  On plavix and aspirin.  Doing well.  Follow.  Followed by Dr Arlyss Queen.

## 2016-09-04 DIAGNOSIS — M7061 Trochanteric bursitis, right hip: Secondary | ICD-10-CM | POA: Diagnosis not present

## 2016-09-04 DIAGNOSIS — M7062 Trochanteric bursitis, left hip: Secondary | ICD-10-CM | POA: Diagnosis not present

## 2016-09-04 DIAGNOSIS — M0579 Rheumatoid arthritis with rheumatoid factor of multiple sites without organ or systems involvement: Secondary | ICD-10-CM | POA: Diagnosis not present

## 2016-09-05 ENCOUNTER — Other Ambulatory Visit (INDEPENDENT_AMBULATORY_CARE_PROVIDER_SITE_OTHER): Payer: Medicare HMO

## 2016-09-05 DIAGNOSIS — I10 Essential (primary) hypertension: Secondary | ICD-10-CM | POA: Diagnosis not present

## 2016-09-05 DIAGNOSIS — M069 Rheumatoid arthritis, unspecified: Secondary | ICD-10-CM

## 2016-09-05 DIAGNOSIS — E7800 Pure hypercholesterolemia, unspecified: Secondary | ICD-10-CM

## 2016-09-05 DIAGNOSIS — R739 Hyperglycemia, unspecified: Secondary | ICD-10-CM

## 2016-09-05 DIAGNOSIS — E871 Hypo-osmolality and hyponatremia: Secondary | ICD-10-CM | POA: Diagnosis not present

## 2016-09-05 LAB — BASIC METABOLIC PANEL
BUN: 11 mg/dL (ref 6–23)
CALCIUM: 9 mg/dL (ref 8.4–10.5)
CO2: 26 mEq/L (ref 19–32)
CREATININE: 0.92 mg/dL (ref 0.40–1.20)
Chloride: 100 mEq/L (ref 96–112)
GFR: 63.83 mL/min (ref >60.00)
Glucose, Bld: 87 mg/dL (ref 70–99)
Potassium: 3.9 mEq/L (ref 3.5–5.1)
SODIUM: 132 meq/L — AB (ref 135–145)

## 2016-09-05 LAB — CBC WITH DIFFERENTIAL/PLATELET
BASOS ABS: 0.1 10*3/uL (ref 0.0–0.1)
Basophils Relative: 0.8 % (ref 0.0–3.0)
EOS PCT: 6.2 % — AB (ref 0.0–5.0)
Eosinophils Absolute: 0.5 10*3/uL (ref 0.0–0.7)
HCT: 39.7 % (ref 36.0–46.0)
HEMOGLOBIN: 13.4 g/dL (ref 12.0–15.0)
LYMPHS ABS: 2.1 10*3/uL (ref 0.7–4.0)
Lymphocytes Relative: 28.7 % (ref 12.0–46.0)
MCHC: 33.7 g/dL (ref 30.0–36.0)
MCV: 90.3 fl (ref 78.0–100.0)
MONO ABS: 0.5 10*3/uL (ref 0.1–1.0)
Monocytes Relative: 7.1 % (ref 3.0–12.0)
NEUTROS PCT: 57.2 % (ref 43.0–77.0)
Neutro Abs: 4.3 10*3/uL (ref 1.4–7.7)
Platelets: 235 10*3/uL (ref 150.0–400.0)
RBC: 4.4 Mil/uL (ref 3.87–5.11)
RDW: 15.2 % (ref 11.5–15.5)
WBC: 7.5 10*3/uL (ref 4.0–10.5)

## 2016-09-05 LAB — LIPID PANEL
CHOLESTEROL: 227 mg/dL — AB (ref 0–200)
HDL: 37.9 mg/dL — AB (ref >39.00)
LDL CALC: 155 mg/dL — AB (ref 0–99)
NonHDL: 189.02
TRIGLYCERIDES: 172 mg/dL — AB (ref 0.0–149.0)
Total CHOL/HDL Ratio: 6
VLDL: 34.4 mg/dL (ref 0.0–40.0)

## 2016-09-05 LAB — HEPATIC FUNCTION PANEL
ALBUMIN: 4.1 g/dL (ref 3.5–5.2)
ALT: 13 U/L (ref 0–35)
AST: 16 U/L (ref 0–37)
Alkaline Phosphatase: 77 U/L (ref 39–117)
Bilirubin, Direct: 0.1 mg/dL (ref 0.0–0.3)
TOTAL PROTEIN: 6.7 g/dL (ref 6.0–8.3)
Total Bilirubin: 0.8 mg/dL (ref 0.2–1.2)

## 2016-09-05 LAB — HEMOGLOBIN A1C: HEMOGLOBIN A1C: 6 % (ref 4.6–6.5)

## 2016-09-05 LAB — TSH: TSH: 2.81 u[IU]/mL (ref 0.35–4.50)

## 2016-09-05 NOTE — Addendum Note (Signed)
Addended by: Felix Ahmadi A on: 09/05/2016 10:03 AM   Modules accepted: Orders

## 2016-09-06 ENCOUNTER — Telehealth: Payer: Self-pay | Admitting: *Deleted

## 2016-09-06 DIAGNOSIS — I671 Cerebral aneurysm, nonruptured: Secondary | ICD-10-CM | POA: Diagnosis not present

## 2016-09-06 DIAGNOSIS — Z9889 Other specified postprocedural states: Secondary | ICD-10-CM | POA: Diagnosis not present

## 2016-09-06 DIAGNOSIS — Z8679 Personal history of other diseases of the circulatory system: Secondary | ICD-10-CM | POA: Diagnosis not present

## 2016-09-06 NOTE — Telephone Encounter (Signed)
Patient requested to have her resent lab results mailed to her.

## 2016-09-06 NOTE — Telephone Encounter (Signed)
Mailed to patient

## 2016-09-12 ENCOUNTER — Telehealth: Payer: Self-pay | Admitting: *Deleted

## 2016-09-12 NOTE — Telephone Encounter (Signed)
Patient notified via the CMA with results, thanks

## 2016-09-12 NOTE — Telephone Encounter (Signed)
Patient requested lab results Pt contact [phone]

## 2016-09-13 ENCOUNTER — Other Ambulatory Visit: Payer: Self-pay | Admitting: Internal Medicine

## 2016-09-26 ENCOUNTER — Other Ambulatory Visit: Payer: Self-pay | Admitting: Internal Medicine

## 2016-09-26 MED ORDER — ACYCLOVIR 400 MG PO TABS: 400.0000 mg | ORAL_TABLET | Freq: Three times a day (TID) | ORAL | 1 refills | Status: DC

## 2016-09-26 NOTE — Telephone Encounter (Signed)
Refilled 08/30/16. Pt last seen 08/30/16. Pt is taking prn when having a outbreak. Please advise?

## 2016-09-26 NOTE — Telephone Encounter (Signed)
ok'd refill for acyclovir #15 with 1 refill.

## 2016-09-26 NOTE — Telephone Encounter (Signed)
PT called requesting a refill on acyclovir (ZOVIRAX) 400 MG tablet. Thank you!  Call pt @ [phone]  Pharmacy [id number] - [place], [place] - [address]

## 2016-10-16 ENCOUNTER — Telehealth: Payer: Self-pay | Admitting: Internal Medicine

## 2016-10-24 ENCOUNTER — Other Ambulatory Visit: Payer: Self-pay

## 2016-10-24 MED ORDER — LOVASTATIN 10 MG PO TABS: 10.0000 mg | ORAL_TABLET | Freq: Every day | ORAL | 5 refills | Status: DC

## 2016-10-24 NOTE — Telephone Encounter (Signed)
Pt called to follow up on refill. Thank you!

## 2016-10-25 ENCOUNTER — Other Ambulatory Visit: Payer: Self-pay | Admitting: Internal Medicine

## 2016-12-05 ENCOUNTER — Encounter: Payer: Self-pay | Admitting: Internal Medicine

## 2016-12-05 ENCOUNTER — Ambulatory Visit (INDEPENDENT_AMBULATORY_CARE_PROVIDER_SITE_OTHER): Payer: Medicare HMO | Admitting: Internal Medicine

## 2016-12-05 ENCOUNTER — Other Ambulatory Visit (HOSPITAL_COMMUNITY)
Admission: RE | Admit: 2016-12-05 | Discharge: 2016-12-05 | Disposition: A | Discharge status: Home / Self Care | Payer: Medicare HMO | Source: Ambulatory Visit | Attending: Internal Medicine | Admitting: Internal Medicine

## 2016-12-05 VITALS — BP 140/90 | HR 73 | Temp 98.5°F | Ht 62.0 in | Wt 114.0 lb

## 2016-12-05 DIAGNOSIS — G453 Amaurosis fugax: Secondary | ICD-10-CM

## 2016-12-05 DIAGNOSIS — A6000 Herpesviral infection of urogenital system, unspecified: Secondary | ICD-10-CM

## 2016-12-05 DIAGNOSIS — N760 Acute vaginitis: Secondary | ICD-10-CM | POA: Diagnosis not present

## 2016-12-05 DIAGNOSIS — E7800 Pure hypercholesterolemia, unspecified: Secondary | ICD-10-CM | POA: Diagnosis not present

## 2016-12-05 DIAGNOSIS — I10 Essential (primary) hypertension: Secondary | ICD-10-CM

## 2016-12-05 DIAGNOSIS — M069 Rheumatoid arthritis, unspecified: Secondary | ICD-10-CM

## 2016-12-05 DIAGNOSIS — R739 Hyperglycemia, unspecified: Secondary | ICD-10-CM

## 2016-12-05 NOTE — Progress Notes (Signed)
Patient ID: MARGERT EDSALL, female   DOB: 10/25/1946, 70 y.o.   MRN: [id number]   Subjective:    Patient ID: Rosana Fret, female    DOB: [birth year], 70 y.o.   MRN: [id number]  HPI  Patient here for a scheduled follow up.  She is doing well.  Off plavix now.  Continues to follow up with vascular surgery.  No headaches.  No dizziness.  Stays active.  No chest pain.  No sob.  No acid reflux.  No abdominal pain or cramping.  Bowels stable.  She was recently diagnosed with herpes.  Treated.  Had a lot of questions about her diagnosis.  Overall she feels she is doing well.     Past Medical History:  Diagnosis Date  . Family history of brain aneurysm   . GERD (gastroesophageal reflux disease)   . Hyperlipidemia   . Hypertension   . MRSA (methicillin resistant Staphylococcus aureus)    skin infections  . Rheumatoid arthritis(714.0)    transaminitis on MTX, remicade rxn, s/p sulfasalazine, orencia  . Ulcer disease    Past Surgical History:  Procedure Laterality Date  . CHOLECYSTECTOMY  2010  . INTRACRANIAL ANEURYSM REPAIR  05/29/2016  . MCP replacements  2001   left  . TUBAL LIGATION     Family History  Problem Relation Age of Onset  . Heart disease Mother   . Diabetes Mother   . Hypercholesterolemia Mother   . Heart disease Father   . Heart disease Brother     MI age 35  . Hypercholesterolemia Sister   . Colon polyps Sister   . Arthritis/Rheumatoid Paternal Aunt   . Arthritis/Rheumatoid Paternal Uncle   . Breast cancer Neg Hx    Social History   Social History  . Marital status: Widowed    Spouse name: N/A  . Number of children: 1  . Years of education: N/A   Social History Main Topics  . Smoking status: Former Smoker    Quit date: 12/30/1988  . Smokeless tobacco: Never Used  . Alcohol use No  . Drug use: No  . Sexual activity: Not Asked   Other Topics Concern  . None   Social History Narrative  . None    Outpatient Encounter Prescriptions as of 12/05/2016    Medication Sig  . abatacept (ORENCIA) 250 MG injection Once every month  . acyclovir (ZOVIRAX) 400 MG tablet TAKE 1 TABLET BY MOUTH 3 TIMES A DAY  . albuterol (PROVENTIL HFA;VENTOLIN HFA) 108 (90 BASE) MCG/ACT inhaler Inhale 2 puffs into the lungs every 6 (six) hours as needed for wheezing or shortness of breath.  Marland Kitchen amLODipine (NORVASC) 2.5 MG tablet TAKE 1 TABLET (2.5 MG TOTAL) BY MOUTH DAILY.  Marland Kitchen aspirin 81 MG chewable tablet Chew 81 mg by mouth daily.  . calcium gluconate 650 MG tablet Take 650 mg by mouth daily.  . cholecalciferol (VITAMIN D) 1000 UNITS tablet Take 1,000 Units by mouth daily.  . clopidogrel (PLAVIX) 75 MG tablet Take 75 mg by mouth daily.  . CRESTOR 5 MG tablet TAKE 1 TABLET (5 MG TOTAL) BY MOUTH DAILY.  . dabigatran (PRADAXA) 75 MG CAPS capsule Take 75 mg by mouth 2 (two) times daily.  . fluticasone (FLONASE) 50 MCG/ACT nasal spray Place 2 sprays into the nose daily as needed.  Marland Kitchen losartan (COZAAR) 100 MG tablet TAKE 1 TABLET (100 MG TOTAL) BY MOUTH DAILY.  Marland Kitchen lovastatin (MEVACOR) 10 MG tablet Take 1 tablet (10 mg total) by  mouth at bedtime.  . magnesium oxide (MAG-OX) 400 MG tablet Take 400 mg by mouth daily.  . methotrexate (RHEUMATREX) 2.5 MG tablet Take 2.5 mg by mouth once a week.   . valACYclovir (VALTREX) 1000 MG tablet Take 1 tablet (1,000 mg total) by mouth 2 (two) times daily.  . ranitidine (ZANTAC) 150 MG capsule Take 150 mg by mouth daily.   No facility-administered encounter medications on file as of 12/05/2016.     Review of Systems  Constitutional: Negative for appetite change and unexpected weight change.  HENT: Negative for congestion and sinus pressure.   Respiratory: Negative for cough, chest tightness and shortness of breath.   Cardiovascular: Negative for chest pain, palpitations and leg swelling.  Gastrointestinal: Negative for abdominal pain, diarrhea, nausea and vomiting.  Genitourinary: Negative for difficulty urinating, dysuria, vaginal  discharge and vaginal pain.  Musculoskeletal:       Has RA.  Sees rheumatology.  Stable.    Skin: Negative for color change and rash.  Neurological: Negative for dizziness, light-headedness and headaches.  Psychiatric/Behavioral: Negative for agitation and dysphoric mood.       Objective:    Physical Exam  Constitutional: She appears well-developed and well-nourished. No distress.  HENT:  Nose: Nose normal.  Mouth/Throat: Oropharynx is clear and moist.  Neck: Neck supple. No thyromegaly present.  Cardiovascular: Normal rate and regular rhythm.   Pulmonary/Chest: Breath sounds normal. No respiratory distress. She has no wheezes.  Abdominal: Soft. Bowel sounds are normal. There is no tenderness.  Genitourinary:  Genitourinary Comments: Normal external genitalia.  No lesions noted.  Vaginal vault without lesions.  Could not appreciate any adnexal masses or tenderness.    Musculoskeletal: She exhibits no edema or tenderness.  Lymphadenopathy:    She has no cervical adenopathy.  Skin: No rash noted. No erythema.  Psychiatric: She has a normal mood and affect. Her behavior is normal.    BP 140/90   Pulse 73   Temp 98.5 F (36.9 C) (Oral)   Ht 5\' 2"  (1.575 m)   Wt 114 lb (51.7 kg)   SpO2 98%   BMI 20.85 kg/m  Wt Readings from Last 3 Encounters:  12/05/16 114 lb (51.7 kg)  08/30/16 114 lb (51.7 kg)  08/19/16 112 lb 2 oz (50.9 kg)     Lab Results  Component Value Date   WBC 7.5 09/05/2016   HGB 13.4 09/05/2016   HCT 39.7 09/05/2016   PLT 235.0 09/05/2016   GLUCOSE 87 09/05/2016   CHOL 227 (H) 09/05/2016   TRIG 172.0 (H) 09/05/2016   HDL 37.90 (L) 09/05/2016   LDLDIRECT 167.8 12/20/2013   LDLCALC 155 (H) 09/05/2016   ALT 13 09/05/2016   AST 16 09/05/2016   NA 132 (L) 09/05/2016   K 3.9 09/05/2016   CL 100 09/05/2016   CREATININE 0.92 09/05/2016   BUN 11 09/05/2016   CO2 26 09/05/2016   TSH 2.81 09/05/2016   INR 1.1 (H) 12/06/2013   HGBA1C 6.0 09/05/2016        Assessment & Plan:   Problem List Items Addressed This Visit    Amaurosis fugax    Is s/p PED of right ICA for cavernous and supraclinoid aneurysms.  Off plavix.  Taking aspirin.  Doing well.  Follow.        Genital herpes    S/p treatment with acyclovir.  No pain or symptoms now.  Had a lot of questions about her diagnosis.  No outbreak now.  Pelvic exam  performed today.  Pt reassured.        Hypercholesteremia    On lovastatin.  Low cholesterol diet and exercise.  Follow lipid panel and liver function tests.        Relevant Orders   Lipid panel   Hepatic function panel   Hypertension    Blood pressure on recheck improved.  Continue same medication regimen.  Follow pressures.  Follow metabolic panel.       Relevant Orders   Basic metabolic panel   Rheumatoid arthritis (HCC)    Followed by Dr Gavin Potters.  Stable.         Other Visit Diagnoses    Vaginitis and vulvovaginitis    -  Primary   Relevant Orders   Cervicovaginal ancillary only (Completed)   Hyperglycemia       Relevant Orders   Hemoglobin A1c       Dale Plush, MD

## 2016-12-05 NOTE — Progress Notes (Signed)
Pre visit review using our clinic review tool, if applicable. No additional management support is needed unless otherwise documented below in the visit note. 

## 2016-12-06 LAB — CERVICOVAGINAL ANCILLARY ONLY: Wet Prep (BD Affirm): NEGATIVE

## 2016-12-07 ENCOUNTER — Encounter: Payer: Self-pay | Admitting: Internal Medicine

## 2016-12-07 NOTE — Assessment & Plan Note (Signed)
Blood pressure on recheck improved.  Continue same medication regimen.  Follow pressures.  Follow metabolic panel.   

## 2016-12-07 NOTE — Assessment & Plan Note (Signed)
Is s/p PED of right ICA for cavernous and supraclinoid aneurysms.  Off plavix.  Taking aspirin.  Doing well.  Follow.

## 2016-12-07 NOTE — Assessment & Plan Note (Addendum)
S/p treatment with acyclovir.  No pain or symptoms now.  Had a lot of questions about her diagnosis.  No outbreak now.  Pelvic exam performed today.  Pt reassured.

## 2016-12-07 NOTE — Assessment & Plan Note (Signed)
Followed by Dr Kernodle.  Stable.  

## 2016-12-07 NOTE — Assessment & Plan Note (Signed)
On lovastatin.  Low cholesterol diet and exercise.  Follow lipid panel and liver function tests.   

## 2016-12-10 DIAGNOSIS — Z79899 Other long term (current) drug therapy: Secondary | ICD-10-CM | POA: Diagnosis not present

## 2016-12-10 DIAGNOSIS — M0579 Rheumatoid arthritis with rheumatoid factor of multiple sites without organ or systems involvement: Secondary | ICD-10-CM | POA: Diagnosis not present

## 2017-01-02 ENCOUNTER — Other Ambulatory Visit: Payer: Self-pay | Admitting: Internal Medicine

## 2017-01-07 DIAGNOSIS — M0579 Rheumatoid arthritis with rheumatoid factor of multiple sites without organ or systems involvement: Secondary | ICD-10-CM | POA: Diagnosis not present

## 2017-02-04 DIAGNOSIS — M0579 Rheumatoid arthritis with rheumatoid factor of multiple sites without organ or systems involvement: Secondary | ICD-10-CM | POA: Diagnosis not present

## 2017-02-06 ENCOUNTER — Other Ambulatory Visit (INDEPENDENT_AMBULATORY_CARE_PROVIDER_SITE_OTHER): Payer: Medicare HMO

## 2017-02-06 DIAGNOSIS — E7800 Pure hypercholesterolemia, unspecified: Secondary | ICD-10-CM

## 2017-02-06 DIAGNOSIS — I10 Essential (primary) hypertension: Secondary | ICD-10-CM | POA: Diagnosis not present

## 2017-02-06 DIAGNOSIS — R739 Hyperglycemia, unspecified: Secondary | ICD-10-CM | POA: Diagnosis not present

## 2017-02-06 LAB — LIPID PANEL
CHOLESTEROL: 192 mg/dL (ref 0–200)
HDL: 46.7 mg/dL (ref >39.00)
LDL CALC: 122 mg/dL — AB (ref 0–99)
NonHDL: 145.36
TRIGLYCERIDES: 115 mg/dL (ref 0.0–149.0)
Total CHOL/HDL Ratio: 4
VLDL: 23 mg/dL (ref 0.0–40.0)

## 2017-02-06 LAB — BASIC METABOLIC PANEL
BUN: 13 mg/dL (ref 6–23)
CALCIUM: 9.1 mg/dL (ref 8.4–10.5)
CO2: 26 mEq/L (ref 19–32)
Chloride: 99 mEq/L (ref 96–112)
Creatinine, Ser: 1.01 mg/dL (ref 0.40–1.20)
GFR: 57.24 mL/min — AB (ref >60.00)
GLUCOSE: 82 mg/dL (ref 70–99)
Potassium: 4.3 mEq/L (ref 3.5–5.1)
SODIUM: 131 meq/L — AB (ref 135–145)

## 2017-02-06 LAB — HEPATIC FUNCTION PANEL
ALBUMIN: 4.1 g/dL (ref 3.5–5.2)
ALT: 22 U/L (ref 0–35)
AST: 27 U/L (ref 0–37)
Alkaline Phosphatase: 76 U/L (ref 39–117)
Bilirubin, Direct: 0.1 mg/dL (ref 0.0–0.3)
TOTAL PROTEIN: 6.8 g/dL (ref 6.0–8.3)
Total Bilirubin: 0.6 mg/dL (ref 0.2–1.2)

## 2017-02-06 LAB — HEMOGLOBIN A1C: HEMOGLOBIN A1C: 5.9 % (ref 4.6–6.5)

## 2017-02-10 ENCOUNTER — Ambulatory Visit: Payer: Medicare HMO | Admitting: Internal Medicine

## 2017-02-24 ENCOUNTER — Ambulatory Visit (INDEPENDENT_AMBULATORY_CARE_PROVIDER_SITE_OTHER): Payer: Medicare HMO | Admitting: Internal Medicine

## 2017-02-24 ENCOUNTER — Encounter: Payer: Self-pay | Admitting: Internal Medicine

## 2017-02-24 VITALS — BP 136/78 | HR 62 | Temp 98.5°F | Resp 16 | Ht 62.0 in | Wt 118.4 lb

## 2017-02-24 DIAGNOSIS — R739 Hyperglycemia, unspecified: Secondary | ICD-10-CM

## 2017-02-24 DIAGNOSIS — M069 Rheumatoid arthritis, unspecified: Secondary | ICD-10-CM

## 2017-02-24 DIAGNOSIS — E7800 Pure hypercholesterolemia, unspecified: Secondary | ICD-10-CM

## 2017-02-24 DIAGNOSIS — G453 Amaurosis fugax: Secondary | ICD-10-CM

## 2017-02-24 DIAGNOSIS — I10 Essential (primary) hypertension: Secondary | ICD-10-CM | POA: Diagnosis not present

## 2017-02-24 NOTE — Progress Notes (Signed)
Patient ID: Erica Little, female   DOB: [birth year], 71 y.o.   MRN: [id number]   Subjective:    Patient ID: Erica Little, female    DOB: 1946-12-22, 71 y.o.   MRN: [id number]  HPI  Patient here for a scheduled follow up.  States she is doing better.  Feels better.  Stress is better.  Stays active.  Eating.  No chest pain.  No sob.  No acid reflux.  No light headedness.  No dizziness.  No acid reflux.  No abdominal pain or cramping.  Bowels stable.  Due f/u MRI 5-05/2017.  Discussed labs.  Taking her cholesterol medication 3- 4 days per week.     Past Medical History:  Diagnosis Date  . Family history of brain aneurysm   . GERD (gastroesophageal reflux disease)   . Hyperlipidemia   . Hypertension   . MRSA (methicillin resistant Staphylococcus aureus)    skin infections  . Rheumatoid arthritis(714.0)    transaminitis on MTX, remicade rxn, s/p sulfasalazine, orencia  . Ulcer disease    Past Surgical History:  Procedure Laterality Date  . CHOLECYSTECTOMY  2010  . INTRACRANIAL ANEURYSM REPAIR  05/29/2016  . MCP replacements  2001   left  . TUBAL LIGATION     Family History  Problem Relation Age of Onset  . Heart disease Mother   . Diabetes Mother   . Hypercholesterolemia Mother   . Heart disease Father   . Heart disease Brother     MI age 16  . Hypercholesterolemia Sister   . Colon polyps Sister   . Arthritis/Rheumatoid Paternal Aunt   . Arthritis/Rheumatoid Paternal Uncle   . Breast cancer Neg Hx    Social History   Social History  . Marital status: Widowed    Spouse name: N/A  . Number of children: 1  . Years of education: N/A   Social History Main Topics  . Smoking status: Former Smoker    Quit date: 12/30/1988  . Smokeless tobacco: Never Used  . Alcohol use No  . Drug use: No  . Sexual activity: Not Asked   Other Topics Concern  . None   Social History Narrative  . None    Outpatient Encounter Prescriptions as of 02/24/2017  Medication Sig  . abatacept  (ORENCIA) 250 MG injection Once every month  . albuterol (PROVENTIL HFA;VENTOLIN HFA) 108 (90 BASE) MCG/ACT inhaler Inhale 2 puffs into the lungs every 6 (six) hours as needed for wheezing or shortness of breath.  Marland Kitchen amLODipine (NORVASC) 2.5 MG tablet TAKE 1 TABLET (2.5 MG TOTAL) BY MOUTH DAILY.  Marland Kitchen aspirin 81 MG chewable tablet Chew 81 mg by mouth daily.  . calcium gluconate 650 MG tablet Take 650 mg by mouth daily.  . cholecalciferol (VITAMIN D) 1000 UNITS tablet Take 1,000 Units by mouth daily.  . clopidogrel (PLAVIX) 75 MG tablet Take 75 mg by mouth daily.  . dabigatran (PRADAXA) 75 MG CAPS capsule Take 75 mg by mouth 2 (two) times daily.  . fluticasone (FLONASE) 50 MCG/ACT nasal spray Place 2 sprays into the nose daily as needed.  . lovastatin (MEVACOR) 10 MG tablet Take 1 tablet (10 mg total) by mouth at bedtime.  . magnesium oxide (MAG-OX) 400 MG tablet Take 400 mg by mouth daily.  . methotrexate (RHEUMATREX) 2.5 MG tablet Take 2.5 mg by mouth once a week.   . valACYclovir (VALTREX) 1000 MG tablet Take 1 tablet (1,000 mg total) by mouth 2 (two) times daily.  . [  DISCONTINUED] CRESTOR 5 MG tablet TAKE 1 TABLET (5 MG TOTAL) BY MOUTH DAILY.  . [DISCONTINUED] acyclovir (ZOVIRAX) 400 MG tablet TAKE 1 TABLET BY MOUTH 3 TIMES A DAY  . [DISCONTINUED] losartan (COZAAR) 100 MG tablet TAKE 1 TABLET (100 MG TOTAL) BY MOUTH DAILY.  . [DISCONTINUED] ranitidine (ZANTAC) 150 MG capsule Take 150 mg by mouth daily.   No facility-administered encounter medications on file as of 02/24/2017.     Review of Systems  Constitutional: Negative for appetite change and unexpected weight change.  HENT: Negative for congestion and sinus pressure.   Respiratory: Negative for cough, chest tightness and shortness of breath.   Cardiovascular: Negative for chest pain, palpitations and leg swelling.  Gastrointestinal: Negative for abdominal pain, diarrhea, nausea and vomiting.  Genitourinary: Negative for difficulty  urinating and dysuria.  Musculoskeletal: Negative for back pain and myalgias.       Joints stable.   Skin: Negative for color change and rash.  Neurological: Negative for dizziness, light-headedness and headaches.  Psychiatric/Behavioral: Negative for agitation and dysphoric mood.       Objective:    Physical Exam  Constitutional: She appears well-developed and well-nourished. No distress.  HENT:  Nose: Nose normal.  Mouth/Throat: Oropharynx is clear and moist.  Neck: Neck supple. No thyromegaly present.  Cardiovascular: Normal rate and regular rhythm.   Pulmonary/Chest: Breath sounds normal. No respiratory distress. She has no wheezes.  Abdominal: Soft. Bowel sounds are normal. There is no tenderness.  Musculoskeletal: She exhibits no edema or tenderness.  Lymphadenopathy:    She has no cervical adenopathy.  Skin: No rash noted. No erythema.  Psychiatric: She has a normal mood and affect. Her behavior is normal.    BP 136/78 (BP Location: Left Arm, Patient Position: Sitting, Cuff Size: Large)   Pulse 62   Temp 98.5 F (36.9 C) (Oral)   Resp 16   Ht 5\' 2"  (1.575 m)   Wt 118 lb 6.4 oz (53.7 kg)   SpO2 94%   BMI 21.66 kg/m  Wt Readings from Last 3 Encounters:  02/24/17 118 lb 6.4 oz (53.7 kg)  12/05/16 114 lb (51.7 kg)  08/30/16 114 lb (51.7 kg)     Lab Results  Component Value Date   WBC 7.5 09/05/2016   HGB 13.4 09/05/2016   HCT 39.7 09/05/2016   PLT 235.0 09/05/2016   GLUCOSE 82 02/06/2017   CHOL 192 02/06/2017   TRIG 115.0 02/06/2017   HDL 46.70 02/06/2017   LDLDIRECT 167.8 12/20/2013   LDLCALC 122 (H) 02/06/2017   ALT 22 02/06/2017   AST 27 02/06/2017   NA 131 (L) 02/06/2017   K 4.3 02/06/2017   CL 99 02/06/2017   CREATININE 1.01 02/06/2017   BUN 13 02/06/2017   CO2 26 02/06/2017   TSH 2.81 09/05/2016   INR 1.1 (H) 12/06/2013   HGBA1C 5.9 02/06/2017       Assessment & Plan:   Problem List Items Addressed This Visit    Amaurosis fugax    Is  s/p PED of right ICA for cavernous and supraclinoid aneurysms.  Taking aspirin and doing well.  Has f/u planned in 5-05/2017.        Hypercholesteremia    On lovastatin.  Will continue at her current dose.  Feels she is tolerating this dose ok.  Unable to increase.  Follow.        Relevant Orders   Hepatic function panel   Lipid panel   Hypertension    Blood  pressure under good control.  Continue same medication regimen.  Follow pressures.  Follow metabolic panel.        Relevant Orders   Basic metabolic panel   Rheumatoid arthritis (HCC)    Followed by Dr Gavin Potters.  Stable.        Other Visit Diagnoses    Hyperglycemia    -  Primary   Relevant Orders   Hemoglobin A1c       Dale Poteet, MD

## 2017-02-24 NOTE — Progress Notes (Signed)
Pre-visit discussion using our clinic review tool. No additional management support is needed unless otherwise documented below in the visit note.  

## 2017-03-04 DIAGNOSIS — M0579 Rheumatoid arthritis with rheumatoid factor of multiple sites without organ or systems involvement: Secondary | ICD-10-CM | POA: Diagnosis not present

## 2017-03-08 ENCOUNTER — Encounter: Payer: Self-pay | Admitting: Internal Medicine

## 2017-03-08 NOTE — Assessment & Plan Note (Signed)
Blood pressure under good control.  Continue same medication regimen.  Follow pressures.  Follow metabolic panel.   

## 2017-03-08 NOTE — Assessment & Plan Note (Signed)
On lovastatin.  Will continue at her current dose.  Feels she is tolerating this dose ok.  Unable to increase.  Follow.

## 2017-03-08 NOTE — Assessment & Plan Note (Signed)
Followed by Dr Kernodle.  Stable.  

## 2017-03-08 NOTE — Assessment & Plan Note (Signed)
Is s/p PED of right ICA for cavernous and supraclinoid aneurysms.  Taking aspirin and doing well.  Has f/u planned in 5-05/2017.

## 2017-03-26 ENCOUNTER — Other Ambulatory Visit: Payer: Self-pay

## 2017-03-26 MED ORDER — LOVASTATIN 10 MG PO TABS: 10.0000 mg | ORAL_TABLET | Freq: Every day | ORAL | 5 refills | Status: DC

## 2017-04-07 DIAGNOSIS — Z79899 Other long term (current) drug therapy: Secondary | ICD-10-CM | POA: Diagnosis not present

## 2017-04-07 DIAGNOSIS — M0579 Rheumatoid arthritis with rheumatoid factor of multiple sites without organ or systems involvement: Secondary | ICD-10-CM | POA: Diagnosis not present

## 2017-04-08 DIAGNOSIS — M0579 Rheumatoid arthritis with rheumatoid factor of multiple sites without organ or systems involvement: Secondary | ICD-10-CM | POA: Diagnosis not present

## 2017-04-08 DIAGNOSIS — Z79899 Other long term (current) drug therapy: Secondary | ICD-10-CM | POA: Diagnosis not present

## 2017-04-21 ENCOUNTER — Ambulatory Visit (INDEPENDENT_AMBULATORY_CARE_PROVIDER_SITE_OTHER): Payer: Medicare HMO | Admitting: Family

## 2017-04-21 ENCOUNTER — Encounter: Payer: Self-pay | Admitting: Family

## 2017-04-21 VITALS — BP 122/60 | HR 70 | Temp 98.6°F | Resp 14 | Wt 114.4 lb

## 2017-04-21 DIAGNOSIS — J40 Bronchitis, not specified as acute or chronic: Secondary | ICD-10-CM | POA: Diagnosis not present

## 2017-04-21 MED ORDER — CEFDINIR 300 MG PO CAPS: 300.0000 mg | ORAL_CAPSULE | Freq: Two times a day (BID) | ORAL | 0 refills | Status: DC

## 2017-04-21 MED ORDER — BENZONATATE 100 MG PO CAPS: 100.0000 mg | ORAL_CAPSULE | Freq: Two times a day (BID) | ORAL | 0 refills | Status: DC | PRN

## 2017-04-21 NOTE — Patient Instructions (Signed)
Probiotic during and 2 weeks after antibiotic.   Increase intake of clear fluids. Congestion is best treated by hydration, when mucus is wetter, it is thinner, less sticky, and easier to expel from the body, either through coughing up drainage, or by blowing your nose.   Get plenty of rest.   Use saline nasal drops and blow your nose frequently. Run a humidifier at night and elevate the head of the bed. Vicks Vapor rub will help with congestion and cough. Steam showers and sinus massage for congestion.   Use Acetaminophen or Ibuprofen as needed for fever or pain. Avoid second hand smoke. Even the smallest exposure will worsen symptoms.   Over the counter medications you can try include Delsym for cough, a decongestant for congestion, and Mucinex or Robitussin as an expectorant. Be sure to just get the plain Mucinex or Robitussin that just has one medication (Guaifenesen). We don't recommend the combination products. Note, be sure to drink two glasses of water with each dose of Mucinex as the medication will not work well without adequate hydration.   You can also try a teaspoon of honey to see if this will help reduce cough. Throat lozenges can sometimes be beneficial as well.    This illness will typically last 7 - 10 days.   Please follow up with our clinic if you develop a fever greater than 101 F, symptoms worsen, or do not resolve in the next week.

## 2017-04-21 NOTE — Progress Notes (Signed)
Pre-visit discussion using our clinic review tool. No additional management support is needed unless otherwise documented below in the visit note.  

## 2017-04-21 NOTE — Progress Notes (Signed)
Subjective:    Patient ID: Erica Little, female    DOB: March 09, 1946, 71 y.o.   MRN: [id number]  CC: Erica Little is a 71 y.o. female who presents today for an acute visit.    HPI: CC: productive cough x 6 days, worsening. Endorses nasal congestion.   OTC didn't help, tried mucinex. Congestion has changed to yellow.  Hasn't needed albuterol inhaler. No wheezing, SOB,  Sinus pain, ear pain, chest pain, vision changes.   Former smoker        HISTORY:  Past Medical History:  Diagnosis Date  . Family history of brain aneurysm   . GERD (gastroesophageal reflux disease)   . Hyperlipidemia   . Hypertension   . MRSA (methicillin resistant Staphylococcus aureus)    skin infections  . Rheumatoid arthritis(714.0)    transaminitis on MTX, remicade rxn, s/p sulfasalazine, orencia  . Ulcer disease    Past Surgical History:  Procedure Laterality Date  . CHOLECYSTECTOMY  2010  . INTRACRANIAL ANEURYSM REPAIR  05/29/2016  . MCP replacements  2001   left  . TUBAL LIGATION     Family History  Problem Relation Age of Onset  . Heart disease Mother   . Diabetes Mother   . Hypercholesterolemia Mother   . Heart disease Father   . Heart disease Brother     MI age 40  . Hypercholesterolemia Sister   . Colon polyps Sister   . Arthritis/Rheumatoid Paternal Aunt   . Arthritis/Rheumatoid Paternal Uncle   . Breast cancer Neg Hx     Allergies: Remicade [infliximab]; Simvastatin; Sulfa antibiotics; Zetia [ezetimibe]; and Latex Current Outpatient Prescriptions on File Prior to Visit  Medication Sig Dispense Refill  . abatacept (ORENCIA) 250 MG injection Once every month    . albuterol (PROVENTIL HFA;VENTOLIN HFA) 108 (90 BASE) MCG/ACT inhaler Inhale 2 puffs into the lungs every 6 (six) hours as needed for wheezing or shortness of breath. 1 Inhaler 2  . amLODipine (NORVASC) 2.5 MG tablet TAKE 1 TABLET (2.5 MG TOTAL) BY MOUTH DAILY. 90 tablet 1  . aspirin 81 MG chewable tablet Chew 81 mg  by mouth daily.    . calcium gluconate 650 MG tablet Take 650 mg by mouth daily.    . cholecalciferol (VITAMIN D) 1000 UNITS tablet Take 1,000 Units by mouth daily.    . clopidogrel (PLAVIX) 75 MG tablet Take 75 mg by mouth daily.    . dabigatran (PRADAXA) 75 MG CAPS capsule Take 75 mg by mouth 2 (two) times daily.    . fluticasone (FLONASE) 50 MCG/ACT nasal spray Place 2 sprays into the nose daily as needed.    . lovastatin (MEVACOR) 10 MG tablet Take 1 tablet (10 mg total) by mouth at bedtime. 30 tablet 5  . magnesium oxide (MAG-OX) 400 MG tablet Take 400 mg by mouth daily.    . methotrexate (RHEUMATREX) 2.5 MG tablet Take 2.5 mg by mouth once a week.     . valACYclovir (VALTREX) 1000 MG tablet Take 1 tablet (1,000 mg total) by mouth 2 (two) times daily. 20 tablet 0   No current facility-administered medications on file prior to visit.     Social History  Substance Use Topics  . Smoking status: Former Smoker    Quit date: 12/30/1988  . Smokeless tobacco: Never Used  . Alcohol use No    Review of Systems  Constitutional: Negative for chills and fever.  HENT: Positive for congestion. Negative for sinus pain, sinus pressure and  sore throat.   Eyes: Negative for visual disturbance.  Respiratory: Positive for cough. Negative for shortness of breath and wheezing.   Cardiovascular: Negative for chest pain and palpitations.  Gastrointestinal: Negative for nausea and vomiting.  Neurological: Negative for headaches.      Objective:    BP 122/60 (BP Location: Left Arm, Patient Position: Sitting, Cuff Size: Normal)   Pulse 70   Temp 98.6 F (37 C) (Oral)   Resp 14   Wt 114 lb 6.4 oz (51.9 kg)   SpO2 97%   BMI 20.92 kg/m    Physical Exam  Constitutional: She appears well-developed and well-nourished.  HENT:  Head: Normocephalic and atraumatic.  Right Ear: Hearing, tympanic membrane, external ear and ear canal normal. No drainage, swelling or tenderness. No foreign bodies. Tympanic  membrane is not erythematous and not bulging. No middle ear effusion. No decreased hearing is noted.  Left Ear: Hearing, tympanic membrane, external ear and ear canal normal. No drainage, swelling or tenderness. No foreign bodies. Tympanic membrane is not erythematous and not bulging.  No middle ear effusion. No decreased hearing is noted.  Nose: Nose normal. No rhinorrhea. Right sinus exhibits no maxillary sinus tenderness and no frontal sinus tenderness. Left sinus exhibits no maxillary sinus tenderness and no frontal sinus tenderness.  Mouth/Throat: Uvula is midline, oropharynx is clear and moist and mucous membranes are normal. No oropharyngeal exudate, posterior oropharyngeal edema, posterior oropharyngeal erythema or tonsillar abscesses.  Eyes: Conjunctivae are normal.  Cardiovascular: Regular rhythm, normal heart sounds and normal pulses.   Pulmonary/Chest: Effort normal and breath sounds normal. She has no wheezes. She has no rhonchi. She has no rales.  Lymphadenopathy:       Head (right side): No submental, no submandibular, no tonsillar, no preauricular, no posterior auricular and no occipital adenopathy present.       Head (left side): No submental, no submandibular, no tonsillar, no preauricular, no posterior auricular and no occipital adenopathy present.    She has no cervical adenopathy.  Neurological: She is alert.  Skin: Skin is warm and dry.  Psychiatric: She has a normal mood and affect. Her speech is normal and behavior is normal. Thought content normal.  Vitals reviewed.      Assessment & Plan:   1. Bronchitis No Acute distress. [id number]. Due to worsening symptoms, we jointly agreed to start an antibiotic. Return precautions given.    - cefdinir (OMNICEF) 300 MG capsule; Take 1 capsule (300 mg total) by mouth 2 (two) times daily.  Dispense: 20 capsule; Refill: 0 - benzonatate (TESSALON) 100 MG capsule; Take 1 capsule (100 mg total) by mouth 2 (two) times daily as needed  for cough.  Dispense: 20 capsule; Refill: 0    I am having [name] maintain her abatacept, calcium gluconate, cholecalciferol, fluticasone, methotrexate, magnesium oxide, albuterol, aspirin, dabigatran, valACYclovir, clopidogrel, amLODipine, and lovastatin.   No orders of the defined types were placed in this encounter.   Return precautions given.   Risks, benefits, and alternatives of the medications and treatment plan prescribed today were discussed, and patient expressed understanding.   Education regarding symptom management and diagnosis given to patient on AVS.  Continue to follow with Dale Mission, MD for routine health maintenance.   Erica Little and I agreed with plan.   Rennie Plowman, FNP

## 2017-04-25 DIAGNOSIS — K1379 Other lesions of oral mucosa: Secondary | ICD-10-CM | POA: Diagnosis not present

## 2017-05-13 DIAGNOSIS — M0579 Rheumatoid arthritis with rheumatoid factor of multiple sites without organ or systems involvement: Secondary | ICD-10-CM | POA: Diagnosis not present

## 2017-05-16 ENCOUNTER — Other Ambulatory Visit: Payer: Self-pay | Admitting: Internal Medicine

## 2017-05-16 DIAGNOSIS — Z1231 Encounter for screening mammogram for malignant neoplasm of breast: Secondary | ICD-10-CM

## 2017-05-21 DIAGNOSIS — H2513 Age-related nuclear cataract, bilateral: Secondary | ICD-10-CM | POA: Diagnosis not present

## 2017-05-28 ENCOUNTER — Ambulatory Visit
Admission: RE | Admit: 2017-05-28 | Discharge: 2017-05-28 | Disposition: A | Discharge status: Home / Self Care | Payer: Medicare HMO | Source: Ambulatory Visit | Attending: Internal Medicine | Admitting: Internal Medicine

## 2017-05-28 DIAGNOSIS — Z1231 Encounter for screening mammogram for malignant neoplasm of breast: Secondary | ICD-10-CM | POA: Diagnosis not present

## 2017-06-05 DIAGNOSIS — I671 Cerebral aneurysm, nonruptured: Secondary | ICD-10-CM | POA: Diagnosis not present

## 2017-06-08 ENCOUNTER — Other Ambulatory Visit: Payer: Self-pay | Admitting: Internal Medicine

## 2017-06-10 DIAGNOSIS — M0579 Rheumatoid arthritis with rheumatoid factor of multiple sites without organ or systems involvement: Secondary | ICD-10-CM | POA: Diagnosis not present

## 2017-06-30 ENCOUNTER — Other Ambulatory Visit: Payer: Medicare HMO

## 2017-07-01 ENCOUNTER — Ambulatory Visit (INDEPENDENT_AMBULATORY_CARE_PROVIDER_SITE_OTHER): Payer: Medicare HMO | Admitting: Internal Medicine

## 2017-07-01 ENCOUNTER — Encounter: Payer: Self-pay | Admitting: Internal Medicine

## 2017-07-01 VITALS — BP 140/88 | HR 70 | Temp 98.1°F | Resp 12 | Ht 62.0 in | Wt 112.2 lb

## 2017-07-01 DIAGNOSIS — M069 Rheumatoid arthritis, unspecified: Secondary | ICD-10-CM | POA: Diagnosis not present

## 2017-07-01 DIAGNOSIS — Z23 Encounter for immunization: Secondary | ICD-10-CM | POA: Diagnosis not present

## 2017-07-01 DIAGNOSIS — E2839 Other primary ovarian failure: Secondary | ICD-10-CM

## 2017-07-01 DIAGNOSIS — R634 Abnormal weight loss: Secondary | ICD-10-CM | POA: Diagnosis not present

## 2017-07-01 DIAGNOSIS — Z0001 Encounter for general adult medical examination with abnormal findings: Secondary | ICD-10-CM

## 2017-07-01 DIAGNOSIS — E7800 Pure hypercholesterolemia, unspecified: Secondary | ICD-10-CM | POA: Diagnosis not present

## 2017-07-01 DIAGNOSIS — Z9889 Other specified postprocedural states: Secondary | ICD-10-CM

## 2017-07-01 DIAGNOSIS — Z8679 Personal history of other diseases of the circulatory system: Secondary | ICD-10-CM

## 2017-07-01 DIAGNOSIS — Z0000 Encounter for general adult medical examination without abnormal findings: Secondary | ICD-10-CM

## 2017-07-01 DIAGNOSIS — I10 Essential (primary) hypertension: Secondary | ICD-10-CM | POA: Diagnosis not present

## 2017-07-01 DIAGNOSIS — R490 Dysphonia: Secondary | ICD-10-CM | POA: Diagnosis not present

## 2017-07-01 DIAGNOSIS — M810 Age-related osteoporosis without current pathological fracture: Secondary | ICD-10-CM | POA: Diagnosis not present

## 2017-07-01 DIAGNOSIS — R739 Hyperglycemia, unspecified: Secondary | ICD-10-CM

## 2017-07-01 NOTE — Patient Instructions (Signed)
Zantac (ranitidine) 150mg  take one tablet 30 minutes before breakfast and one tablet 30 minutes before evening meal.

## 2017-07-01 NOTE — Progress Notes (Signed)
Pre-visit discussion using our clinic review tool. No additional management support is needed unless otherwise documented below in the visit note.  

## 2017-07-01 NOTE — Assessment & Plan Note (Addendum)
Physical today 07/01/17.  PAP 04/22/14 - negative with negative HPV.  Mammogram 05/28/17 -  Birads I.  Colonoscopy 08/2014.  Recommended f/u in 08/2019.

## 2017-07-01 NOTE — Progress Notes (Signed)
Patient ID: COLLEENA KURTENBACH, female   DOB: [birth year], 71 y.o.   MRN: [id number]   Subjective:    Patient ID: Rosana Fret, female    DOB: [birth year], 71 y.o.   MRN: [id number]  HPI  Patient here for her physical exam.  Has RA.  Sees Dr Gavin Potters.  On orencia and MTX.  She has been having persistent hoarseness.  Present now for 2 months.  She reports no significant cough or congestion.  She does report she started taking zantac and may have helped some.  No headache.  No dizziness.  Recently evaluated at [facility].  MRA ok.  Off plavix now.  Recommended to remain on aspirin lifelong.  Recommended repeat in one year.  No chest pain.  No sob.  No abdominal pain.  Bowels moving.  Takes stool softener to control occasional constipation.  Weight down some.  States she is eating.     Past Medical History:  Diagnosis Date  . Family history of brain aneurysm   . GERD (gastroesophageal reflux disease)   . Hyperlipidemia   . Hypertension   . MRSA (methicillin resistant Staphylococcus aureus)    skin infections  . Rheumatoid arthritis(714.0)    transaminitis on MTX, remicade rxn, s/p sulfasalazine, orencia  . Ulcer disease    Past Surgical History:  Procedure Laterality Date  . CHOLECYSTECTOMY  2010  . INTRACRANIAL ANEURYSM REPAIR  05/29/2016  . MCP replacements  2001   left  . TUBAL LIGATION     Family History  Problem Relation Age of Onset  . Heart disease Mother   . Diabetes Mother   . Hypercholesterolemia Mother   . Heart disease Father   . Heart disease Brother        MI age 36  . Hypercholesterolemia Sister   . Colon polyps Sister   . Arthritis/Rheumatoid Paternal Aunt   . Arthritis/Rheumatoid Paternal Uncle   . Breast cancer Neg Hx    Social History   Social History  . Marital status: Widowed    Spouse name: N/A  . Number of children: 1  . Years of education: N/A   Social History Main Topics  . Smoking status: Former Smoker    Quit date: 12/30/1988  .  Smokeless tobacco: Never Used  . Alcohol use No  . Drug use: No  . Sexual activity: Not Asked   Other Topics Concern  . None   Social History Narrative  . None    Outpatient Encounter Prescriptions as of 07/01/2017  Medication Sig  . abatacept (ORENCIA) 250 MG injection Once every month  . albuterol (PROVENTIL HFA;VENTOLIN HFA) 108 (90 BASE) MCG/ACT inhaler Inhale 2 puffs into the lungs every 6 (six) hours as needed for wheezing or shortness of breath.  Marland Kitchen aspirin 81 MG chewable tablet Chew 81 mg by mouth daily.  . cholecalciferol (VITAMIN D) 1000 UNITS tablet Take 1,000 Units by mouth daily.  . dabigatran (PRADAXA) 75 MG CAPS capsule Take 75 mg by mouth 2 (two) times daily.  . fluticasone (FLONASE) 50 MCG/ACT nasal spray Place 2 sprays into the nose daily as needed.  Marland Kitchen losartan (COZAAR) 100 MG tablet TAKE 1 TABLET (100 MG TOTAL) BY MOUTH DAILY.  Marland Kitchen lovastatin (MEVACOR) 10 MG tablet Take 1 tablet (10 mg total) by mouth at bedtime.  . magnesium oxide (MAG-OX) 400 MG tablet Take 400 mg by mouth daily.  . methotrexate (RHEUMATREX) 2.5 MG tablet Take 2.5 mg by mouth once a week.   Marland Kitchen  valACYclovir (VALTREX) 1000 MG tablet Take 1 tablet (1,000 mg total) by mouth 2 (two) times daily.  . [DISCONTINUED] amLODipine (NORVASC) 2.5 MG tablet TAKE 1 TABLET (2.5 MG TOTAL) BY MOUTH DAILY.  . calcium gluconate 650 MG tablet Take 650 mg by mouth daily.  . clopidogrel (PLAVIX) 75 MG tablet Take 75 mg by mouth daily.  . [DISCONTINUED] benzonatate (TESSALON) 100 MG capsule Take 1 capsule (100 mg total) by mouth 2 (two) times daily as needed for cough.  . [DISCONTINUED] cefdinir (OMNICEF) 300 MG capsule Take 1 capsule (300 mg total) by mouth 2 (two) times daily.   No facility-administered encounter medications on file as of 07/01/2017.     Review of Systems  Constitutional: Negative for appetite change.       Weight down some.   HENT: Negative for congestion and sinus pressure.        Persistent hoarseness.     Eyes: Negative for pain and visual disturbance.  Respiratory: Negative for cough, chest tightness and shortness of breath.   Cardiovascular: Negative for chest pain and palpitations.  Gastrointestinal: Negative for abdominal pain, diarrhea, nausea and vomiting.  Genitourinary: Negative for difficulty urinating and dysuria.  Musculoskeletal: Negative for back pain and myalgias.  Skin: Negative for color change and rash.  Neurological: Negative for dizziness, light-headedness and headaches.  Hematological: Negative for adenopathy. Does not bruise/bleed easily.  Psychiatric/Behavioral: Negative for agitation and dysphoric mood.       Objective:    Physical Exam  Constitutional: She is oriented to person, place, and time. She appears well-developed and well-nourished. No distress.  HENT:  Nose: Nose normal.  Mouth/Throat: Oropharynx is clear and moist.  Eyes: Right eye exhibits no discharge. Left eye exhibits no discharge. No scleral icterus.  Neck: Neck supple. No thyromegaly present.  Cardiovascular: Normal rate and regular rhythm.   Pulmonary/Chest: Breath sounds normal. No accessory muscle usage. No tachypnea. No respiratory distress. She has no decreased breath sounds. She has no wheezes. She has no rhonchi. Right breast exhibits no inverted nipple, no mass, no nipple discharge and no tenderness (no axillary adenopathy). Left breast exhibits no inverted nipple, no mass, no nipple discharge and no tenderness (no axilarry adenopathy).  Abdominal: Soft. Bowel sounds are normal. There is no tenderness.  Musculoskeletal: She exhibits no edema or tenderness.  Lymphadenopathy:    She has no cervical adenopathy.  Neurological: She is alert and oriented to person, place, and time.  Skin: Skin is warm. No rash noted. No erythema.  Psychiatric: She has a normal mood and affect. Her behavior is normal.    BP 140/88 (BP Location: Left Arm, Patient Position: Sitting, Cuff Size: Normal)    Pulse 70   Temp 98.1 F (36.7 C) (Oral)   Resp 12   Ht 5\' 2"  (1.575 m)   Wt 112 lb 3.2 oz (50.9 kg)   SpO2 98%   BMI 20.52 kg/m  Wt Readings from Last 3 Encounters:  07/01/17 112 lb 3.2 oz (50.9 kg)  04/21/17 114 lb 6.4 oz (51.9 kg)  02/24/17 118 lb 6.4 oz (53.7 kg)     Lab Results  Component Value Date   WBC 7.5 09/05/2016   HGB 13.4 09/05/2016   HCT 39.7 09/05/2016   PLT 235.0 09/05/2016   GLUCOSE 82 02/06/2017   CHOL 192 02/06/2017   TRIG 115.0 02/06/2017   HDL 46.70 02/06/2017   LDLDIRECT 167.8 12/20/2013   LDLCALC 122 (H) 02/06/2017   ALT 22 02/06/2017   AST  27 02/06/2017   NA 131 (L) 02/06/2017   K 4.3 02/06/2017   CL 99 02/06/2017   CREATININE 1.01 02/06/2017   BUN 13 02/06/2017   CO2 26 02/06/2017   TSH 2.81 09/05/2016   INR 1.1 (H) 12/06/2013   HGBA1C 5.9 02/06/2017    Mm Screening Breast Tomo Bilateral  Result Date: 05/28/2017 CLINICAL DATA:  Screening. EXAM: 2D DIGITAL SCREENING BILATERAL MAMMOGRAM WITH CAD AND ADJUNCT TOMO COMPARISON:  Previous exam(s). ACR Breast Density Category b: There are scattered areas of fibroglandular density. FINDINGS: There are no findings suspicious for malignancy. Images were processed with CAD. IMPRESSION: No mammographic evidence of malignancy. A result letter of this screening mammogram will be mailed directly to the patient. RECOMMENDATION: Screening mammogram in one year. (Code:SM-B-01Y) BI-RADS CATEGORY  1: Negative. Electronically Signed   By: Edwin Cap M.D.   On: 05/28/2017 14:22       Assessment & Plan:   Problem List Items Addressed This Visit    Health care maintenance    Physical today 07/01/17.  PAP 04/22/14 - negative with negative HPV.  Mammogram 05/28/17 -  Birads I.  Colonoscopy 08/2014.  Recommended f/u in 08/2019.        History of cerebral aneurysm     S/p stenting.  Recent MRA no aneurysm.  Being followed at [facility].  Recommended f/u MRA in one year.  Off plavix. Recommended  lifelong aspirin.        Hoarseness    Persistent hoarseness.  Started on zantac.  Take twice a day.  Give persistence will have ENT evaluate.        Relevant Orders   Ambulatory referral to ENT   Hypercholesteremia    On lovastatin.  Taking 2-3 days per week.  Feels if takes more often, has aching.  Recent cholesterol improved.  Follow lipid panel and liver function tests.   Lab Results  Component Value Date   CHOL 192 02/06/2017   HDL 46.70 02/06/2017   LDLCALC 122 (H) 02/06/2017   LDLDIRECT 167.8 12/20/2013   TRIG 115.0 02/06/2017   CHOLHDL 4 02/06/2017        Relevant Orders   Lipid panel   HgB A1c   Hypertension    Blood pressure has been under control.  Slightly elevation on initial check.  Improved on recheck.  Follow pressures.  Follow metabolic panel.        Relevant Orders   Hepatic function panel   Basic Metabolic Panel (BMET)   Osteoporosis    Discussed f/u bone density.  Scheduled.        Rheumatoid arthritis (HCC)    Followed by Dr Gavin Potters.  Stable.        Weight loss    States she is eating regular meals.  Follow.  No nausea or vomiting.  Follow closely.         Other Visit Diagnoses    Routine general medical examination at a health care facility    -  Primary   Hyperglycemia       Estrogen deficiency       Relevant Orders   DG Bone Density   Need for varicella vaccine           Dale [place], MD

## 2017-07-02 ENCOUNTER — Other Ambulatory Visit: Payer: Self-pay | Admitting: Internal Medicine

## 2017-07-04 ENCOUNTER — Encounter: Payer: Self-pay | Admitting: Internal Medicine

## 2017-07-04 DIAGNOSIS — R490 Dysphonia: Secondary | ICD-10-CM | POA: Insufficient documentation

## 2017-07-04 DIAGNOSIS — Z9889 Other specified postprocedural states: Secondary | ICD-10-CM

## 2017-07-04 DIAGNOSIS — Z8679 Personal history of other diseases of the circulatory system: Secondary | ICD-10-CM | POA: Insufficient documentation

## 2017-07-04 DIAGNOSIS — R634 Abnormal weight loss: Secondary | ICD-10-CM | POA: Insufficient documentation

## 2017-07-04 NOTE — Assessment & Plan Note (Signed)
States she is eating regular meals.  Follow.  No nausea or vomiting.  Follow closely.

## 2017-07-04 NOTE — Assessment & Plan Note (Signed)
Discussed f/u bone density.  Scheduled.

## 2017-07-04 NOTE — Assessment & Plan Note (Signed)
Persistent hoarseness.  Started on zantac.  Take twice a day.  Give persistence will have ENT evaluate.

## 2017-07-04 NOTE — Assessment & Plan Note (Signed)
Followed by Dr Kernodle.  Stable.  

## 2017-07-04 NOTE — Assessment & Plan Note (Signed)
On lovastatin.  Taking 2-3 days per week.  Feels if takes more often, has aching.  Recent cholesterol improved.  Follow lipid panel and liver function tests.   Lab Results  Component Value Date   CHOL 192 02/06/2017   HDL 46.70 02/06/2017   LDLCALC 122 (H) 02/06/2017   LDLDIRECT 167.8 12/20/2013   TRIG 115.0 02/06/2017   CHOLHDL 4 02/06/2017

## 2017-07-04 NOTE — Assessment & Plan Note (Addendum)
S/p stenting.  Recent MRA no aneurysm.  Being followed at [facility].  Recommended f/u MRA in one year.  Off plavix. Recommended lifelong aspirin.

## 2017-07-04 NOTE — Assessment & Plan Note (Signed)
Blood pressure has been under control.  Slightly elevation on initial check.  Improved on recheck.  Follow pressures.  Follow metabolic panel.

## 2017-07-07 DIAGNOSIS — I724 Aneurysm of artery of lower extremity: Secondary | ICD-10-CM | POA: Diagnosis not present

## 2017-07-08 ENCOUNTER — Other Ambulatory Visit (INDEPENDENT_AMBULATORY_CARE_PROVIDER_SITE_OTHER): Payer: Medicare HMO

## 2017-07-08 DIAGNOSIS — E7800 Pure hypercholesterolemia, unspecified: Secondary | ICD-10-CM | POA: Diagnosis not present

## 2017-07-08 DIAGNOSIS — I10 Essential (primary) hypertension: Secondary | ICD-10-CM

## 2017-07-08 LAB — BASIC METABOLIC PANEL
BUN: 12 mg/dL (ref 6–23)
CHLORIDE: 98 meq/L (ref 96–112)
CO2: 23 meq/L (ref 19–32)
Calcium: 9 mg/dL (ref 8.4–10.5)
Creatinine, Ser: 0.99 mg/dL (ref 0.40–1.20)
GFR: 58.51 mL/min — ABNORMAL LOW (ref >60.00)
Glucose, Bld: 88 mg/dL (ref 70–99)
Potassium: 4.4 mEq/L (ref 3.5–5.1)
Sodium: 129 mEq/L — ABNORMAL LOW (ref 135–145)

## 2017-07-08 LAB — HEPATIC FUNCTION PANEL
ALBUMIN: 4 g/dL (ref 3.5–5.2)
ALK PHOS: 86 U/L (ref 39–117)
ALT: 13 U/L (ref 0–35)
AST: 19 U/L (ref 0–37)
BILIRUBIN DIRECT: 0.1 mg/dL (ref 0.0–0.3)
BILIRUBIN TOTAL: 0.8 mg/dL (ref 0.2–1.2)
Total Protein: 6.5 g/dL (ref 6.0–8.3)

## 2017-07-08 LAB — LIPID PANEL
CHOL/HDL RATIO: 4
Cholesterol: 168 mg/dL (ref 0–200)
HDL: 42.6 mg/dL (ref >39.00)
LDL CALC: 102 mg/dL — AB (ref 0–99)
NonHDL: 125.2
TRIGLYCERIDES: 116 mg/dL (ref 0.0–149.0)
VLDL: 23.2 mg/dL (ref 0.0–40.0)

## 2017-07-08 LAB — HEMOGLOBIN A1C: HEMOGLOBIN A1C: 5.8 % (ref 4.6–6.5)

## 2017-07-08 NOTE — Addendum Note (Signed)
Addended by: Warden Fillers on: 07/08/2017 10:30 AM   Modules accepted: Orders

## 2017-07-09 ENCOUNTER — Telehealth: Payer: Self-pay | Admitting: Radiology

## 2017-07-09 NOTE — Telephone Encounter (Signed)
PT coming in for labs Friday, please place future orders.Thank you  

## 2017-07-10 ENCOUNTER — Other Ambulatory Visit: Payer: Self-pay | Admitting: Internal Medicine

## 2017-07-10 DIAGNOSIS — E871 Hypo-osmolality and hyponatremia: Secondary | ICD-10-CM

## 2017-07-10 NOTE — Telephone Encounter (Signed)
Order placed for labs.

## 2017-07-10 NOTE — Progress Notes (Signed)
Order placed for f/u met b 

## 2017-07-11 ENCOUNTER — Other Ambulatory Visit: Payer: Self-pay | Admitting: Internal Medicine

## 2017-07-11 ENCOUNTER — Other Ambulatory Visit (INDEPENDENT_AMBULATORY_CARE_PROVIDER_SITE_OTHER): Payer: Medicare HMO

## 2017-07-11 DIAGNOSIS — E871 Hypo-osmolality and hyponatremia: Secondary | ICD-10-CM | POA: Diagnosis not present

## 2017-07-11 LAB — BASIC METABOLIC PANEL
BUN: 14 mg/dL (ref 6–23)
CHLORIDE: 100 meq/L (ref 96–112)
CO2: 21 meq/L (ref 19–32)
Calcium: 9.1 mg/dL (ref 8.4–10.5)
Creatinine, Ser: 0.98 mg/dL (ref 0.40–1.20)
GFR: 59.2 mL/min — ABNORMAL LOW (ref >60.00)
GLUCOSE: 89 mg/dL (ref 70–99)
POTASSIUM: 4.2 meq/L (ref 3.5–5.1)
SODIUM: 131 meq/L — AB (ref 135–145)

## 2017-07-11 NOTE — Progress Notes (Signed)
Order placed for f/u sodium check.   

## 2017-07-14 ENCOUNTER — Telehealth: Payer: Self-pay | Admitting: Internal Medicine

## 2017-07-14 NOTE — Telephone Encounter (Signed)
Pt called back returning your call. Please advise, thank you!  Call pt @ [phone]

## 2017-07-15 DIAGNOSIS — Z79899 Other long term (current) drug therapy: Secondary | ICD-10-CM | POA: Diagnosis not present

## 2017-07-15 DIAGNOSIS — M0579 Rheumatoid arthritis with rheumatoid factor of multiple sites without organ or systems involvement: Secondary | ICD-10-CM | POA: Diagnosis not present

## 2017-07-15 NOTE — Telephone Encounter (Signed)
Patient informed of labs app made. Copy of labs mailed

## 2017-07-15 NOTE — Telephone Encounter (Signed)
See lab results.  

## 2017-07-15 NOTE — Telephone Encounter (Signed)
Pt called back in regards to labs. Please advise, thank you!  Call pt @ [phone]

## 2017-07-21 ENCOUNTER — Other Ambulatory Visit (INDEPENDENT_AMBULATORY_CARE_PROVIDER_SITE_OTHER): Payer: Medicare HMO

## 2017-07-21 DIAGNOSIS — E871 Hypo-osmolality and hyponatremia: Secondary | ICD-10-CM

## 2017-07-21 DIAGNOSIS — M810 Age-related osteoporosis without current pathological fracture: Secondary | ICD-10-CM | POA: Diagnosis not present

## 2017-07-21 LAB — HM DEXA SCAN

## 2017-07-21 LAB — SODIUM: SODIUM: 132 meq/L — AB (ref 135–145)

## 2017-07-30 ENCOUNTER — Telehealth: Payer: Self-pay

## 2017-07-30 NOTE — Telephone Encounter (Signed)
Results received from patients Bone density done at [facility]. Placed in your yellow folder for review.

## 2017-07-30 NOTE — Telephone Encounter (Signed)
Bone density reviewed.  Notify pt that bone density reveals osteopenia.  (some bone loss, but does not meet criteria for osteoporosis).  Dietary calcium.  Weight bearing exercise.  (walking, walking with small hand weights).  Can d/w her more at her next appt.

## 2017-07-30 NOTE — Telephone Encounter (Signed)
Called patient reviewed. She did not have any questions. Per request from patient I have mailed list of foods and labs from 7/10

## 2017-08-04 DIAGNOSIS — R490 Dysphonia: Secondary | ICD-10-CM | POA: Diagnosis not present

## 2017-08-07 ENCOUNTER — Telehealth: Payer: Self-pay | Admitting: Internal Medicine

## 2017-08-07 NOTE — Telephone Encounter (Signed)
Pt called requesting a refill on her pantoprazole. Pt states that she ran out sooner because she was 2 a day for a while. Please advise, thank you!  Pharmacy - [facility] [phone] - [place], [place] - [address]  Call pt @ [phone]

## 2017-08-07 NOTE — Telephone Encounter (Signed)
Dr. Jayme Cloud has been prescribing this medication for the pt. Last refill was 05/30/2017. Left message for pt to call back to ask if this provider is not filling this medication for her anymore and what mg she takes.

## 2017-08-07 NOTE — Telephone Encounter (Signed)
Ok to give refill early

## 2017-08-07 NOTE — Telephone Encounter (Signed)
I do not see in drug history where we prescribe protonix.  I may be missing.  Please confirm with pharmacy who prescribed.  Confirm with pt what she is taking.

## 2017-08-08 MED ORDER — PANTOPRAZOLE SODIUM 40 MG PO TBEC: 40.0000 mg | DELAYED_RELEASE_TABLET | Freq: Every day | ORAL | 2 refills | Status: DC

## 2017-08-08 NOTE — Telephone Encounter (Signed)
ok'd refill for protonix #30 with 2 refills.  

## 2017-08-08 NOTE — Telephone Encounter (Signed)
Patient has been informed.

## 2017-08-08 NOTE — Telephone Encounter (Signed)
Patient is unsure of who that provider is.  Patient would like to have refill of her pantoprazole 40mg . Please advise.

## 2017-08-12 DIAGNOSIS — M0579 Rheumatoid arthritis with rheumatoid factor of multiple sites without organ or systems involvement: Secondary | ICD-10-CM | POA: Diagnosis not present

## 2017-08-14 ENCOUNTER — Ambulatory Visit (INDEPENDENT_AMBULATORY_CARE_PROVIDER_SITE_OTHER): Payer: Medicare HMO

## 2017-08-14 ENCOUNTER — Ambulatory Visit (INDEPENDENT_AMBULATORY_CARE_PROVIDER_SITE_OTHER): Payer: Medicare HMO | Admitting: Internal Medicine

## 2017-08-14 ENCOUNTER — Encounter: Payer: Self-pay | Admitting: Internal Medicine

## 2017-08-14 VITALS — BP 136/82 | HR 69 | Temp 98.4°F | Resp 14 | Ht 62.0 in | Wt 112.2 lb

## 2017-08-14 DIAGNOSIS — R05 Cough: Secondary | ICD-10-CM

## 2017-08-14 DIAGNOSIS — R490 Dysphonia: Secondary | ICD-10-CM | POA: Diagnosis not present

## 2017-08-14 DIAGNOSIS — M8580 Other specified disorders of bone density and structure, unspecified site: Secondary | ICD-10-CM | POA: Diagnosis not present

## 2017-08-14 DIAGNOSIS — R739 Hyperglycemia, unspecified: Secondary | ICD-10-CM

## 2017-08-14 DIAGNOSIS — E7800 Pure hypercholesterolemia, unspecified: Secondary | ICD-10-CM

## 2017-08-14 DIAGNOSIS — I10 Essential (primary) hypertension: Secondary | ICD-10-CM

## 2017-08-14 DIAGNOSIS — Z8679 Personal history of other diseases of the circulatory system: Secondary | ICD-10-CM

## 2017-08-14 DIAGNOSIS — E871 Hypo-osmolality and hyponatremia: Secondary | ICD-10-CM | POA: Diagnosis not present

## 2017-08-14 DIAGNOSIS — R0989 Other specified symptoms and signs involving the circulatory and respiratory systems: Secondary | ICD-10-CM

## 2017-08-14 DIAGNOSIS — I700 Atherosclerosis of aorta: Secondary | ICD-10-CM | POA: Diagnosis not present

## 2017-08-14 DIAGNOSIS — Z9889 Other specified postprocedural states: Secondary | ICD-10-CM

## 2017-08-14 DIAGNOSIS — R634 Abnormal weight loss: Secondary | ICD-10-CM

## 2017-08-14 DIAGNOSIS — M069 Rheumatoid arthritis, unspecified: Secondary | ICD-10-CM | POA: Diagnosis not present

## 2017-08-14 DIAGNOSIS — R053 Chronic cough: Secondary | ICD-10-CM

## 2017-08-14 NOTE — Progress Notes (Signed)
Patient ID: Erica Little, female   DOB: 07-18-1946, 71 y.o.   MRN: [id number]   Subjective:    Patient ID: Erica Little, female    DOB: September 14, 1946, 71 y.o.   MRN: [id number]  HPI  Patient here for a scheduled follow up.  Is s/p repair of a cerebral aneurysm via percutaneous access through the right groin.  Subsequently had pseudoaneurysm and thigh hematoma.  S/p hematoma evacuation by Dr Laverta Baltimore 5/311/17.  Just reevaluated.  ABIs - no evidence of arterial occlusive disease.  Was released.  She is still being followed for the cerebral aneurysm.  Recommended f/u MRA in one year after last.  She reports she is doing well.  No headache.  No chest pain.  No sob.  No acid reflux.  No abdominal pain.  Bowels moving.  She reports persistent hoarseness.  Seeing ENT.  Is some better.  With persistent cough. ENT - reported no concern regarding acid reflux.  She reports no acid reflux.  Discussed bone density results.  She desires no further medication at this time.  Weight bearing exercise.  Dietary calcium.     Past Medical History:  Diagnosis Date  . Family history of brain aneurysm   . GERD (gastroesophageal reflux disease)   . Hyperlipidemia   . Hypertension   . MRSA (methicillin resistant Staphylococcus aureus)    skin infections  . Rheumatoid arthritis(714.0)    transaminitis on MTX, remicade rxn, s/p sulfasalazine, orencia  . Ulcer disease    Past Surgical History:  Procedure Laterality Date  . CHOLECYSTECTOMY  2010  . INTRACRANIAL ANEURYSM REPAIR  05/29/2016  . MCP replacements  2001   left  . TUBAL LIGATION     Family History  Problem Relation Age of Onset  . Heart disease Mother   . Diabetes Mother   . Hypercholesterolemia Mother   . Heart disease Father   . Heart disease Brother        MI age 48  . Hypercholesterolemia Sister   . Colon polyps Sister   . Arthritis/Rheumatoid Paternal Aunt   . Arthritis/Rheumatoid Paternal Uncle   . Breast cancer Neg Hx    Social History    Social History  . Marital status: Widowed    Spouse name: N/A  . Number of children: 1  . Years of education: N/A   Social History Main Topics  . Smoking status: Former Smoker    Quit date: 12/30/1988  . Smokeless tobacco: Never Used  . Alcohol use No  . Drug use: No  . Sexual activity: Not Asked   Other Topics Concern  . None   Social History Narrative  . None    Outpatient Encounter Prescriptions as of 08/14/2017  Medication Sig  . abatacept (ORENCIA) 250 MG injection Once every month  . albuterol (PROVENTIL HFA;VENTOLIN HFA) 108 (90 BASE) MCG/ACT inhaler Inhale 2 puffs into the lungs every 6 (six) hours as needed for wheezing or shortness of breath.  Marland Kitchen amLODipine (NORVASC) 2.5 MG tablet TAKE 1 TABLET (2.5 MG TOTAL) BY MOUTH DAILY.  Marland Kitchen aspirin 81 MG chewable tablet Chew 81 mg by mouth daily.  . calcium gluconate 650 MG tablet Take 650 mg by mouth daily.  . cholecalciferol (VITAMIN D) 1000 UNITS tablet Take 1,000 Units by mouth daily.  . dabigatran (PRADAXA) 75 MG CAPS capsule Take 75 mg by mouth 2 (two) times daily.  . fluticasone (FLONASE) 50 MCG/ACT nasal spray Place 2 sprays into the nose daily as needed.  Marland Kitchen  losartan (COZAAR) 100 MG tablet TAKE 1 TABLET (100 MG TOTAL) BY MOUTH DAILY.  Marland Kitchen lovastatin (MEVACOR) 10 MG tablet Take 1 tablet (10 mg total) by mouth at bedtime.  . magnesium oxide (MAG-OX) 400 MG tablet Take 400 mg by mouth daily.  . methotrexate (RHEUMATREX) 2.5 MG tablet Take 2.5 mg by mouth once a week.   . pantoprazole (PROTONIX) 40 MG tablet Take 1 tablet (40 mg total) by mouth daily.  . valACYclovir (VALTREX) 1000 MG tablet Take 1 tablet (1,000 mg total) by mouth 2 (two) times daily.  . [DISCONTINUED] clopidogrel (PLAVIX) 75 MG tablet Take 75 mg by mouth daily.   No facility-administered encounter medications on file as of 08/14/2017.     Review of Systems  Constitutional: Negative for appetite change and unexpected weight change.  HENT: Negative for  congestion and sinus pressure.   Respiratory: Negative for cough, chest tightness and shortness of breath.   Cardiovascular: Negative for chest pain, palpitations and leg swelling.  Gastrointestinal: Negative for abdominal pain, diarrhea, nausea and vomiting.  Genitourinary: Negative for difficulty urinating and dysuria.  Musculoskeletal:       Joint pain.  Seeing Dr Gavin Potters.  On MTX and orencia.    Skin: Negative for color change and rash.  Neurological: Negative for dizziness, light-headedness and headaches.  Psychiatric/Behavioral: Negative for agitation and dysphoric mood.       Objective:    Physical Exam  Constitutional: She appears well-developed and well-nourished. No distress.  HENT:  Nose: Nose normal.  Mouth/Throat: Oropharynx is clear and moist.  Neck: Neck supple. No thyromegaly present.  Cardiovascular: Normal rate and regular rhythm.   Pulmonary/Chest: Breath sounds normal. No respiratory distress. She has no wheezes.  Abdominal: Soft. Bowel sounds are normal. There is no tenderness.  Musculoskeletal: She exhibits no edema or tenderness.  Lymphadenopathy:    She has no cervical adenopathy.  Skin: No rash noted. No erythema.  Psychiatric: She has a normal mood and affect. Her behavior is normal.    BP 136/82 (BP Location: Left Arm, Patient Position: Sitting, Cuff Size: Normal)   Pulse 69   Temp 98.4 F (36.9 C) (Oral)   Resp 14   Ht 5\' 2"  (1.575 m)   Wt 112 lb 3.2 oz (50.9 kg)   SpO2 98%   BMI 20.52 kg/m  Wt Readings from Last 3 Encounters:  08/14/17 112 lb 3.2 oz (50.9 kg)  07/01/17 112 lb 3.2 oz (50.9 kg)  04/21/17 114 lb 6.4 oz (51.9 kg)     Lab Results  Component Value Date   WBC 7.5 09/05/2016   HGB 13.4 09/05/2016   HCT 39.7 09/05/2016   PLT 235.0 09/05/2016   GLUCOSE 89 07/11/2017   CHOL 168 07/08/2017   TRIG 116.0 07/08/2017   HDL 42.60 07/08/2017   LDLDIRECT 167.8 12/20/2013   LDLCALC 102 (H) 07/08/2017   ALT 13 07/08/2017   AST 19  07/08/2017   NA 132 (L) 07/21/2017   K 4.2 07/11/2017   CL 100 07/11/2017   CREATININE 0.98 07/11/2017   BUN 14 07/11/2017   CO2 21 07/11/2017   TSH 2.81 09/05/2016   INR 1.1 (H) 12/06/2013   HGBA1C 5.8 07/08/2017    Mm Screening Breast Tomo Bilateral  Result Date: 05/28/2017 CLINICAL DATA:  Screening. EXAM: 2D DIGITAL SCREENING BILATERAL MAMMOGRAM WITH CAD AND ADJUNCT TOMO COMPARISON:  Previous exam(s). ACR Breast Density Category b: There are scattered areas of fibroglandular density. FINDINGS: There are no findings suspicious for malignancy.  Images were processed with CAD. IMPRESSION: No mammographic evidence of malignancy. A result letter of this screening mammogram will be mailed directly to the patient. RECOMMENDATION: Screening mammogram in one year. (Code:SM-B-01Y) BI-RADS CATEGORY  1: Negative. Electronically Signed   By: Everlean Alstrom M.D.   On: 05/28/2017 14:22       Assessment & Plan:   Problem List Items Addressed This Visit    Aortic atherosclerosis ([place])    On lovastatin.        Carotid bruit    Followed by [facility] - Dr Delana Meyer.       History of cerebral aneurysm     S/p stenting.  Being followed at [facility].  Recommended f/u MRA in one year.  Off plavix. On asprin.  Recommended continue lifelong aspirin.        Hoarseness    Persistent hoarseness.  Is better.  Saw ENT.  Recommended voice rest.  She plans to f/u with ENT.        Hypercholesteremia    On lovastatin.  Cholesterol has improved.  Discussed with her today.  Would like for her to increase lovastatin to 4x/week.  Follow lipid panel and liver function tests.   Lab Results  Component Value Date   CHOL 168 07/08/2017   HDL 42.60 07/08/2017   LDLCALC 102 (H) 07/08/2017   LDLDIRECT 167.8 12/20/2013   TRIG 116.0 07/08/2017   CHOLHDL 4 07/08/2017        Relevant Orders   Hepatic function panel   Lipid panel   Hyperglycemia    Follow met b and a1c.        Relevant Orders    Hemoglobin A1c   Hypertension    Blood pressure under good control.  Continue same medication regimen.  Follow pressures.  Follow metabolic panel.        Relevant Orders   CBC with Differential/Platelet   TSH   Basic metabolic panel   Hyponatremia    Sodium slightly decreased.  Last check stable.  Follow sodium level.        Osteopenia    Discussed bone density results.  Discussed treatment options.  She is not interested at this time is starting a new medication.  Follow.  Dietary calcium.  Weight bearing exercise.        Relevant Orders   VITAMIN D 25 Hydroxy (Vit-D Deficiency, Fractures)   Persistent cough - Primary    Persistent cough.  Seeing ENT.  Check cxr.  May need pulmonary evaluation.        Relevant Orders   DG Chest 2 View (Completed)   Rheumatoid arthritis ([place])    Followed by Dr Jefm Bryant.  On MTX and orencia.  Joints stable.        Weight loss    Weight stable from the previous check.  Eating well.  Follow.           Einar Pheasant, MD

## 2017-08-14 NOTE — Progress Notes (Signed)
Pre-visit discussion using our clinic review tool. No additional management support is needed unless otherwise documented below in the visit note.  

## 2017-08-15 ENCOUNTER — Encounter: Payer: Self-pay | Admitting: Internal Medicine

## 2017-08-15 DIAGNOSIS — I700 Atherosclerosis of aorta: Secondary | ICD-10-CM | POA: Insufficient documentation

## 2017-08-17 ENCOUNTER — Other Ambulatory Visit: Payer: Self-pay | Admitting: Internal Medicine

## 2017-08-17 ENCOUNTER — Encounter: Payer: Self-pay | Admitting: Internal Medicine

## 2017-08-17 DIAGNOSIS — R739 Hyperglycemia, unspecified: Secondary | ICD-10-CM | POA: Insufficient documentation

## 2017-08-17 DIAGNOSIS — R9389 Abnormal findings on diagnostic imaging of other specified body structures: Secondary | ICD-10-CM

## 2017-08-17 DIAGNOSIS — R053 Chronic cough: Secondary | ICD-10-CM

## 2017-08-17 DIAGNOSIS — R05 Cough: Secondary | ICD-10-CM

## 2017-08-17 NOTE — Assessment & Plan Note (Signed)
On lovastatin.  Cholesterol has improved.  Discussed with her today.  Would like for her to increase lovastatin to 4x/week.  Follow lipid panel and liver function tests.   Lab Results  Component Value Date   CHOL 168 07/08/2017   HDL 42.60 07/08/2017   LDLCALC 102 (H) 07/08/2017   LDLDIRECT 167.8 12/20/2013   TRIG 116.0 07/08/2017   CHOLHDL 4 07/08/2017

## 2017-08-17 NOTE — Progress Notes (Signed)
Order placed for pulmonary referral.  

## 2017-08-17 NOTE — Assessment & Plan Note (Signed)
Followed by [facility] - Dr Gilda Crease.

## 2017-08-17 NOTE — Assessment & Plan Note (Signed)
Blood pressure under good control.  Continue same medication regimen.  Follow pressures.  Follow metabolic panel.   

## 2017-08-17 NOTE — Assessment & Plan Note (Signed)
On lovastatin 

## 2017-08-17 NOTE — Assessment & Plan Note (Signed)
Follow met b and a1c.

## 2017-08-17 NOTE — Assessment & Plan Note (Signed)
Followed by Dr Gavin Potters.  On MTX and orencia.  Joints stable.

## 2017-08-17 NOTE — Assessment & Plan Note (Signed)
Discussed bone density results.  Discussed treatment options.  She is not interested at this time is starting a new medication.  Follow.  Dietary calcium.  Weight bearing exercise.

## 2017-08-17 NOTE — Assessment & Plan Note (Signed)
S/p stenting.  Being followed at [facility].  Recommended f/u MRA in one year.  Off plavix. On asprin.  Recommended continue lifelong aspirin.

## 2017-08-17 NOTE — Assessment & Plan Note (Signed)
Persistent hoarseness.  Is better.  Saw ENT.  Recommended voice rest.  She plans to f/u with ENT.

## 2017-08-17 NOTE — Assessment & Plan Note (Signed)
Persistent cough.  Seeing ENT.  Check cxr.  May need pulmonary evaluation.

## 2017-08-17 NOTE — Assessment & Plan Note (Signed)
Weight stable from the previous check.  Eating well.  Follow.

## 2017-08-17 NOTE — Assessment & Plan Note (Signed)
Sodium slightly decreased.  Last check stable.  Follow sodium level.

## 2017-08-18 ENCOUNTER — Telehealth: Payer: Self-pay | Admitting: Internal Medicine

## 2017-08-18 NOTE — Telephone Encounter (Signed)
Pt called and left a voicemail stating that she has taken in to consideration the doctor that Dr. Lorin Picket had suggested for pulmonary, but pt would like to stay within . Please advise, thank you!  Call pt @ [phone]

## 2017-08-18 NOTE — Telephone Encounter (Signed)
Patient would like referral for [facility]

## 2017-08-19 NOTE — Telephone Encounter (Signed)
Per result note notification, pt had requested referral to [facility] pulmonary.  She now wants to go to [facility].  Do I need to place another order for referral.  Just let me know.  Thanks

## 2017-08-19 NOTE — Telephone Encounter (Signed)
No, I have sent this to [facility].

## 2017-08-26 ENCOUNTER — Ambulatory Visit (INDEPENDENT_AMBULATORY_CARE_PROVIDER_SITE_OTHER): Payer: Medicare HMO | Admitting: Internal Medicine

## 2017-08-26 ENCOUNTER — Encounter: Payer: Self-pay | Admitting: Internal Medicine

## 2017-08-26 DIAGNOSIS — J849 Interstitial pulmonary disease, unspecified: Secondary | ICD-10-CM

## 2017-08-26 NOTE — Patient Instructions (Signed)
--  Will check CT chest and pulmonary function test.   --If you would like to start incruz (or spiriva) inhaler to see if it makes your cough better, please call back for a prescription.

## 2017-08-26 NOTE — Progress Notes (Signed)
[facility] Consultation      Assessment and Plan:  Chronic cough. -This is been present for approximately 5 years, has not responded to typical empiric therapies during that time. -Discussed with patient this is likely secondary to chronic bronchitis, may be related to underlying interstitial lung disease related to rheumatoid arthritis. We discussed the possibility of starting an anticholinergic inhaler to help treat her symptoms, she would like to hold off at this time. She feels the cough is not very problematic for her. We can revisit this at a future visit.  Rheumatoid arthritis associated interstitial lung disease. -Chronic interstitial changes seen on chest x-ray, though I do not appreciate any crackles on physical exam. She may have interstitial lung disease related to rheumatoid arthritis, and less likely to Methotrexate. -We'll check a CT high resolution, as well as pulmonary function test to look for evidence of interstitial lung disease, and follow-up in 3 months.   Date: 08/26/2017  MRN# [id number] Erica Little 27-Jul-1946    Erica Little is a 71 y.o. old female seen in consultation for chief complaint of:    Chief Complaint  Patient presents with  . pulmonary consult    per Dr. Lorin Picket- recent CXR 08/14/17. pt reports dry cough at times prod with clear mucus mainly in morning x5y    HPI:   The patient is here for a chronic cough which has been present for about 5 years. She saw Dr. Meredeth Ide years ago, was tried on advair but made it hard for her to breathe, so she stopped it. The cough is dry, she does not recall trying other things. She was a previous smoker, last in 1990's.  She has 1 cat, previously 3, she sleeps in the bedroom. She has tried cough drops and cough syrup which did not help. The cough is somewhat bothersome, it is worse if she has a bronchitis. She denies reflux, but she takes protonix due to a history of a bleeding ulcer. She denies  sinus problems, she has tried flonase and did not help with the cough. The cough can occasionally wake her from sleep but it is hard to tell because she wakes to go to the bathroom.   Chest x-ray images reviewed; chest x-ray 08/14/2017; bilateral interstitial changes, mild. Moderately progressed when compared with previous film 02/16/2016  Pt is s/p repair of a cerebral aneurysm via percutaneous access through the right groin.  Subsequently had pseudoaneurysm and thigh hematoma.  S/p hematoma evacuation by Dr Jacqulyn Bath 5/311/17.  PMHX:   Past Medical History:  Diagnosis Date  . Family history of brain aneurysm   . GERD (gastroesophageal reflux disease)   . Hyperlipidemia   . Hypertension   . MRSA (methicillin resistant Staphylococcus aureus)    skin infections  . Rheumatoid arthritis(714.0)    transaminitis on MTX, remicade rxn, s/p sulfasalazine, orencia  . Ulcer disease    Surgical Hx:  Past Surgical History:  Procedure Laterality Date  . CHOLECYSTECTOMY  2010  . INTRACRANIAL ANEURYSM REPAIR  05/29/2016  . MCP replacements  2001   left  . TUBAL LIGATION     Family Hx:  Family History  Problem Relation Age of Onset  . Heart disease Mother   . Diabetes Mother   . Hypercholesterolemia Mother   . Heart disease Father   . Heart disease Brother        MI age 24  . Hypercholesterolemia Sister   . Colon polyps Sister   . Arthritis/Rheumatoid  Paternal Aunt   . Arthritis/Rheumatoid Paternal Uncle   . Breast cancer Neg Hx    Social Hx:   Social History  Substance Use Topics  . Smoking status: Former Smoker    Quit date: 12/30/1988  . Smokeless tobacco: Never Used  . Alcohol use No   Medication:    Current Outpatient Prescriptions:  .  abatacept (ORENCIA) 250 MG injection, Once every month, Disp: , Rfl:  .  albuterol (PROVENTIL HFA;VENTOLIN HFA) 108 (90 BASE) MCG/ACT inhaler, Inhale 2 puffs into the lungs every 6 (six) hours as needed for wheezing or shortness of breath., Disp:  1 Inhaler, Rfl: 2 .  amLODipine (NORVASC) 2.5 MG tablet, TAKE 1 TABLET (2.5 MG TOTAL) BY MOUTH DAILY., Disp: 90 tablet, Rfl: 1 .  aspirin 81 MG chewable tablet, Chew 81 mg by mouth daily., Disp: , Rfl:  .  calcium gluconate 650 MG tablet, Take 650 mg by mouth daily., Disp: , Rfl:  .  cholecalciferol (VITAMIN D) 1000 UNITS tablet, Take 1,000 Units by mouth daily., Disp: , Rfl:  .  dabigatran (PRADAXA) 75 MG CAPS capsule, Take 75 mg by mouth 2 (two) times daily., Disp: , Rfl:  .  fluticasone (FLONASE) 50 MCG/ACT nasal spray, Place 2 sprays into the nose daily as needed., Disp: , Rfl:  .  losartan (COZAAR) 100 MG tablet, TAKE 1 TABLET (100 MG TOTAL) BY MOUTH DAILY., Disp: 90 tablet, Rfl: 1 .  lovastatin (MEVACOR) 10 MG tablet, Take 1 tablet (10 mg total) by mouth at bedtime., Disp: 30 tablet, Rfl: 5 .  magnesium oxide (MAG-OX) 400 MG tablet, Take 400 mg by mouth daily., Disp: , Rfl:  .  methotrexate (RHEUMATREX) 2.5 MG tablet, Take 2.5 mg by mouth once a week. , Disp: , Rfl:  .  pantoprazole (PROTONIX) 40 MG tablet, Take 1 tablet (40 mg total) by mouth daily., Disp: 30 tablet, Rfl: 2 .  valACYclovir (VALTREX) 1000 MG tablet, Take 1 tablet (1,000 mg total) by mouth 2 (two) times daily., Disp: 20 tablet, Rfl: 0   Allergies:  Oxycodone; Remicade [infliximab]; Simvastatin; Sulfa antibiotics; Zetia [ezetimibe]; Latex; and Tape  Review of Systems: Gen:  Denies  fever, sweats, chills HEENT: Denies blurred vision, double vision. bleeds, sore throat Cvc:  No dizziness, chest pain. Resp:   Denies  shortness of breath Gi: Denies swallowing difficulty, stomach pain. Gu:  Denies bladder incontinence, burning urine Ext:   No Joint pain, stiffness. Skin: No skin rash,  hives  Endoc:  No polyuria, polydipsia. Psych: No depression, insomnia. Other:  All other systems were reviewed with the patient and were negative other that what is mentioned in the HPI.   Physical Examination:   VS: BP 128/70 (BP  Location: Right Arm, Cuff Size: Normal)   Pulse 83   Ht 4\' 11"  (1.499 m)   Wt 113 lb 3.2 oz (51.3 kg)   SpO2 100%   BMI 22.86 kg/m   General Appearance: No distress  Neuro:without focal findings,  speech normal,  HEENT: PERRLA, EOM intact.   Pulmonary: normal breath sounds, No wheezing.  CardiovascularNormal S1,S2.  No m/r/g.   Abdomen: Benign, Soft, non-tender. Renal:  No costovertebral tenderness  GU:  No performed at this time. Endoc: No evident thyromegaly, no signs of acromegaly. Skin:   warm, no rashes, no ecchymosis  Extremities: normal, no cyanosis, clubbing.  Other findings:    LABORATORY PANEL:   CBC No results for input(s): WBC, HGB, HCT, PLT in the last 168 hours. ------------------------------------------------------------------------------------------------------------------  Chemistries  No results for input(s): NA, K, CL, CO2, GLUCOSE, BUN, CREATININE, CALCIUM, MG, AST, ALT, ALKPHOS, BILITOT in the last 168 hours.  Invalid input(s): GFRCGP ------------------------------------------------------------------------------------------------------------------  Cardiac Enzymes No results for input(s): TROPONINI in the last 168 hours. ------------------------------------------------------------  RADIOLOGY:  No results found.     Thank  you for the consultation and for allowing [facility] to assist in the care of your patient. Our recommendations are noted above.  Please contact us if we can be of further service.   Wells Guiles, MD.  Board Certified in Internal Medicine, Pulmonary Medicine, Critical Care Medicine, and Sleep Medicine.  [place] Office Number: [phone]  Santiago Glad, M.D.  Billy Fischer, M.D  08/26/2017

## 2017-08-28 ENCOUNTER — Ambulatory Visit: Payer: Medicare HMO | Attending: Unknown Physician Specialty | Admitting: Speech Pathology

## 2017-08-28 DIAGNOSIS — R490 Dysphonia: Secondary | ICD-10-CM | POA: Diagnosis present

## 2017-08-29 ENCOUNTER — Encounter: Payer: Self-pay | Admitting: Speech Pathology

## 2017-08-29 NOTE — Therapy (Signed)
[place] [facility] MAIN [facility] SERVICES [address] [place], [place], [address] Phone: [phone]   Fax:  [phone]  Speech Language Pathology Evaluation  Patient Details  Name: Erica Little MRN: [id number] Date of Birth: 08-12-1946 Referring Provider: Dr. Jenne Campus  Encounter Date: 08/28/2017      End of Session - 08/29/17 1108    Visit Number 1   Number of Visits 9   Date for SLP Re-Evaluation 10/28/17   SLP Start Time 1300   SLP Stop Time  1355   SLP Time Calculation (min) 55 min   Activity Tolerance Patient tolerated treatment well      Past Medical History:  Diagnosis Date  . Family history of brain aneurysm   . GERD (gastroesophageal reflux disease)   . Hyperlipidemia   . Hypertension   . MRSA (methicillin resistant Staphylococcus aureus)    skin infections  . Rheumatoid arthritis(714.0)    transaminitis on MTX, remicade rxn, s/p sulfasalazine, orencia  . Ulcer disease     Past Surgical History:  Procedure Laterality Date  . CHOLECYSTECTOMY  2010  . INTRACRANIAL ANEURYSM REPAIR  05/29/2016  . MCP replacements  2001   left  . TUBAL LIGATION      There were no vitals filed for this visit.          SLP Evaluation [facility] - 08/29/17 0001      SLP Visit Information   SLP Received On 08/28/17   Referring Provider Dr. Jenne Campus   Onset Date 05/28/2017   Medical Diagnosis Dysphonia     Subjective   Subjective  The patient is not very concerned about her vocal quality, "I feel fine"   Patient/Family Stated Goal The patient wants to try voice therapy to see if it is worth her time to work on voice in view of her extensive medical issues.     General Information   HPI 71 year old woman, with 3 month history of hoarseness, referred by Dr. Jenne Campus for voice therapy.  Per report, abnormal laryngeal findings include: mild polypoid changes and early nodules.      Prior Functional Status   Cognitive/Linguistic Baseline Within  functional limits     Oral Motor/Sensory Function   Overall Oral Motor/Sensory Function Appears within functional limits for tasks assessed     Motor Speech   Overall Motor Speech Impaired at baseline   Respiration Impaired   Level of Impairment Conversation   Phonation Breathy;Hoarse;Low vocal intensity;Aphonic   Resonance Within functional limits   Articulation Within functional limitis   Intelligibility Intelligible   Phonation Impaired   Vocal Abuses Habitual Hyperphonia;Habitual Cough/Throat Clear;Glottal Attack;Prolonged Vocal Use;Vocal Fold Dehydration   Tension Present Jaw;Neck;Shoulder   Volume Soft   Pitch Low     Standardized Assessments   Standardized Assessments  Other Assessment  Perceptual Voice Evaluation      Perceptual Voice Evaluation Voice checklist: . Health risks: Complex medical history, GERD . Characteristic voice use: patient states she talks and sings a lot  . Environmental risks: speaks in loud environment . Misuse: excessive talking/singing, speak without adequate breath support . Abuse: coughing/throat clearing . Vocal characteristics: breathy, hoarse, strained, limited voice range, poor vocal projection, excessive pharyngeal resonance Maximum phonation time for sustained "ah": 5 seconds Average fundamental frequency during sustained "ah": 227 Hz Average time patient was able to sustain /s/: 8 seconds Average time patient was able to sustain /z/: Not able s/z ratio : N/A Education: Patient instructed in extrinsic laryngeal  muscle stretches, breath support exercises, and resonant voice exercises       ADULT SLP TREATMENT - Sep 28, 2017 0001      General Information   HPI 71 year old woman, with 3 month history of hoarseness, referred by Dr. Jenne Campus for voice therapy.  Per report, abnormal laryngeal findings include: mild polypoid changes and early nodules.            SLP Education - September 28, 2017 1107    Education provided Yes   Education  Details Patient instructed in extrinsic laryngeal muscle stretches, breath support exercises, and resonant voice exercises   Person(s) Educated Patient   Methods Explanation;Handout   Comprehension Verbalized understanding            SLP Long Term Goals - 2017/09/28 1110      SLP LONG TERM GOAL #1   Title The patient will demonstrate independent understanding of vocal hygiene concepts and extrinsic laryngeal muscle stretches.     Time 8   Period Weeks   Status New   Target Date 10/28/17     SLP LONG TERM GOAL #2   Title The patient will be independent for abdominal breathing and breath support exercises.   Time 8   Period Weeks   Status New   Target Date 10/28/17     SLP LONG TERM GOAL #3   Title The patient will minimize vocal tension via resonant voice therapy (or comparable technique) with min SLP cues with 80% accuracy.   Time 8   Period Weeks   Status New   Target Date 10/28/17     SLP LONG TERM GOAL #4   Title The patient will maintain relaxed phonation / oral resonance for paragraph length recitation with 80% accuracy.   Time 8   Period Weeks   Status New   Target Date 10/28/17          Plan - Sep 28, 2017 1108    Clinical Impression Statement This 28 year woman under the care of Dr. Jenne Campus, with mild polypoid changes and early nodules, is presenting with moderate dysphonia characterized by hoarse/strained vocal quality, reduced breath control for speech, reduced pitch range, and intrinsic/extrinsic laryngeal tension.  The patient will benefit from voice therapy for education (vocal hygiene, reflux precautions), to improve breath control for speech, reduce laryngeal tension, and relaxed phonation / oral resonance.  The patient is uncertain of speech therapy ("exercise wears me out" and "I have a lot of other things- medical- going on). However, she states willingness to try.      Speech Therapy Frequency 1x /week   Duration Other (comment)  8 weeks    Treatment/Interventions SLP instruction and feedback;Patient/family education;Other (comment)  Voice therapy   Potential to Achieve Goals Good   Potential Considerations Ability to learn/carryover information;Co-morbidities;Cooperation/participation level;Medical prognosis;Previous level of function;Severity of impairments;Family/community support;Pain level   SLP Home Exercise Plan extrinsic laryngeal muscle stretches, breath support exercises, and resonant voice exercises   Consulted and Agree with Plan of Care Patient      Patient will benefit from skilled therapeutic intervention in order to improve the following deficits and impairments:   Dysphonia - Plan: SLP plan of care cert/re-cert      G-Codes - 2017-09-28 1112    Functional Assessment Tool Used Perceptual voice evaluation, clinical judgment   Functional Limitations Voice   Voice Current Status (G9171) At least 40 percent but less than 60 percent impaired, limited or restricted   Voice Goal Status (G9172) At least 1 percent  but less than 20 percent impaired, limited or restricted      Problem List Patient Active Problem List   Diagnosis Date Noted  . Hyperglycemia 08/17/2017  . Aortic atherosclerosis (HCC) 08/15/2017  . History of cerebral aneurysm  07/04/2017  . Hoarseness 07/04/2017  . Weight loss 07/04/2017  . Dysuria 08/19/2016  . Genital herpes 08/19/2016  . Amaurosis fugax 04/08/2016  . Health care maintenance 05/03/2015  . Family history of colonic polyps 01/29/2015  . Ataxia 01/07/2015  . Herpes zoster 10/06/2014  . Gastritis 10/06/2014  . Abdominal bruit 09/21/2014  . Carotid bruit 09/21/2014  . Persistent cough 04/24/2014  . Osteopenia 01/11/2013  . Rheumatoid arthritis (HCC) 12/17/2012  . Hypertension 10/11/2012  . Hypercholesteremia 10/11/2012  . Hyponatremia 10/11/2012   Erica Primrose, MS/CCC- SLP  Erica Little 08/29/2017, 11:15 AM  [place] [facility] MAIN  [facility] SERVICES [address] [place], [place], [address] Phone: [phone]   Fax:  [phone]  Name: Erica Little MRN: [id number] Date of Birth: Dec 09, 1946

## 2017-09-09 DIAGNOSIS — M0579 Rheumatoid arthritis with rheumatoid factor of multiple sites without organ or systems involvement: Secondary | ICD-10-CM | POA: Diagnosis not present

## 2017-09-09 DIAGNOSIS — R05 Cough: Secondary | ICD-10-CM | POA: Diagnosis not present

## 2017-09-09 DIAGNOSIS — M810 Age-related osteoporosis without current pathological fracture: Secondary | ICD-10-CM | POA: Diagnosis not present

## 2017-09-10 ENCOUNTER — Encounter: Payer: Self-pay | Admitting: Speech Pathology

## 2017-09-10 ENCOUNTER — Ambulatory Visit: Payer: Medicare HMO | Attending: Unknown Physician Specialty | Admitting: Speech Pathology

## 2017-09-10 DIAGNOSIS — R490 Dysphonia: Secondary | ICD-10-CM | POA: Diagnosis present

## 2017-09-10 NOTE — Therapy (Signed)
[place] [facility] MAIN [facility] SERVICES [address] [place], [place], [address] Phone: [phone]   Fax:  [phone]  Speech Language Pathology Treatment  Patient Details  Name: Erica Little MRN: [id number] Date of Birth: May 04, 1946 Referring Provider: Dr. Jenne Campus  Encounter Date: 09/10/2017      End of Session - 09/10/17 1338    Visit Number 2   Number of Visits 9   Date for SLP Re-Evaluation 10/28/17   SLP Start Time 1100   SLP Stop Time  1150   SLP Time Calculation (min) 50 min   Activity Tolerance Patient tolerated treatment well      Past Medical History:  Diagnosis Date  . Family history of brain aneurysm   . GERD (gastroesophageal reflux disease)   . Hyperlipidemia   . Hypertension   . MRSA (methicillin resistant Staphylococcus aureus)    skin infections  . Rheumatoid arthritis(714.0)    transaminitis on MTX, remicade rxn, s/p sulfasalazine, orencia  . Ulcer disease     Past Surgical History:  Procedure Laterality Date  . CHOLECYSTECTOMY  2010  . INTRACRANIAL ANEURYSM REPAIR  05/29/2016  . MCP replacements  2001   left  . TUBAL LIGATION      There were no vitals filed for this visit.      Subjective Assessment - 09/10/17 1338    Subjective "I've been trying to breath right"   Currently in Pain? No/denies               ADULT SLP TREATMENT - 09/10/17 0001      General Information   Behavior/Cognition Alert;Cooperative;Pleasant mood   HPI 71 year old woman, with 3 month history of hoarseness, referred by Dr. Jenne Campus for voice therapy.  Per report, abnormal laryngeal findings include: mild polypoid changes and early nodules.      Treatment Provided   Treatment provided Cognitive-Linquistic     Pain Assessment   Pain Assessment No/denies pain     Cognitive-Linquistic Treatment   Treatment focused on Voice   Skilled Treatment The patient was provided with written and verbal teaching regarding vocal  hygiene.  The patient was provided with written and verbal teaching regarding neck, shoulder, tongue, and throat stretches exercises to promote relaxed phonation. The patient was provided with written and verbal teaching regarding breath support exercises.  Patient instructed in relaxed phonation / oral resonance. Improved vocal quality with vocal loudness and improved breath support.  Patient does better with minimal direct teaching, better with minimal cues "project your voice", "don't growl or squeak".     Assessment / Recommendations / Plan   Plan Continue with current plan of care     Progression Toward Goals   Progression toward goals Progressing toward goals          SLP Education - 09/10/17 1338    Education provided Yes   Education Details Use vocal loudness to reduce vocal strain   [employer]) Educated Patient   Methods Explanation   Comprehension Verbalized understanding            SLP Long Term Goals - 08/29/17 1110      SLP LONG TERM GOAL #1   Title The patient will demonstrate independent understanding of vocal hygiene concepts and extrinsic laryngeal muscle stretches.     Time 8   Period Weeks   Status New   Target Date 10/28/17     SLP LONG TERM GOAL #2   Title The patient will be independent  for abdominal breathing and breath support exercises.   Time 8   Period Weeks   Status New   Target Date 10/28/17     SLP LONG TERM GOAL #3   Title The patient will minimize vocal tension via resonant voice therapy (or comparable technique) with min SLP cues with 80% accuracy.   Time 8   Period Weeks   Status New   Target Date 10/28/17     SLP LONG TERM GOAL #4   Title The patient will maintain relaxed phonation / oral resonance for paragraph length recitation with 80% accuracy.   Time 8   Period Weeks   Status New   Target Date 10/28/17          Plan - 09/10/17 1339    Clinical Impression Statement  Patient able to improve vocal quality with vocal  loudness and breath support to decrease laryngeal strain.  She remains mildly raspy, but with improved vocal quality since the evaluation, 08/29/2017.   Speech Therapy Frequency 1x /week   Duration Other (comment)   Treatment/Interventions SLP instruction and feedback;Patient/family education;Other (comment)  Voice therapy   Potential to Achieve Goals Good   Potential Considerations Ability to learn/carryover information;Co-morbidities;Cooperation/participation level;Medical prognosis;Previous level of function;Severity of impairments;Family/community support;Pain level   SLP Home Exercise Plan extrinsic laryngeal muscle stretches, breath support exercises, and resonant voice exercises   Consulted and Agree with Plan of Care Patient      Patient will benefit from skilled therapeutic intervention in order to improve the following deficits and impairments:   Dysphonia    Problem List Patient Active Problem List   Diagnosis Date Noted  . Hyperglycemia 08/17/2017  . Aortic atherosclerosis (HCC) 08/15/2017  . History of cerebral aneurysm  07/04/2017  . Hoarseness 07/04/2017  . Weight loss 07/04/2017  . Dysuria 08/19/2016  . Genital herpes 08/19/2016  . Amaurosis fugax 04/08/2016  . Health care maintenance 05/03/2015  . Family history of colonic polyps 01/29/2015  . Ataxia 01/07/2015  . Herpes zoster 10/06/2014  . Gastritis 10/06/2014  . Abdominal bruit 09/21/2014  . Carotid bruit 09/21/2014  . Persistent cough 04/24/2014  . Osteopenia 01/11/2013  . Rheumatoid arthritis (HCC) 12/17/2012  . Hypertension 10/11/2012  . Hypercholesteremia 10/11/2012  . Hyponatremia 10/11/2012    Erica Little 09/10/2017, 1:40 PM  [place] [facility] MAIN [facility] SERVICES [address] [place], [place], [address] Phone: [phone]   Fax:  [phone]   Name: Erica Little MRN: [id number] Date of Birth: 28-Oct-1946

## 2017-09-24 ENCOUNTER — Encounter: Payer: Self-pay | Admitting: Speech Pathology

## 2017-09-24 ENCOUNTER — Ambulatory Visit: Payer: Medicare HMO | Admitting: Speech Pathology

## 2017-09-24 DIAGNOSIS — R490 Dysphonia: Secondary | ICD-10-CM | POA: Diagnosis not present

## 2017-09-24 NOTE — Therapy (Signed)
[place] MAIN [facility] SERVICES [address] [place], [place], [address] Phone: [phone]   Fax:  [phone]  Speech Language Pathology Treatment/Discharge Summary  Patient Details  Name: Erica Little MRN: [id number] Date of Birth: 03/07/1946 Referring Provider: Dr. Tami Ribas  Encounter Date: 09/24/2017      End of Session - 09/24/17 1434    Visit Number 3   Number of Visits 9   Date for SLP Re-Evaluation 10/28/17   SLP Start Time 8938   SLP Stop Time  1045   SLP Time Calculation (min) 43 min   Activity Tolerance Patient tolerated treatment well      Past Medical History:  Diagnosis Date  . Family history of brain aneurysm   . GERD (gastroesophageal reflux disease)   . Hyperlipidemia   . Hypertension   . MRSA (methicillin resistant Staphylococcus aureus)    skin infections  . Rheumatoid arthritis(714.0)    transaminitis on MTX, remicade rxn, s/p sulfasalazine, orencia  . Ulcer disease     Past Surgical History:  Procedure Laterality Date  . CHOLECYSTECTOMY  2010  . INTRACRANIAL ANEURYSM REPAIR  05/29/2016  . MCP replacements  2001   left  . TUBAL LIGATION      There were no vitals filed for this visit.      Subjective Assessment - 09/24/17 1433    Subjective "I'm pleased"   Currently in Pain? No/denies               ADULT SLP TREATMENT - 09/24/17 0001      General Information   Behavior/Cognition Alert;Cooperative;Pleasant mood   HPI 71 year old woman, with 3 month history of hoarseness, referred by Dr. Tami Ribas for voice therapy.  Per report, abnormal laryngeal findings include: mild polypoid changes and early nodules.      Treatment Provided   Treatment provided Cognitive-Linquistic     Pain Assessment   Pain Assessment No/denies pain     Cognitive-Linquistic Treatment   Treatment focused on Voice   Skilled Treatment The patient was provided with written and verbal teaching regarding vocal hygiene.   The patient was provided with written and verbal teaching regarding neck, shoulder, tongue, and throat stretches exercises to promote relaxed phonation. The patient was provided with written and verbal teaching regarding breath support exercises.  Patient instructed in relaxed phonation / oral resonance. Improved vocal quality with vocal loudness and improved breath support.  Patient does better with minimal direct teaching, better with minimal cues "project your voice", "don't growl or squeak".  The patient demonstrates clear vocal quality in conversation with 80% accuracy. She states that she is satisfied with her progress.     Assessment / Recommendations / Plan   Plan Continue with current plan of care;Discharge SLP treatment due to (comment);All goals met     Progression Toward Goals   Progression toward goals Goals met, education completed, patient discharged from [facility] - 09/24/17 1434    Education provided Yes   Education Details continue to practice as you are   Person(s) Educated Patient   Methods Explanation   Comprehension Verbalized understanding            SLP Long Term Goals - 09/24/17 1435      SLP LONG TERM GOAL #1   Title The patient will demonstrate independent understanding of vocal hygiene concepts and extrinsic laryngeal muscle stretches.     Status Achieved  SLP LONG TERM GOAL #2   Title The patient will be independent for abdominal breathing and breath support exercises.   Status Achieved     SLP LONG TERM GOAL #3   Title The patient will minimize vocal tension via resonant voice therapy (or comparable technique) with min SLP cues with 80% accuracy.   Status Achieved     SLP LONG TERM GOAL #4   Title The patient will maintain relaxed phonation / oral resonance for paragraph length recitation with 80% accuracy.   Status Achieved          Plan - Oct 17, 2017 1435    Clinical Impression Statement  Patient able to improve vocal  quality with vocal loudness and breath support to decrease laryngeal strain.  She demonstrates clear vocal quality in conversation with 80% accuracy. She states that she is satisfied with her progress and knows how to continue to improve.  Will dicontinue speech therapy at patient request.   Speech Therapy Frequency Other (comment)  Discharge   Treatment/Interventions SLP instruction and feedback;Patient/family education;Other (comment)  Voice therapy   Potential to Achieve Goals Good   Potential Considerations Ability to learn/carryover information;Co-morbidities;Cooperation/participation level;Medical prognosis;Previous level of function;Severity of impairments;Family/community support;Pain level   SLP Home Exercise Plan extrinsic laryngeal muscle stretches, breath support exercises, and resonant voice exercises   Consulted and Agree with Plan of Care Patient      Patient will benefit from skilled therapeutic intervention in order to improve the following deficits and impairments:   Dysphonia      G-Codes - 17-Oct-2017 1436    Functional Assessment Tool Used clinical judgment   Functional Limitations Voice   Voice Current Status (G9171) At least 20 percent but less than 40 percent impaired, limited or restricted   Voice Goal Status (G9172) At least 1 percent but less than 20 percent impaired, limited or restricted   Voice Discharge Status (O9735) At least 20 percent but less than 40 percent impaired, limited or restricted      Problem List Patient Active Problem List   Diagnosis Date Noted  . Hyperglycemia 08/17/2017  . Aortic atherosclerosis ([place]) 08/15/2017  . History of cerebral aneurysm  07/04/2017  . Hoarseness 07/04/2017  . Weight loss 07/04/2017  . Dysuria 08/19/2016  . Genital herpes 08/19/2016  . Amaurosis fugax 04/08/2016  . Health care maintenance 05/03/2015  . Family history of colonic polyps 01/29/2015  . Ataxia 01/07/2015  . Herpes zoster 10/06/2014  . Gastritis  10/06/2014  . Abdominal bruit 09/21/2014  . Carotid bruit 09/21/2014  . Persistent cough 04/24/2014  . Osteopenia 01/11/2013  . Rheumatoid arthritis ([place]) 12/17/2012  . Hypertension 10/11/2012  . Hypercholesteremia 10/11/2012  . Hyponatremia 10/11/2012   Leroy Sea, MS/CCC- SLP  Lou Miner 17-Oct-2017, 2:37 PM  [place] MAIN [facility] SERVICES [address] [place], [place], [address] Phone: [phone]   Fax:  [phone]   Name: Erica Little MRN: [id number] Date of Birth: 13-Apr-1946

## 2017-10-07 DIAGNOSIS — Z79899 Other long term (current) drug therapy: Secondary | ICD-10-CM | POA: Diagnosis not present

## 2017-10-07 DIAGNOSIS — M0579 Rheumatoid arthritis with rheumatoid factor of multiple sites without organ or systems involvement: Secondary | ICD-10-CM | POA: Diagnosis not present

## 2017-10-08 ENCOUNTER — Telehealth: Payer: Self-pay | Admitting: *Deleted

## 2017-10-08 ENCOUNTER — Other Ambulatory Visit: Payer: Medicare HMO

## 2017-10-08 NOTE — Addendum Note (Signed)
Addended by: Penne Lash on: 10/08/2017 10:05 AM   Modules accepted: Orders

## 2017-10-08 NOTE — Telephone Encounter (Signed)
Pt had labs drawn yesterday at [facility]. In care Everywhere, I found a CBC w/diff, Sed rate, & CMP. Pt came in today with little to no fluids & very squirmy again. Pt will return on another day to try again. Please let me know if any of the labs you ordered can be deleted or if your CMA can see if we can add-on tests to what she had drawn yesterday.

## 2017-10-08 NOTE — Telephone Encounter (Signed)
She still needs vitamin D level, lipid profile, a1c and tsh.  Thanks

## 2017-10-13 ENCOUNTER — Other Ambulatory Visit (INDEPENDENT_AMBULATORY_CARE_PROVIDER_SITE_OTHER): Payer: Medicare HMO

## 2017-10-13 DIAGNOSIS — I10 Essential (primary) hypertension: Secondary | ICD-10-CM | POA: Diagnosis not present

## 2017-10-13 DIAGNOSIS — E7800 Pure hypercholesterolemia, unspecified: Secondary | ICD-10-CM

## 2017-10-13 DIAGNOSIS — R739 Hyperglycemia, unspecified: Secondary | ICD-10-CM | POA: Diagnosis not present

## 2017-10-13 DIAGNOSIS — M8580 Other specified disorders of bone density and structure, unspecified site: Secondary | ICD-10-CM

## 2017-10-13 LAB — LIPID PANEL
CHOL/HDL RATIO: 5
Cholesterol: 174 mg/dL (ref 0–200)
HDL: 36.9 mg/dL — ABNORMAL LOW (ref >39.00)
LDL Cholesterol: 107 mg/dL — ABNORMAL HIGH (ref 0–99)
NONHDL: 136.69
Triglycerides: 150 mg/dL — ABNORMAL HIGH (ref 0.0–149.0)
VLDL: 30 mg/dL (ref 0.0–40.0)

## 2017-10-13 LAB — VITAMIN D 25 HYDROXY (VIT D DEFICIENCY, FRACTURES): VITD: 57.68 ng/mL (ref 30.00–100.00)

## 2017-10-13 LAB — TSH: TSH: 2.61 u[IU]/mL (ref 0.35–4.50)

## 2017-10-13 LAB — HEMOGLOBIN A1C: Hgb A1c MFr Bld: 5.9 % (ref 4.6–6.5)

## 2017-10-13 NOTE — Telephone Encounter (Signed)
Pt came today & specimen was collected

## 2017-10-14 ENCOUNTER — Other Ambulatory Visit: Payer: Self-pay | Admitting: Internal Medicine

## 2017-10-14 ENCOUNTER — Other Ambulatory Visit (INDEPENDENT_AMBULATORY_CARE_PROVIDER_SITE_OTHER): Payer: Medicare HMO

## 2017-10-14 DIAGNOSIS — M069 Rheumatoid arthritis, unspecified: Secondary | ICD-10-CM

## 2017-10-14 DIAGNOSIS — I10 Essential (primary) hypertension: Secondary | ICD-10-CM

## 2017-10-14 DIAGNOSIS — E871 Hypo-osmolality and hyponatremia: Secondary | ICD-10-CM

## 2017-10-14 LAB — CBC WITH DIFFERENTIAL/PLATELET
BASOS ABS: 0.1 10*3/uL (ref 0.0–0.1)
Basophils Relative: 1.2 % (ref 0.0–3.0)
EOS PCT: 11.5 % — AB (ref 0.0–5.0)
Eosinophils Absolute: 1.1 10*3/uL — ABNORMAL HIGH (ref 0.0–0.7)
HCT: 39.5 % (ref 36.0–46.0)
HEMOGLOBIN: 13 g/dL (ref 12.0–15.0)
LYMPHS ABS: 2.1 10*3/uL (ref 0.7–4.0)
LYMPHS PCT: 22.4 % (ref 12.0–46.0)
MCHC: 33 g/dL (ref 30.0–36.0)
MCV: 93.4 fl (ref 78.0–100.0)
Monocytes Absolute: 0.7 10*3/uL (ref 0.1–1.0)
Monocytes Relative: 7.5 % (ref 3.0–12.0)
NEUTROS PCT: 57.4 % (ref 43.0–77.0)
Neutro Abs: 5.4 10*3/uL (ref 1.4–7.7)
Platelets: 246 10*3/uL (ref 150.0–400.0)
RBC: 4.22 Mil/uL (ref 3.87–5.11)
RDW: 14.9 % (ref 11.5–15.5)
WBC: 9.3 10*3/uL (ref 4.0–10.5)

## 2017-10-14 LAB — BASIC METABOLIC PANEL
BUN: 12 mg/dL (ref 6–23)
CHLORIDE: 97 meq/L (ref 96–112)
CO2: 20 mEq/L (ref 19–32)
Calcium: 9.2 mg/dL (ref 8.4–10.5)
Creatinine, Ser: 0.91 mg/dL (ref 0.40–1.20)
GFR: 64.44 mL/min (ref >60.00)
Glucose, Bld: 88 mg/dL (ref 70–99)
Potassium: 4.7 mEq/L (ref 3.5–5.1)
Sodium: 130 mEq/L — ABNORMAL LOW (ref 135–145)

## 2017-10-14 LAB — HEPATIC FUNCTION PANEL
ALBUMIN: 4 g/dL (ref 3.5–5.2)
ALK PHOS: 97 U/L (ref 39–117)
ALT: 12 U/L (ref 0–35)
AST: 18 U/L (ref 0–37)
Bilirubin, Direct: 0 mg/dL (ref 0.0–0.3)
TOTAL PROTEIN: 6.6 g/dL (ref 6.0–8.3)
Total Bilirubin: 0.8 mg/dL (ref 0.2–1.2)

## 2017-10-14 NOTE — Progress Notes (Signed)
Order placed for labs.

## 2017-10-15 ENCOUNTER — Encounter: Payer: Self-pay | Admitting: Family

## 2017-10-15 ENCOUNTER — Ambulatory Visit (INDEPENDENT_AMBULATORY_CARE_PROVIDER_SITE_OTHER): Payer: Medicare HMO | Admitting: Family

## 2017-10-15 VITALS — BP 130/78 | HR 69 | Temp 98.7°F | Resp 18 | Wt 110.4 lb

## 2017-10-15 DIAGNOSIS — R053 Chronic cough: Secondary | ICD-10-CM

## 2017-10-15 DIAGNOSIS — R05 Cough: Secondary | ICD-10-CM

## 2017-10-15 MED ORDER — DOXYCYCLINE HYCLATE 100 MG PO TABS: 100.0000 mg | ORAL_TABLET | Freq: Two times a day (BID) | ORAL | 0 refills | Status: AC

## 2017-10-15 NOTE — Progress Notes (Signed)
Subjective:    Patient ID: Erica Little, female    DOB: Aug 20, 1946, 71 y.o.   MRN: [id number]  CC: Erica Little is a 71 y.o. female who presents today for an acute visit.    HPI: CC: productive cough, congestion x one week, unchanged.   'really feels well ,just wants to be ahead of it.'   No antibitoics in last 3 months  NO fever, chills, ha, ear pain, sinus pressure.   Not using inhaler.  'im breathing fine.'   Tried cloricidin with no relief.    Former smoker; states 'chronic bronchitis'.   No cp, sob, wheezing.   Appt with pulmonologist in November.      CXR 07/2017- chronic diffuse thickening is stable HISTORY:  Past Medical History:  Diagnosis Date  . Family history of brain aneurysm   . GERD (gastroesophageal reflux disease)   . Hyperlipidemia   . Hypertension   . MRSA (methicillin resistant Staphylococcus aureus)    skin infections  . Rheumatoid arthritis(714.0)    transaminitis on MTX, remicade rxn, s/p sulfasalazine, orencia  . Ulcer disease    Past Surgical History:  Procedure Laterality Date  . CHOLECYSTECTOMY  2010  . INTRACRANIAL ANEURYSM REPAIR  05/29/2016  . MCP replacements  2001   left  . TUBAL LIGATION     Family History  Problem Relation Age of Onset  . Heart disease Mother   . Diabetes Mother   . Hypercholesterolemia Mother   . Heart disease Father   . Heart disease Brother        MI age 55  . Hypercholesterolemia Sister   . Colon polyps Sister   . Arthritis/Rheumatoid Paternal Aunt   . Arthritis/Rheumatoid Paternal Uncle   . Breast cancer Neg Hx     Allergies: Oxycodone; Remicade [infliximab]; Simvastatin; Sulfa antibiotics; Zetia [ezetimibe]; Latex; and Tape Current Outpatient Prescriptions on File Prior to Visit  Medication Sig Dispense Refill  . abatacept (ORENCIA) 250 MG injection Once every month    . albuterol (PROVENTIL HFA;VENTOLIN HFA) 108 (90 BASE) MCG/ACT inhaler Inhale 2 puffs into the lungs every 6 (six) hours  as needed for wheezing or shortness of breath. 1 Inhaler 2  . amLODipine (NORVASC) 2.5 MG tablet TAKE 1 TABLET (2.5 MG TOTAL) BY MOUTH DAILY. 90 tablet 1  . aspirin 81 MG chewable tablet Chew 81 mg by mouth daily.    . calcium gluconate 650 MG tablet Take 650 mg by mouth daily.    . cholecalciferol (VITAMIN D) 1000 UNITS tablet Take 1,000 Units by mouth daily.    . dabigatran (PRADAXA) 75 MG CAPS capsule Take 75 mg by mouth 2 (two) times daily.    . fluticasone (FLONASE) 50 MCG/ACT nasal spray Place 2 sprays into the nose daily as needed.    Marland Kitchen losartan (COZAAR) 100 MG tablet TAKE 1 TABLET (100 MG TOTAL) BY MOUTH DAILY. 90 tablet 1  . lovastatin (MEVACOR) 10 MG tablet Take 1 tablet (10 mg total) by mouth at bedtime. 30 tablet 5  . magnesium oxide (MAG-OX) 400 MG tablet Take 400 mg by mouth daily.    . methotrexate (RHEUMATREX) 2.5 MG tablet Take 2.5 mg by mouth once a week.     . pantoprazole (PROTONIX) 40 MG tablet Take 1 tablet (40 mg total) by mouth daily. 30 tablet 2  . valACYclovir (VALTREX) 1000 MG tablet Take 1 tablet (1,000 mg total) by mouth 2 (two) times daily. 20 tablet 0   No current facility-administered  medications on file prior to visit.     Social History  Substance Use Topics  . Smoking status: Former Smoker    Quit date: 12/30/1988  . Smokeless tobacco: Never Used  . Alcohol use No    Review of Systems  Constitutional: Negative for chills and fever.  HENT: Positive for congestion.   Respiratory: Positive for cough.   Cardiovascular: Negative for chest pain and palpitations.  Gastrointestinal: Negative for nausea and vomiting.      Objective:    BP 130/78 (BP Location: Left Arm, Patient Position: Sitting, Cuff Size: Normal)   Pulse 69   Temp 98.7 F (37.1 C) (Oral)   Resp 18   Wt 110 lb 6 oz (50.1 kg)   SpO2 98%   BMI 22.29 kg/m    Physical Exam  Constitutional: She appears well-developed and well-nourished.  HENT:  Head: Normocephalic and atraumatic.    Right Ear: Hearing, tympanic membrane, external ear and ear canal normal. No drainage, swelling or tenderness. No foreign bodies. Tympanic membrane is not erythematous and not bulging. No middle ear effusion. No decreased hearing is noted.  Left Ear: Hearing, tympanic membrane, external ear and ear canal normal. No drainage, swelling or tenderness. No foreign bodies. Tympanic membrane is not erythematous and not bulging.  No middle ear effusion. No decreased hearing is noted.  Nose: Nose normal. No rhinorrhea. Right sinus exhibits no maxillary sinus tenderness and no frontal sinus tenderness. Left sinus exhibits no maxillary sinus tenderness and no frontal sinus tenderness.  Mouth/Throat: Uvula is midline, oropharynx is clear and moist and mucous membranes are normal. No oropharyngeal exudate, posterior oropharyngeal edema, posterior oropharyngeal erythema or tonsillar abscesses.  Eyes: Conjunctivae are normal.  Cardiovascular: Regular rhythm, normal heart sounds and normal pulses.   Pulmonary/Chest: Effort normal and breath sounds normal. She has no wheezes. She has no rhonchi. She has no rales.  Lymphadenopathy:       Head (right side): No submental, no submandibular, no tonsillar, no preauricular, no posterior auricular and no occipital adenopathy present.       Head (left side): No submental, no submandibular, no tonsillar, no preauricular, no posterior auricular and no occipital adenopathy present.    She has no cervical adenopathy.  Neurological: She is alert.  Skin: Skin is warm and dry.  Psychiatric: She has a normal mood and affect. Her speech is normal and behavior is normal. Thought content normal.  Vitals reviewed.      Assessment & Plan:      I am having Ms. Molinari start on doxycycline. I am also having her maintain her abatacept, calcium gluconate, cholecalciferol, fluticasone, methotrexate, magnesium oxide, albuterol, aspirin, dabigatran, valACYclovir, lovastatin, losartan,  amLODipine, and pantoprazole.   Meds ordered this encounter  Medications  . doxycycline (VIBRA-TABS) 100 MG tablet    Sig: Take 1 tablet (100 mg total) by mouth 2 (two) times daily.    Dispense:  14 tablet    Refill:  0    Order Specific Question:   Supervising Provider    Answer:   Sherlene Shams [[id number]]    Return precautions given.   Risks, benefits, and alternatives of the medications and treatment plan prescribed today were discussed, and patient expressed understanding.   Education regarding symptom management and diagnosis given to patient on AVS.  Continue to follow with Dale [place]ahoma, MD for routine health maintenance.   Erica Little and I agreed with plan.   Rennie Plowman, FNP

## 2017-10-15 NOTE — Patient Instructions (Signed)
Start doxycycline  Ensure to take probiotics while on antibiotics and also for 2 weeks after completion. It is important to re-colonize the gut with good bacteria and also to prevent any diarrheal infections associated with antibiotic use.   Plenty of water  Let us know if not feeling better   Acute Bronchitis, Adult Acute bronchitis is sudden (acute) swelling of the air tubes (bronchi) in the lungs. Acute bronchitis causes these tubes to fill with mucus, which can make it hard to breathe. It can also cause coughing or wheezing. In adults, acute bronchitis usually goes away within 2 weeks. A cough caused by bronchitis may last up to 3 weeks. Smoking, allergies, and asthma can make the condition worse. Repeated episodes of bronchitis may cause further lung problems, such as chronic obstructive pulmonary disease (COPD). What are the causes? This condition can be caused by germs and by substances that irritate the lungs, including:  Cold and flu viruses. This condition is most often caused by the same virus that causes a cold.  Bacteria.  Exposure to tobacco smoke, dust, fumes, and air pollution.  What increases the risk? This condition is more likely to develop in people who:  Have close contact with someone with acute bronchitis.  Are exposed to lung irritants, such as tobacco smoke, dust, fumes, and vapors.  Have a weak immune system.  Have a respiratory condition such as asthma.  What are the signs or symptoms? Symptoms of this condition include:  A cough.  Coughing up clear, yellow, or green mucus.  Wheezing.  Chest congestion.  Shortness of breath.  A fever.  Body aches.  Chills.  A sore throat.  How is this diagnosed? This condition is usually diagnosed with a physical exam. During the exam, your health care provider may order tests, such as chest X-rays, to rule out other conditions. He or she may also:  Test a sample of your mucus for bacterial  infection.  Check the level of oxygen in your blood. This is done to check for pneumonia.  Do a chest X-ray or lung function testing to rule out pneumonia and other conditions.  Perform blood tests.  Your health care provider will also ask about your symptoms and medical history. How is this treated? Most cases of acute bronchitis clear up over time without treatment. Your health care provider may recommend:  Drinking more fluids. Drinking more makes your mucus thinner, which may make it easier to breathe.  Taking a medicine for a fever or cough.  Taking an antibiotic medicine.  Using an inhaler to help improve shortness of breath and to control a cough.  Using a cool mist vaporizer or humidifier to make it easier to breathe.  Follow these instructions at home: Medicines  Take over-the-counter and prescription medicines only as told by your health care provider.  If you were prescribed an antibiotic, take it as told by your health care provider. Do not stop taking the antibiotic even if you start to feel better. General instructions  Get plenty of rest.  Drink enough fluids to keep your urine clear or pale yellow.  Avoid smoking and secondhand smoke. Exposure to cigarette smoke or irritating chemicals will make bronchitis worse. If you smoke and you need help quitting, ask your health care provider. Quitting smoking will help your lungs heal faster.  Use an inhaler, cool mist vaporizer, or humidifier as told by your health care provider.  Keep all follow-up visits as told by your health care provider.  This is important. How is this prevented? To lower your risk of getting this condition again:  Wash your hands often with soap and water. If soap and water are not available, use hand sanitizer.  Avoid contact with people who have cold symptoms.  Try not to touch your hands to your mouth, nose, or eyes.  Make sure to get the flu shot every year.  Contact a health care  provider if:  Your symptoms do not improve in 2 weeks of treatment. Get help right away if:  You cough up blood.  You have chest pain.  You have severe shortness of breath.  You become dehydrated.  You faint or keep feeling like you are going to faint.  You keep vomiting.  You have a severe headache.  Your fever or chills gets worse. This information is not intended to replace advice given to you by your health care provider. Make sure you discuss any questions you have with your health care provider. Document Released: 01/23/2005 Document Revised: 07/10/2016 Document Reviewed: 06/05/2016 Elsevier Interactive Patient Education  2017 [employer].

## 2017-10-15 NOTE — Assessment & Plan Note (Addendum)
Afebrile and patient is well-appearing today. Reviewed recent chest x-ray. She is pending oncology appointment. Declines cough medication. We jointly agreed to start antibiotic therapy due to history of smoking, fragility. Advise very close vigilance.

## 2017-10-16 ENCOUNTER — Ambulatory Visit (INDEPENDENT_AMBULATORY_CARE_PROVIDER_SITE_OTHER): Payer: Medicare HMO | Admitting: Internal Medicine

## 2017-10-16 ENCOUNTER — Encounter: Payer: Self-pay | Admitting: Internal Medicine

## 2017-10-16 VITALS — BP 130/74 | HR 77 | Temp 98.2°F | Resp 16 | Ht 59.06 in | Wt 111.4 lb

## 2017-10-16 DIAGNOSIS — M069 Rheumatoid arthritis, unspecified: Secondary | ICD-10-CM

## 2017-10-16 DIAGNOSIS — R05 Cough: Secondary | ICD-10-CM | POA: Diagnosis not present

## 2017-10-16 DIAGNOSIS — Z9889 Other specified postprocedural states: Secondary | ICD-10-CM | POA: Diagnosis not present

## 2017-10-16 DIAGNOSIS — Z8679 Personal history of other diseases of the circulatory system: Secondary | ICD-10-CM

## 2017-10-16 DIAGNOSIS — R634 Abnormal weight loss: Secondary | ICD-10-CM

## 2017-10-16 DIAGNOSIS — I700 Atherosclerosis of aorta: Secondary | ICD-10-CM | POA: Diagnosis not present

## 2017-10-16 DIAGNOSIS — R053 Chronic cough: Secondary | ICD-10-CM

## 2017-10-16 DIAGNOSIS — I10 Essential (primary) hypertension: Secondary | ICD-10-CM | POA: Diagnosis not present

## 2017-10-16 DIAGNOSIS — E871 Hypo-osmolality and hyponatremia: Secondary | ICD-10-CM | POA: Diagnosis not present

## 2017-10-16 DIAGNOSIS — E7800 Pure hypercholesterolemia, unspecified: Secondary | ICD-10-CM | POA: Diagnosis not present

## 2017-10-16 DIAGNOSIS — D721 Eosinophilia, unspecified: Secondary | ICD-10-CM

## 2017-10-16 DIAGNOSIS — R0989 Other specified symptoms and signs involving the circulatory and respiratory systems: Secondary | ICD-10-CM | POA: Diagnosis not present

## 2017-10-16 DIAGNOSIS — R739 Hyperglycemia, unspecified: Secondary | ICD-10-CM

## 2017-10-16 NOTE — Progress Notes (Signed)
Patient ID: AMERAH PULEO, female   DOB: 12/16/1946, 71 y.o.   MRN: [id number]   Subjective:    Patient ID: Rosana Fret, female    DOB: [birth year], 71 y.o.   MRN: [id number]  HPI  Patient here for a scheduled follow up.  She reports persistent cough.  She has seen pulmonary previously.  Felt to be related to chronic bronchitis and concern over interstitial lung disease related to RA.  Worsened recently.  Was evaluated yesterday.  Placed on doxycycline for acute infection.  Does feel better.  No sob.  No chest pain.  Eating.  No nausea or vomiting.  No acid reflux.  No abdominal pain.  Bowels moving.  No urine change.     Past Medical History:  Diagnosis Date  . Family history of brain aneurysm   . GERD (gastroesophageal reflux disease)   . Hyperlipidemia   . Hypertension   . MRSA (methicillin resistant Staphylococcus aureus)    skin infections  . Rheumatoid arthritis(714.0)    transaminitis on MTX, remicade rxn, s/p sulfasalazine, orencia  . Ulcer disease    Past Surgical History:  Procedure Laterality Date  . CHOLECYSTECTOMY  2010  . INTRACRANIAL ANEURYSM REPAIR  05/29/2016  . MCP replacements  2001   left  . TUBAL LIGATION     Family History  Problem Relation Age of Onset  . Heart disease Mother   . Diabetes Mother   . Hypercholesterolemia Mother   . Heart disease Father   . Heart disease Brother        MI age 17  . Hypercholesterolemia Sister   . Colon polyps Sister   . Arthritis/Rheumatoid Paternal Aunt   . Arthritis/Rheumatoid Paternal Uncle   . Breast cancer Neg Hx    Social History   Social History  . Marital status: Widowed    Spouse name: N/A  . Number of children: 1  . Years of education: N/A   Social History Main Topics  . Smoking status: Former Smoker    Quit date: 12/30/1988  . Smokeless tobacco: Never Used  . Alcohol use No  . Drug use: No  . Sexual activity: Not Asked   Other Topics Concern  . None   Social History Narrative  . None     Outpatient Encounter Prescriptions as of 10/16/2017  Medication Sig  . abatacept (ORENCIA) 250 MG injection Once every month  . albuterol (PROVENTIL HFA;VENTOLIN HFA) 108 (90 BASE) MCG/ACT inhaler Inhale 2 puffs into the lungs every 6 (six) hours as needed for wheezing or shortness of breath.  Marland Kitchen amLODipine (NORVASC) 2.5 MG tablet TAKE 1 TABLET (2.5 MG TOTAL) BY MOUTH DAILY.  Marland Kitchen aspirin 81 MG chewable tablet Chew 81 mg by mouth daily.  . calcium gluconate 650 MG tablet Take 650 mg by mouth daily.  . cholecalciferol (VITAMIN D) 1000 UNITS tablet Take 1,000 Units by mouth daily.  . dabigatran (PRADAXA) 75 MG CAPS capsule Take 75 mg by mouth 2 (two) times daily.  Marland Kitchen doxycycline (VIBRA-TABS) 100 MG tablet Take 1 tablet (100 mg total) by mouth 2 (two) times daily.  . fluticasone (FLONASE) 50 MCG/ACT nasal spray Place 2 sprays into the nose daily as needed.  Marland Kitchen losartan (COZAAR) 100 MG tablet TAKE 1 TABLET (100 MG TOTAL) BY MOUTH DAILY.  Marland Kitchen lovastatin (MEVACOR) 10 MG tablet Take 1 tablet (10 mg total) by mouth at bedtime.  . magnesium oxide (MAG-OX) 400 MG tablet Take 400 mg by mouth daily.  . methotrexate (  RHEUMATREX) 2.5 MG tablet Take 2.5 mg by mouth once a week.   . pantoprazole (PROTONIX) 40 MG tablet Take 1 tablet (40 mg total) by mouth daily.  . valACYclovir (VALTREX) 1000 MG tablet Take 1 tablet (1,000 mg total) by mouth 2 (two) times daily.   No facility-administered encounter medications on file as of 10/16/2017.     Review of Systems  Constitutional: Negative for appetite change and unexpected weight change.  HENT: Positive for congestion. Negative for sinus pressure.   Respiratory: Positive for cough. Negative for chest tightness, shortness of breath and wheezing.        Increased congestion, productive green mucus.    Cardiovascular: Negative for chest pain, palpitations and leg swelling.  Gastrointestinal: Negative for abdominal pain, diarrhea, nausea and vomiting.   Genitourinary: Negative for difficulty urinating and dysuria.  Musculoskeletal: Positive for joint swelling. Negative for myalgias.  Skin: Negative for color change and rash.  Neurological: Negative for dizziness, light-headedness and headaches.  Psychiatric/Behavioral: Negative for agitation and dysphoric mood.       Objective:    Physical Exam  Constitutional: She appears well-developed and well-nourished. No distress.  HENT:  Nose: Nose normal.  Mouth/Throat: Oropharynx is clear and moist.  Neck: Neck supple. No thyromegaly present.  Cardiovascular: Normal rate and regular rhythm.   Pulmonary/Chest: Breath sounds normal. No respiratory distress. She has no wheezes.  Abdominal: Soft. Bowel sounds are normal. There is no tenderness.  Musculoskeletal: She exhibits no edema or tenderness.  Lymphadenopathy:    She has no cervical adenopathy.  Skin: No rash noted. No erythema.  Psychiatric: She has a normal mood and affect. Her behavior is normal.    BP 130/74 (BP Location: Left Arm, Patient Position: Sitting, Cuff Size: Normal)   Pulse 77   Temp 98.2 F (36.8 C) (Oral)   Resp 16   Ht 4' 11.06" (1.5 m)   Wt 111 lb 6.4 oz (50.5 kg)   SpO2 95%   BMI 22.46 kg/m  Wt Readings from Last 3 Encounters:  10/16/17 111 lb 6.4 oz (50.5 kg)  10/15/17 110 lb 6 oz (50.1 kg)  08/26/17 113 lb 3.2 oz (51.3 kg)     Lab Results  Component Value Date   WBC 9.3 10/14/2017   HGB 13.0 10/14/2017   HCT 39.5 10/14/2017   PLT 246.0 10/14/2017   GLUCOSE 88 10/14/2017   CHOL 174 10/13/2017   TRIG 150.0 (H) 10/13/2017   HDL 36.90 (L) 10/13/2017   LDLDIRECT 167.8 12/20/2013   LDLCALC 107 (H) 10/13/2017   ALT 12 10/14/2017   AST 18 10/14/2017   NA 130 (L) 10/14/2017   K 4.7 10/14/2017   CL 97 10/14/2017   CREATININE 0.91 10/14/2017   BUN 12 10/14/2017   CO2 20 10/14/2017   TSH 2.61 10/13/2017   INR 1.1 (H) 12/06/2013   HGBA1C 5.9 10/13/2017    Mm Screening Breast Tomo  Bilateral  Result Date: 05/28/2017 CLINICAL DATA:  Screening. EXAM: 2D DIGITAL SCREENING BILATERAL MAMMOGRAM WITH CAD AND ADJUNCT TOMO COMPARISON:  Previous exam(s). ACR Breast Density Category b: There are scattered areas of fibroglandular density. FINDINGS: There are no findings suspicious for malignancy. Images were processed with CAD. IMPRESSION: No mammographic evidence of malignancy. A result letter of this screening mammogram will be mailed directly to the patient. RECOMMENDATION: Screening mammogram in one year. (Code:SM-B-01Y) BI-RADS CATEGORY  1: Negative. Electronically Signed   By: Edwin Cap M.D.   On: 05/28/2017 14:22  Assessment & Plan:   Problem List Items Addressed This Visit    Aortic atherosclerosis (HCC)    On mevacor.        Carotid bruit    Has been followed by [facility].        History of cerebral aneurysm     S/p stenting.  Being followed at [facility].  Recommended f/u MRA in one year.  On plavix.  Recommended continuing lifelong aspirin.        Hypercholesteremia    On mevacor.  Discussed increasing dose.  She declines.  Follow lipid panel and liver function tests.        Relevant Orders   Hepatic function panel   Lipid panel   Hyperglycemia    Low carb diet and exercise.  Follow metabolic panel and a1c.        Relevant Orders   Hemoglobin A1c   Hypertension    Blood pressure as outlined.  Same medication regimen.  Have her spot check her pressure.  Follow metabolic panel.        Relevant Orders   Basic metabolic panel   Hyponatremia    Sodium recently checked stable.  Follow.        Relevant Orders   Basic metabolic panel   Persistent cough    Started on abx yesterday with worsening cough and congestion.  Has underlying chronic issue and concern regarding chronic bronchitis and interstitial lung disease secondary to RA.  Seeing pulmonary.  Feeling better.        Rheumatoid arthritis (HCC)    On MTX and orencia.   Followed by Dr Gavin Potters.        Weight loss    Weight stable from most recent check but decreased a couple of pounds from check prior.  States she is eating.  Follow.         Other Visit Diagnoses    Eosinophilia    -  Primary   Relevant Orders   CBC with Differential/Platelet       Dale Concord, MD

## 2017-10-19 ENCOUNTER — Encounter: Payer: Self-pay | Admitting: Internal Medicine

## 2017-10-19 NOTE — Assessment & Plan Note (Signed)
On MTX and orencia.  Followed by Dr Kernodle.  

## 2017-10-19 NOTE — Assessment & Plan Note (Signed)
Sodium recently checked stable.  Follow.

## 2017-10-19 NOTE — Assessment & Plan Note (Signed)
S/p stenting.  Being followed at [facility].  Recommended f/u MRA in one year.  On plavix.  Recommended continuing lifelong aspirin.

## 2017-10-19 NOTE — Assessment & Plan Note (Signed)
On mevacor.  Discussed increasing dose.  She declines.  Follow lipid panel and liver function tests.

## 2017-10-19 NOTE — Assessment & Plan Note (Signed)
Has been followed by [facility].   °

## 2017-10-19 NOTE — Assessment & Plan Note (Signed)
Blood pressure as outlined.  Same medication regimen.  Have her spot check her pressure.  Follow metabolic panel.

## 2017-10-19 NOTE — Assessment & Plan Note (Signed)
Started on abx yesterday with worsening cough and congestion.  Has underlying chronic issue and concern regarding chronic bronchitis and interstitial lung disease secondary to RA.  Seeing pulmonary.  Feeling better.

## 2017-10-19 NOTE — Assessment & Plan Note (Addendum)
Low carb diet and exercise.  Follow metabolic panel and a1c.   

## 2017-10-19 NOTE — Assessment & Plan Note (Signed)
On mevacor.  

## 2017-10-19 NOTE — Assessment & Plan Note (Signed)
Weight stable from most recent check but decreased a couple of pounds from check prior.  States she is eating.  Follow.

## 2017-11-04 DIAGNOSIS — M0579 Rheumatoid arthritis with rheumatoid factor of multiple sites without organ or systems involvement: Secondary | ICD-10-CM | POA: Diagnosis not present

## 2017-11-06 ENCOUNTER — Other Ambulatory Visit: Payer: Self-pay | Admitting: Internal Medicine

## 2017-11-06 ENCOUNTER — Ambulatory Visit (INDEPENDENT_AMBULATORY_CARE_PROVIDER_SITE_OTHER): Payer: Medicare HMO

## 2017-11-06 VITALS — BP 118/64 | HR 74 | Temp 97.9°F | Resp 15 | Ht 59.0 in | Wt 111.8 lb

## 2017-11-06 DIAGNOSIS — Z0000 Encounter for general adult medical examination without abnormal findings: Secondary | ICD-10-CM | POA: Diagnosis not present

## 2017-11-06 NOTE — Progress Notes (Signed)
Subjective:   Erica Little is a 71 y.o. female who presents for an Initial Medicare Annual Wellness Visit.  Review of Systems    No ROS.  Medicare Wellness Visit. Additional risk factors are reflected in the social history.  Cardiac Risk Factors include: advanced age (>77men, >57 women);hypertension     Objective:    Today's Vitals   11/06/17 1100  BP: 118/64  Pulse: 74  Resp: 15  Temp: 97.9 F (36.6 C)  TempSrc: Oral  SpO2: 99%  Weight: 111 lb 12.8 oz (50.7 kg)  Height: 4\' 11"  (1.499 m)   Body mass index is 22.58 kg/m.   Current Medications (verified) Outpatient Encounter Medications as of 11/06/2017  Medication Sig  . abatacept (ORENCIA) 250 MG injection Once every month  . albuterol (PROVENTIL HFA;VENTOLIN HFA) 108 (90 BASE) MCG/ACT inhaler Inhale 2 puffs into the lungs every 6 (six) hours as needed for wheezing or shortness of breath.  13/07/2017 amLODipine (NORVASC) 2.5 MG tablet TAKE 1 TABLET (2.5 MG TOTAL) BY MOUTH DAILY.  Marland Kitchen aspirin 81 MG chewable tablet Chew 81 mg by mouth daily.  . calcium gluconate 650 MG tablet Take 650 mg by mouth daily.  . cholecalciferol (VITAMIN D) 1000 UNITS tablet Take 1,000 Units by mouth daily.  . dabigatran (PRADAXA) 75 MG CAPS capsule Take 75 mg by mouth 2 (two) times daily.  . fluticasone (FLONASE) 50 MCG/ACT nasal spray Place 2 sprays into the nose daily as needed.  Marland Kitchen losartan (COZAAR) 100 MG tablet TAKE 1 TABLET (100 MG TOTAL) BY MOUTH DAILY.  Marland Kitchen lovastatin (MEVACOR) 10 MG tablet Take 1 tablet (10 mg total) by mouth at bedtime.  . magnesium oxide (MAG-OX) 400 MG tablet Take 400 mg by mouth daily.  . methotrexate (RHEUMATREX) 2.5 MG tablet Take 2.5 mg by mouth once a week.   . pantoprazole (PROTONIX) 40 MG tablet TAKE 1 TABLET BY MOUTH EVERY DAY  . valACYclovir (VALTREX) 1000 MG tablet Take 1 tablet (1,000 mg total) by mouth 2 (two) times daily.   No facility-administered encounter medications on file as of 11/06/2017.     Allergies  (verified) Oxycodone; Remicade [infliximab]; Simvastatin; Sulfa antibiotics; Zetia [ezetimibe]; Latex; and Tape   History: Past Medical History:  Diagnosis Date  . Family history of brain aneurysm   . GERD (gastroesophageal reflux disease)   . Hyperlipidemia   . Hypertension   . MRSA (methicillin resistant Staphylococcus aureus)    skin infections  . Rheumatoid arthritis(714.0)    transaminitis on MTX, remicade rxn, s/p sulfasalazine, orencia  . Ulcer disease    Past Surgical History:  Procedure Laterality Date  . CHOLECYSTECTOMY  2010  . INTRACRANIAL ANEURYSM REPAIR  05/29/2016  . MCP replacements  2001   left  . TUBAL LIGATION     Family History  Problem Relation Age of Onset  . Heart disease Mother   . Diabetes Mother   . Hypercholesterolemia Mother   . Heart disease Father   . Heart disease Brother        MI age 29  . Hypercholesterolemia Sister   . Cancer Sister   . Colon polyps Sister   . Arthritis/Rheumatoid Paternal Aunt   . Arthritis/Rheumatoid Paternal Uncle   . Breast cancer Neg Hx    Social History   Occupational History  . Not on file  Tobacco Use  . Smoking status: Former Smoker    Last attempt to quit: 12/30/1988    Years since quitting: 28.8  . Smokeless  tobacco: Never Used  Substance and Sexual Activity  . Alcohol use: No    Alcohol/week: 0.0 oz  . Drug use: No  . Sexual activity: Yes    Tobacco Counseling Counseling given: Not Answered   Activities of Daily Living In your present state of health, do you have any difficulty performing the following activities: 11/06/2017  Hearing? N  Vision? N  Difficulty concentrating or making decisions? N  Walking or climbing stairs? Y  Comment Unsteady gait  Dressing or bathing? N  Doing errands, shopping? N  Preparing Food and eating ? N  Using the Toilet? N  In the past six months, have you accidently leaked urine? N  Do you have problems with loss of bowel control? N  Managing your  Medications? N  Managing your Finances? N  Housekeeping or managing your Housekeeping? N  Some recent data might be hidden    Immunizations and Health Maintenance  There is no immunization history on file for this patient. Health Maintenance Due  Topic Date Due  . Hepatitis C Screening  09/10/1946    Patient Care Team: Dale Altoona, MD as PCP - General (Internal Medicine)  Indicate any recent Medical Services you may have received from other than [facility] providers in the past year (date may be approximate).     Assessment:   This is a routine wellness examination for Genessy.  The goal of the wellness visit is to assist the patient how to close the gaps in care and create a preventative care plan for the patient.   The roster of all physicians providing medical care to patient is listed in the Snapshot section of the chart.  Taking calcium VIT D as appropriate/Osteoporosis risk reviewed.    Safety issues reviewed; Smoke and carbon monoxide detectors in the home. No firearms in the home.  Wears seatbelts when driving or riding with others. Patient does wear sunscreen or protective clothing when in direct sunlight. No violence in the home.  Patient is alert, normal appearance, oriented to person/place/and time. Correctly identified the president of the [place], recall of 3/3 words, and performing simple calculations. Displays appropriate judgement and can read correct time from watch face.   No new identified risk were noted.  No failures at ADL's or IADL's.    BMI- discussed the importance of a healthy diet, water intake and the benefits of aerobic exercise. Educational material provided.   24 hour diet recall: Breakfast: 1 slice of bacon, egg, toast Lunch: none Dinner: chicken, salad Snack: apple Daily fluid intake: 4-12 cups of caffeine, 1-2 cups of water  Dental- every 6 months.  Eye- Visual acuity not assessed per patient preference since they have regular follow up with  the ophthalmologist.    Sleep patterns- Sleeps 10-12 hours at night.  Wakes feeling rested.  Hepatitis C Screening discussed.  Educational material provided.  Patient Concerns: None at this time. Follow up with PCP as needed.  Hearing/Vision screen Hearing Screening Comments: Patient is able to hear conversational tones without difficulty.  No issues reported.   Vision Screening Comments: Followed by [facility] Last OV 05/2017 Visual acuity not assessed per patient preference since they have regular follow up with the ophthalmologist  Dietary issues and exercise activities discussed: Current Exercise Habits: Home exercise routine, Type of exercise: strength training/weights;stretching;walking(dance), Frequency (Times/Week): 7, Intensity: Mild   Depression Screen PHQ 2/9 Scores 11/06/2017 12/05/2016 05/08/2015 04/22/2014 05/14/2013 01/06/2013 10/31/2012  PHQ - 2 Score 0 0 0 0 0 2  0  PHQ- 9 Score - - - - - 2 -    Fall Risk Fall Risk  11/06/2017 12/05/2016 05/08/2015 04/22/2014 05/14/2013  Falls in the past year? No No No No No    Cognitive Function: MMSE - Mini Mental State Exam 11/06/2017  Orientation to time 5  Orientation to Place 5  Registration 3  Attention/ Calculation 5  Recall 3  Language- name 2 objects 2  Language- repeat 1  Language- follow 3 step command 3  Language- read & follow direction 1  Write a sentence 1  Copy design 1  Total score 30        Screening Tests Health Maintenance  Topic Date Due  . Hepatitis C Screening  [birth year]  . INFLUENZA VACCINE  02/24/2018 (Originally 07/30/2017)  . TETANUS/TDAP  05/07/2020 (Originally 12/21/1963)  . PNA vac Low Risk Adult (1 of 2 - PCV13) 05/07/2020 (Originally 12/20/2009)  . MAMMOGRAM  05/28/2018  . COLONOSCOPY  09/24/2019  . DEXA SCAN  Completed      Plan:    End of life planning; Advance aging; Advanced directives discussed. Copy of current HCPOA/Living Will requested.    I have personally reviewed  and noted the following in the patient's chart:   . Medical and social history . Use of alcohol, tobacco or illicit drugs  . Current medications and supplements . Functional ability and status . Nutritional status . Physical activity . Advanced directives . List of other physicians . Hospitalizations, surgeries, and ER visits in previous 12 months . Vitals . Screenings to include cognitive, depression, and falls . Referrals and appointments  In addition, I have reviewed and discussed with patient certain preventive protocols, quality metrics, and best practice recommendations. A written personalized care plan for preventive services as well as general preventive health recommendations were provided to patient.     Ashok Pall, LPN   16/12/958    Reviewed above information.  Agree with assessment and plan.    Dr Lorin Picket

## 2017-11-06 NOTE — Patient Instructions (Addendum)
  Erica Little , Thank you for taking time to come for your Medicare Wellness Visit. I appreciate your ongoing commitment to your health goals. Please review the following plan we discussed and let me know if I can assist you in the future.   Follow up with Dr. Lorin Picket as needed.    Bring a copy of your Health Care Power of Attorney and/or Living Will to be scanned into chart.  Have a great day!  These are the goals we discussed:  Increase water intake; decrease caffeine intake.   This is a list of the screening recommended for you and due dates:  Health Maintenance  Topic Date Due  .  Hepatitis C: One time screening is recommended by Center for Disease Control  (CDC) for  adults born from [birth year] through 1967.   05-Aug-1946  . Flu Shot  02/24/2018*  . Tetanus Vaccine  05/07/2020*  . Pneumonia vaccines (1 of 2 - PCV13) 05/07/2020*  . Mammogram  05/28/2018  . Colon Cancer Screening  09/24/2019  . DEXA scan (bone density measurement)  Completed  *Topic was postponed. The date shown is not the original due date.

## 2017-11-18 ENCOUNTER — Ambulatory Visit
Admission: RE | Admit: 2017-11-18 | Discharge: 2017-11-18 | Disposition: A | Discharge status: Home / Self Care | Payer: Medicare HMO | Source: Ambulatory Visit | Attending: Internal Medicine | Admitting: Internal Medicine

## 2017-11-18 ENCOUNTER — Ambulatory Visit (HOSPITAL_COMMUNITY): Payer: Medicare HMO

## 2017-11-18 ENCOUNTER — Ambulatory Visit: Payer: Medicare HMO

## 2017-11-18 DIAGNOSIS — J849 Interstitial pulmonary disease, unspecified: Secondary | ICD-10-CM | POA: Diagnosis not present

## 2017-11-18 DIAGNOSIS — I700 Atherosclerosis of aorta: Secondary | ICD-10-CM | POA: Diagnosis not present

## 2017-11-18 DIAGNOSIS — I2510 Atherosclerotic heart disease of native coronary artery without angina pectoris: Secondary | ICD-10-CM | POA: Diagnosis not present

## 2017-11-18 DIAGNOSIS — R918 Other nonspecific abnormal finding of lung field: Secondary | ICD-10-CM | POA: Diagnosis not present

## 2017-11-19 NOTE — Progress Notes (Signed)
[facility] Consultation      Assessment and Plan:  Chronic cough. -This is been present for approximately 5 years, has not responded to typical empiric therapies during that time. -Discussed with patient this is likely related to vocal cord nodules, she has chronic hoarseness of voice.  Also discussed that this may be secondary to underlying chronic bronchitis, we discussed the possibility of starting an anticholinergic inhaler to help treat her symptoms, she would like to hold off at this time. She feels the cough is not very problematic for her. We can revisit this if she changes her mind.  Lung nodules. -Most are 4 mm or smaller, there is one groundglass nodule up to 8 mm in diameter.  These appear to be low risk for cancer, particularly given her history of rheumatoid arthritis. -We will get CT chest without contrast in 6 months time, follow-up after that.  Rheumatoid arthritis. -Chronic interstitial changes seen on chest x-ray, however this is not seen on high resolution CT chest or appreciated on physical examination.  COPD/emphysema -Confirmed on pulmonary function testing, however this appears to be very mild with an FEV1 of 100%, and no daily symptoms. --Prevnar 1311/26/18. -Declined flu vaccine.   Date: 11/19/2017  MRN# [id number] Erica Little 1946-01-30    Erica Little is a 71 y.o. old female seen in consultation for chief complaint of:    Chief Complaint  Patient presents with  . interstitual lung disease    Pt here for 3 mos f/u with PFT/CT.   Marland Kitchen Cough    pt has clear mucus.    HPI:   The patient is here for a chronic cough which has been present for about 5 years. She saw Dr. Meredeth Ide years ago, was tried on advair but made it hard for her to breathe, so she stopped it. The cough is dry, she does not recall trying other things. She was a previous smoker, last in 1990's.  She had a chest x-ray which was suggestive of interstitial lung disease,  however upon performing a high-resolution CT no evidence of ILD was found.  There were some tiny nodules.  She continues to have the cough, she has nodules in her throat, and was told that this is likely related to her RA. The cough appears to originate in her throat which would make sense.   **Imaging personally reviewed, CT chest high-resolution 11/18/17; no evidence of interstitial lung disease, there are several tiny lung nodules, the largest of which is an 8 mm groundglass nodule, there are solid nodules which are 4 mm or less.  PFT 11/18/17. Spirometry FVC 126% of predicted, FEV1 equals 101%, ratio was 65%, there was no bronchodilator administered. Lung volumes show TLC of 135%, vital capacity 126%.  RV to TLC ratio is normal. DLCO is normal at 95%.  Flow volume loop suggests obstruction.  Overall this test is suggestive of mild COPD.    Social Hx:   Social History   Tobacco Use  . Smoking status: Former Smoker    Last attempt to quit: 12/30/1988    Years since quitting: 28.9  . Smokeless tobacco: Never Used  Substance Use Topics  . Alcohol use: No    Alcohol/week: 0.0 oz  . Drug use: No   Medication:    Current Outpatient Medications:  .  abatacept (ORENCIA) 250 MG injection, Once every month, Disp: , Rfl:  .  albuterol (PROVENTIL HFA;VENTOLIN HFA) 108 (90 BASE) MCG/ACT inhaler, Inhale 2 puffs into  the lungs every 6 (six) hours as needed for wheezing or shortness of breath., Disp: 1 Inhaler, Rfl: 2 .  amLODipine (NORVASC) 2.5 MG tablet, TAKE 1 TABLET (2.5 MG TOTAL) BY MOUTH DAILY., Disp: 90 tablet, Rfl: 1 .  aspirin 81 MG chewable tablet, Chew 81 mg by mouth daily., Disp: , Rfl:  .  calcium gluconate 650 MG tablet, Take 650 mg by mouth daily., Disp: , Rfl:  .  cholecalciferol (VITAMIN D) 1000 UNITS tablet, Take 1,000 Units by mouth daily., Disp: , Rfl:  .  dabigatran (PRADAXA) 75 MG CAPS capsule, Take 75 mg by mouth 2 (two) times daily., Disp: , Rfl:  .  fluticasone (FLONASE)  50 MCG/ACT nasal spray, Place 2 sprays into the nose daily as needed., Disp: , Rfl:  .  losartan (COZAAR) 100 MG tablet, TAKE 1 TABLET (100 MG TOTAL) BY MOUTH DAILY., Disp: 90 tablet, Rfl: 1 .  lovastatin (MEVACOR) 10 MG tablet, Take 1 tablet (10 mg total) by mouth at bedtime., Disp: 30 tablet, Rfl: 5 .  magnesium oxide (MAG-OX) 400 MG tablet, Take 400 mg by mouth daily., Disp: , Rfl:  .  methotrexate (RHEUMATREX) 2.5 MG tablet, Take 2.5 mg by mouth once a week. , Disp: , Rfl:  .  pantoprazole (PROTONIX) 40 MG tablet, TAKE 1 TABLET BY MOUTH EVERY DAY, Disp: 30 tablet, Rfl: 2 .  valACYclovir (VALTREX) 1000 MG tablet, Take 1 tablet (1,000 mg total) by mouth 2 (two) times daily., Disp: 20 tablet, Rfl: 0   Allergies:  Oxycodone; Remicade [infliximab]; Simvastatin; Sulfa antibiotics; Zetia [ezetimibe]; Latex; and Tape  Review of Systems: Gen:  Denies  fever, sweats, chills HEENT: Denies blurred vision, double vision. bleeds, sore throat Cvc:  No dizziness, chest pain. Resp:   Denies  shortness of breath Gi: Denies swallowing difficulty, stomach pain. Gu:  Denies bladder incontinence, burning urine Ext:   No Joint pain, stiffness. Skin: No skin rash,  hives  Endoc:  No polyuria, polydipsia. Psych: No depression, insomnia. Other:  All other systems were reviewed with the patient and were negative other that what is mentioned in the HPI.   Physical Examination:   VS: BP 110/66 (BP Location: Left Arm, Cuff Size: Normal)   Pulse 75   Resp 16   Ht 4\' 11"  (1.499 m)   Wt 112 lb (50.8 kg)   SpO2 96%   BMI 22.62 kg/m   General Appearance: No distress  Neuro:without focal findings,  speech normal,  HEENT: PERRLA, EOM intact.   Pulmonary: normal breath sounds, No wheezing.  CardiovascularNormal S1,S2.  No m/r/g.   Abdomen: Benign, Soft, non-tender. Renal:  No costovertebral tenderness  GU:  No performed at this time. Endoc: No evident thyromegaly, no signs of acromegaly. Skin:   warm, no  rashes, no ecchymosis  Extremities: normal, no cyanosis, clubbing.  Other findings:    LABORATORY PANEL:   CBC No results for input(s): WBC, HGB, HCT, PLT in the last 168 hours. ------------------------------------------------------------------------------------------------------------------  Chemistries  No results for input(s): NA, K, CL, CO2, GLUCOSE, BUN, CREATININE, CALCIUM, MG, AST, ALT, ALKPHOS, BILITOT in the last 168 hours.  Invalid input(s): GFRCGP ------------------------------------------------------------------------------------------------------------------  Cardiac Enzymes No results for input(s): TROPONINI in the last 168 hours. ------------------------------------------------------------  RADIOLOGY:  Ct Chest High Resolution  Result Date: 11/18/2017 CLINICAL DATA:  71 year old female with history of rheumatoid arthritis presenting with chronic cough. Former smoker. Evaluate for interstitial lung disease. EXAM: CT CHEST WITHOUT CONTRAST TECHNIQUE: Multidetector CT imaging of the chest was  performed following the standard protocol without intravenous contrast. High resolution imaging of the lungs, as well as inspiratory and expiratory imaging, was performed. COMPARISON:  No priors. FINDINGS: Cardiovascular: Heart size is normal. There is no significant pericardial fluid, thickening or pericardial calcification. There is aortic atherosclerosis, as well as atherosclerosis of the great vessels of the mediastinum and the coronary arteries, including calcified atherosclerotic plaque in the left main, left anterior descending, left circumflex and right coronary arteries. Mediastinum/Nodes: No pathologically enlarged mediastinal or hilar lymph nodes. Please note that accurate exclusion of hilar adenopathy is limited on noncontrast CT scans. Esophagus is unremarkable in appearance. No axillary lymphadenopathy. Lungs/Pleura: Several tiny calcified and noncalcified pulmonary nodules  are noted in the lungs bilaterally. The calcified nodules are compatible with tiny calcified granulomas. The largest noncalcified solid-appearing nodule is in the lateral segment of the right middle lobe (axial image 79 of series 3) measuring 4 mm. The largest ground-glass attenuation nodule is in the left lower lobe measuring 8 x 6 mm (axial image 77 of series 3). No other larger more suspicious appearing pulmonary nodules or masses are noted. High-resolution images demonstrate no other significant regions of ground-glass attenuation, subpleural reticulation, parenchymal banding, traction bronchiectasis or frank honeycombing. Mild diffuse bronchial wall thickening. Mild bilateral apical nodular pleuroparenchymal thickening, most compatible with mild post infectious or inflammatory scarring. Inspiratory and expiratory imaging is unremarkable. Upper Abdomen: Status post cholecystectomy. Several small low-attenuation lesions scattered throughout the liver, incompletely characterized on today's noncontrast CT examination, but statistically likely to represent tiny cysts. Aortic atherosclerosis. Musculoskeletal: There are no aggressive appearing lytic or blastic lesions noted in the visualized portions of the skeleton. IMPRESSION: 1. No findings to suggest interstitial lung disease. 2. Mild diffuse bronchial wall thickening which may suggest chronic bronchitis. 3. Multiple tiny pulmonary nodules scattered throughout both lungs. The largest of these is a ground-glass attenuation nodule in the left lower lobe measuring 8 x 6 mm (mean diameter 7 mm). Initial follow-up with CT at 6-12 months is recommended to confirm persistence. If nodules persist, subsequent management will be based upon the most suspicious nodule(s). This recommendation follows the consensus statement: Guidelines for Management of Incidental Pulmonary Nodules Detected on CT Images: From the Fleischner Society 2017; Radiology 2017; 284:228-243. 4. Aortic  atherosclerosis, in addition to left main and 3 vessel coronary artery disease. Assessment for potential risk factor modification, dietary therapy or pharmacologic therapy may be warranted, if clinically indicated. 5. Additional incidental findings, as above. Aortic Atherosclerosis (ICD10-I70.0). Electronically Signed   By: Trudie Reed M.D.   On: 11/18/2017 17:06       Thank  you for the consultation and for allowing [facility] to assist in the care of your patient. Our recommendations are noted above.  Please contact us if we can be of further service.   Wells Guiles, MD.  Board Certified in Internal Medicine, Pulmonary Medicine, Critical Care Medicine, and Sleep Medicine.  [facility] Office Number: [phone]  Santiago Glad, M.D.  Billy Fischer, M.D  11/19/2017

## 2017-11-24 ENCOUNTER — Encounter: Payer: Self-pay | Admitting: Internal Medicine

## 2017-11-24 ENCOUNTER — Ambulatory Visit: Payer: Medicare HMO | Admitting: Internal Medicine

## 2017-11-24 VITALS — BP 110/66 | HR 75 | Resp 16 | Ht 59.0 in | Wt 112.0 lb

## 2017-11-24 DIAGNOSIS — R918 Other nonspecific abnormal finding of lung field: Secondary | ICD-10-CM | POA: Diagnosis not present

## 2017-11-24 DIAGNOSIS — Z23 Encounter for immunization: Secondary | ICD-10-CM

## 2017-11-24 DIAGNOSIS — R05 Cough: Secondary | ICD-10-CM | POA: Diagnosis not present

## 2017-11-24 DIAGNOSIS — J449 Chronic obstructive pulmonary disease, unspecified: Secondary | ICD-10-CM | POA: Diagnosis not present

## 2017-11-24 DIAGNOSIS — R059 Cough, unspecified: Secondary | ICD-10-CM

## 2017-11-24 MED ORDER — PNEUMOCOCCAL 13-VAL CONJ VACC IM SUSP: 0.5000 mL | Freq: Once | INTRAMUSCULAR | 0 refills | Status: AC

## 2017-11-24 NOTE — Patient Instructions (Addendum)
Your CT scan showed small lung nodules, which may be related to rheumatoid arthritis.   --These need to be followed over time to make sure they do not grow.   --Would recommend that you get pneumonia vaccine.

## 2017-11-24 NOTE — Addendum Note (Signed)
Addended by: Janean Sark on: 11/24/2017 03:17 PM   Modules accepted: Orders

## 2017-11-25 ENCOUNTER — Other Ambulatory Visit: Payer: Self-pay | Admitting: Family

## 2017-11-25 DIAGNOSIS — R05 Cough: Secondary | ICD-10-CM

## 2017-11-25 DIAGNOSIS — R053 Chronic cough: Secondary | ICD-10-CM

## 2017-11-25 MED ORDER — PNEUMOCOCCAL 13-VAL CONJ VACC IM SUSP
0.5000 mL | Freq: Once | INTRAMUSCULAR | Status: AC
  Administered 2017-11-25: 0.5 mL via INTRAMUSCULAR

## 2017-11-25 NOTE — Telephone Encounter (Signed)
Received refill request for doxycycline.  This is an abx and was used to treat an acute problem.  Should not need automatic refill.  Just saw pulmonary yesterday and there was no mention of needing abx.  If problems, will need to be seen.

## 2017-11-25 NOTE — Telephone Encounter (Signed)
Okay to refill? 

## 2017-11-25 NOTE — Addendum Note (Signed)
Addended by: Janean Sark on: 11/25/2017 09:08 AM   Modules accepted: Orders

## 2017-12-02 DIAGNOSIS — M0579 Rheumatoid arthritis with rheumatoid factor of multiple sites without organ or systems involvement: Secondary | ICD-10-CM | POA: Diagnosis not present

## 2017-12-11 ENCOUNTER — Other Ambulatory Visit: Payer: Self-pay | Admitting: Internal Medicine

## 2017-12-14 ENCOUNTER — Other Ambulatory Visit: Payer: Self-pay | Admitting: Internal Medicine

## 2017-12-15 IMAGING — MR MR HEAD WO/W CM
10 of 12 series · 37 of 48 positions shown · IV contrast (multihance)
Comparison: Brain MRI 01/05/2015.

ADDENDUM:
Study discussed by telephone with Dr. LUSINE JIM on 04/08/2016 at
8358 hours.
CLINICAL DATA: 69-year-old female with amaurosis fugax episode 6
weeks ago, on the right lasting half an hour. Associated headache at
that time. Initial encounter.

EXAM:
MRI HEAD WITHOUT AND WITH CONTRAST
TECHNIQUE: Multiplanar, multiecho pulse sequences of the brain and surrounding
structures were obtained without and with intravenous contrast.
CONTRAST:  10mL MULTIHANCE GADOBENATE DIMEGLUMINE 529 MG/ML IV SOLN

[Series 4: DWI · axial · 4.0mm · 0.94mm/px · z∈[-53,+110]mm · 5 of 42 slices shown (1 of 4)]
[im 1/42]
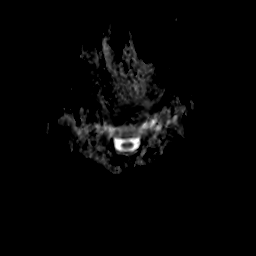
[im 11/42]
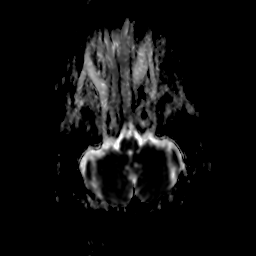
[im 21/42]
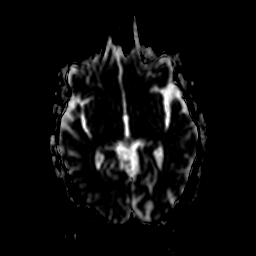
[im 31/42]
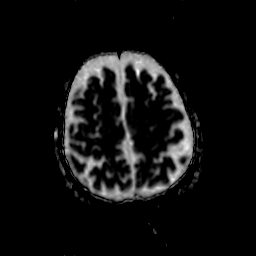
[im 42/42]
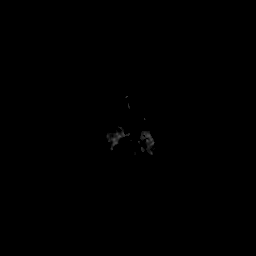

[Series 5: DWI · axial · 4.0mm · 0.94mm/px · z∈[-37,+102]mm · 4 of 36 slices shown (2 of 4)]
[im 1/36]
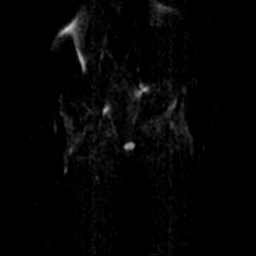
[im 12/36]
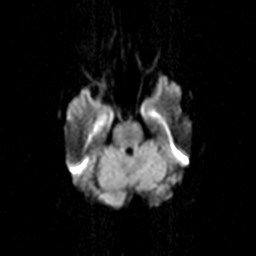
[im 24/36]
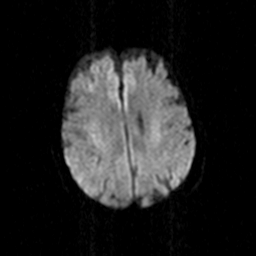
[im 36/36]
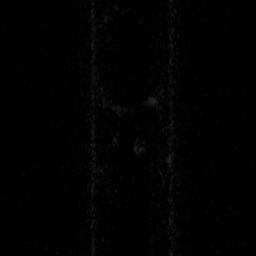

[Series 7: DWI · coronal · 5.0mm · 1.80mm/px · 4 of 38 slices shown (3 of 4)]
[im 1/38]
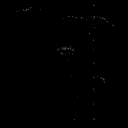
[im 13/38]
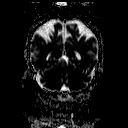
[im 25/38]
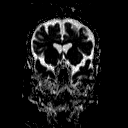
[im 38/38]
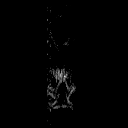

[Series 8: DWI · coronal · 5.0mm · 1.80mm/px · 4 of 37 slices shown (4 of 4)]
[im 1/37]
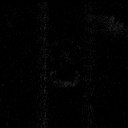
[im 13/37]
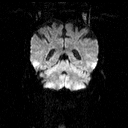
[im 25/37]
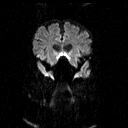
[im 37/37]
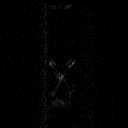

[Series 9: T2 · axial · 5.0mm · 0.45mm/px · z∈[-45,+116]mm · 3 of 26 slices shown (1 of 2)]
[im 1/26]
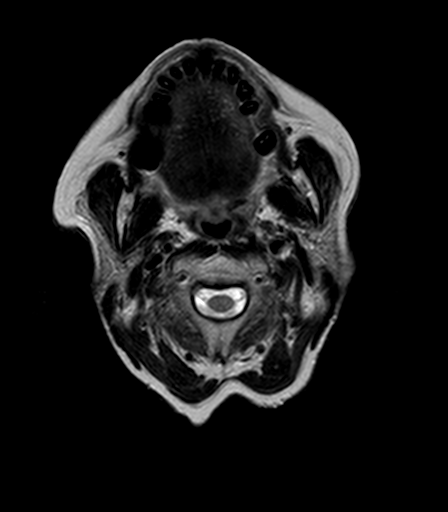
[im 13/26]
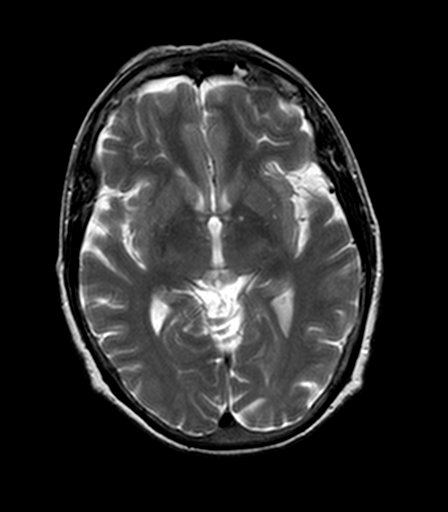
[im 26/26]
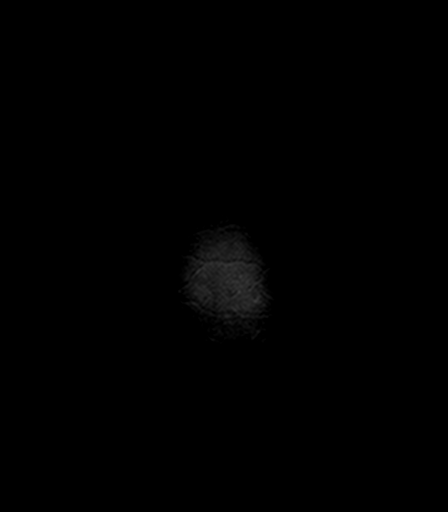

[Series 10: FLAIR · axial · 5.0mm · 0.90mm/px · z∈[-45,+116]mm · 3 of 26 slices shown]
[im 1/26]
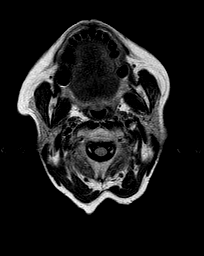
[im 13/26]
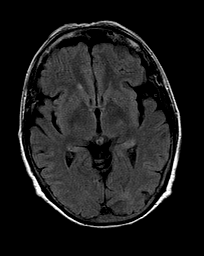
[im 26/26]
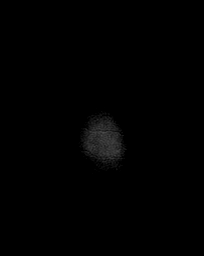

[Series 11: T2 · axial · 5.0mm · 0.45mm/px · z∈[-45,+116]mm · 3 of 26 slices shown (2 of 2)]
[im 1/26]
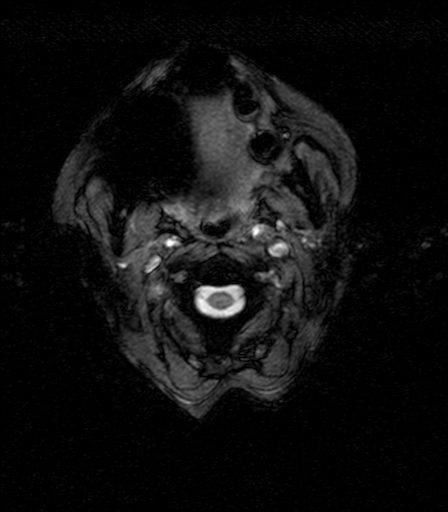
[im 13/26]
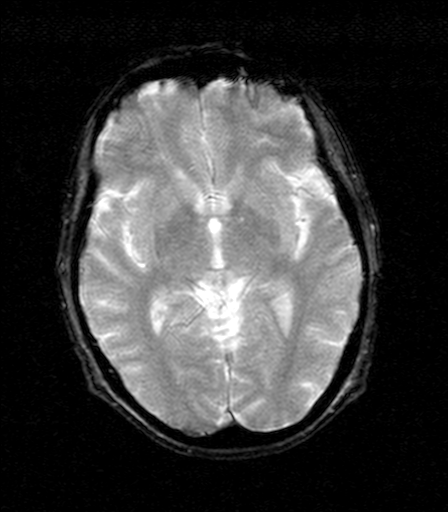
[im 26/26]
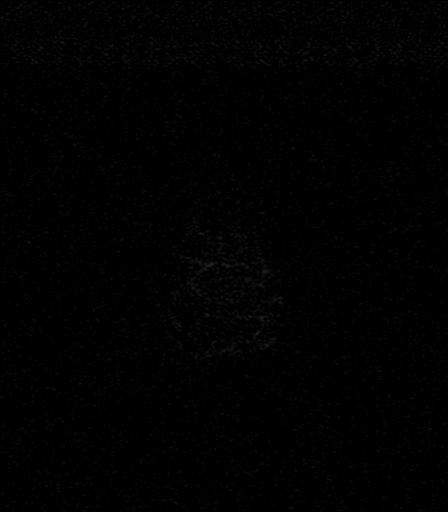

[Series 13: T2 post-contrast · coronal · 5.0mm · 0.45mm/px · 2 of 29 slices shown]
[im 1/29]
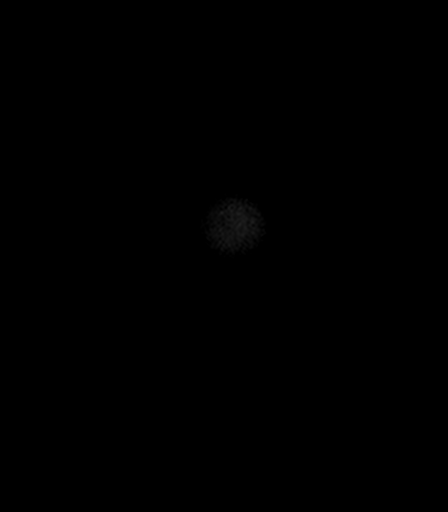
[im 15/29]
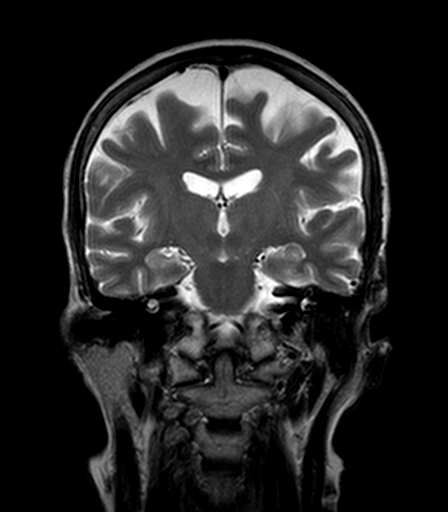

[Series 14: T1 post-contrast · axial · 3.0mm · 0.45mm/px · z∈[-52,+112]mm · 6 of 56 slices shown (1 of 2)]
[im 1/56]
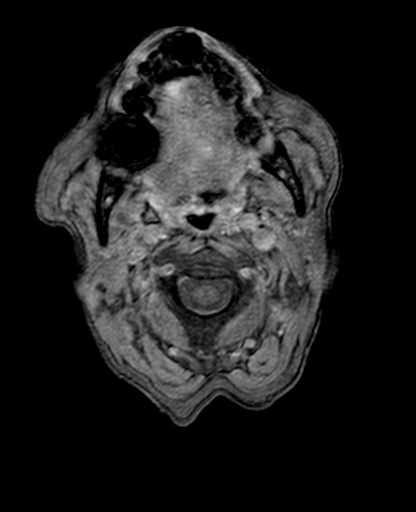
[im 12/56]
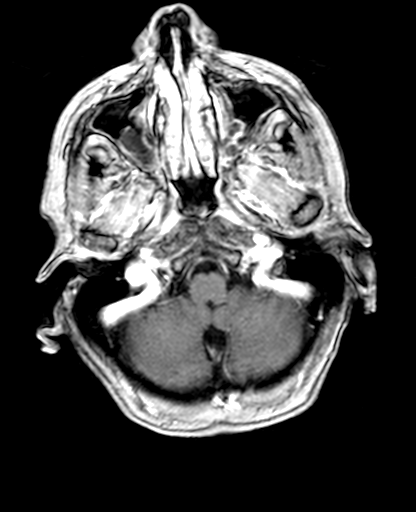
[im 23/56]
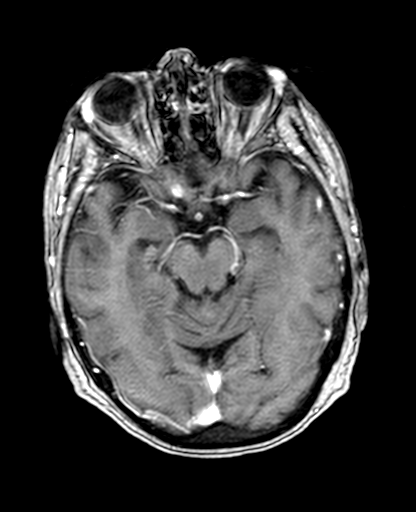
[im 34/56]
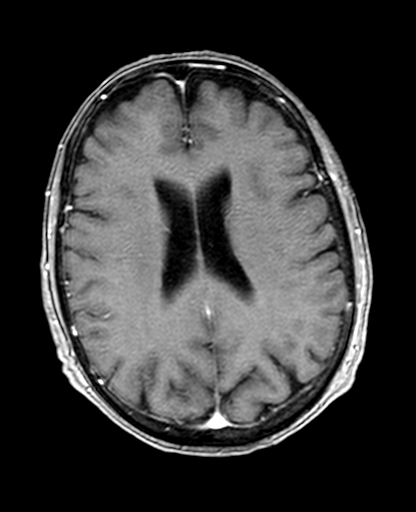
[im 45/56]
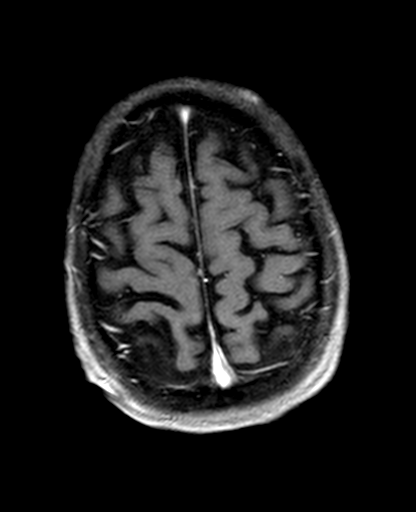
[im 56/56]
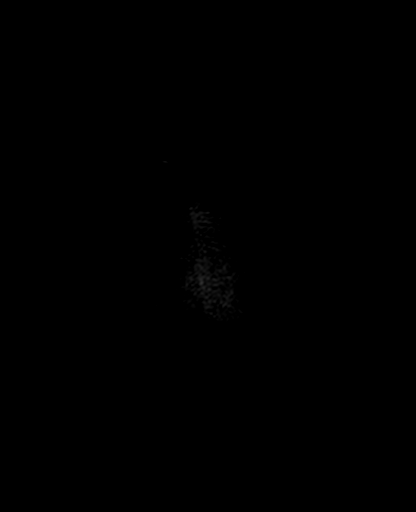

[Series 15: T1 post-contrast · coronal · 5.0mm · 0.45mm/px · 3 of 29 slices shown (2 of 2)]
[im 1/29]
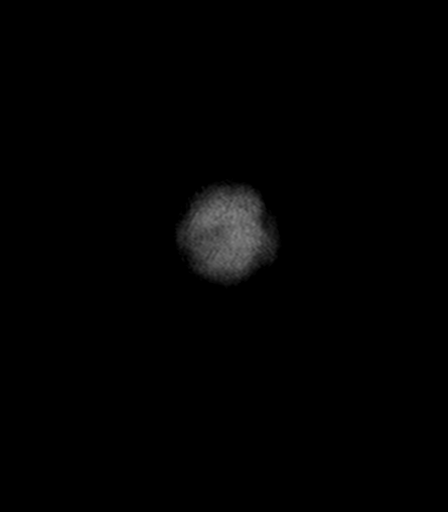
[im 15/29]
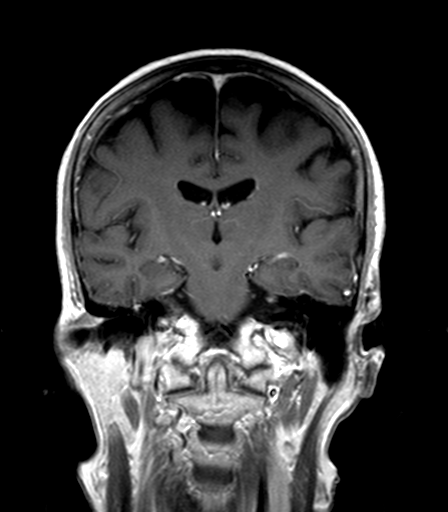
[im 29/29]
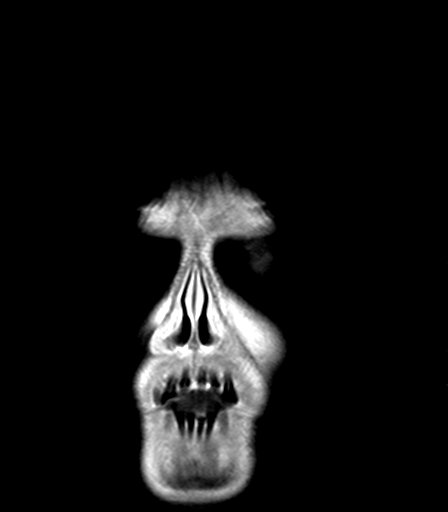

[37 of 48 positions shown; findings below may reference images not displayed]

FINDINGS: Major intracranial vascular flow voids are stable. Cerebral volume
is stable and normal for age No restricted diffusion to suggest
acute infarction. No midline shift, mass effect, evidence of mass
lesion, ventriculomegaly, extra-axial collection or acute
intracranial hemorrhage. Cervicomedullary junction and pituitary are
within normal limits. Stable and normal gray and white matter
signal. No cortical encephalomalacia or chronic cerebral blood
products.

Following contrast administration there is a 5-6 mm enhancing
contour abnormality along the anterior aspect of the right cavernous
sinus near the right ICA siphon anterior genu (series 14, image 20).
This is associated with subtle abnormality on the precontrast series
12, image 20. This lesion may have a flow void on coronal T2 (series
13, image 20). Otherwise the cavernous sinus appears normal. No
other abnormal intracranial enhancement ; incidental small
developmental venous anomaly in the left cerebellum (normal
variant).

Visible internal auditory structures appear normal. Mastoids are
clear. Continued widespread maxillary sinus mucosal thickening.
Small fluid level today in the left maxillary sinus. Orbit and scalp
soft tissues appear stable and normal. Normal bone marrow signal.
Visualized cervical spine appears stable with small disc protrusions
at each visible level.
IMPRESSION: 1. Small 5-6 mm rounded or saccular lesion along the anterior right
cavernous sinus directed laterally. The constellation of clinical
and imaging findings is most suspicious for a right para ophthalmic
artery aneurysm of the right ICA. Recommend follow-up noncontrast
MRA head to evaluate further.
The main differential consideration is a small right cavernous sinus
meningioma.
2. Otherwise normal for age MRI appearance of the brain.
3. Acute on chronic widespread paranasal sinusitis.

## 2018-01-01 ENCOUNTER — Encounter: Payer: Self-pay | Admitting: Internal Medicine

## 2018-01-01 ENCOUNTER — Ambulatory Visit: Payer: Medicare HMO | Admitting: Internal Medicine

## 2018-01-01 VITALS — BP 118/74 | HR 71 | Temp 98.3°F | Resp 18 | Ht 59.0 in | Wt 113.0 lb

## 2018-01-01 DIAGNOSIS — R918 Other nonspecific abnormal finding of lung field: Secondary | ICD-10-CM

## 2018-01-01 DIAGNOSIS — R490 Dysphonia: Secondary | ICD-10-CM | POA: Diagnosis not present

## 2018-01-01 DIAGNOSIS — J019 Acute sinusitis, unspecified: Secondary | ICD-10-CM

## 2018-01-01 MED ORDER — FLUTICASONE PROPIONATE 50 MCG/ACT NA SUSP: 1.0000 | Freq: Every day | NASAL | 2 refills | Status: DC | PRN

## 2018-01-01 MED ORDER — AZITHROMYCIN 250 MG PO TABS: ORAL_TABLET | ORAL | 0 refills | Status: DC

## 2018-01-01 NOTE — Patient Instructions (Signed)
Follow up as scheduled  Take Zpack 2 pills today and 1 pill day 2-5  Use nasal saline then Flonase 1-2 sprays max each nose as needed for nasal congestion. Try to use this at night sometimes people complain it makes then tired   Sinusitis, Adult Sinusitis is soreness and inflammation of your sinuses. Sinuses are hollow spaces in the bones around your face. They are located:  Around your eyes.  In the middle of your forehead.  Behind your nose.  In your cheekbones.  Your sinuses and nasal passages are lined with a stringy fluid (mucus). Mucus normally drains out of your sinuses. When your nasal tissues get inflamed or swollen, the mucus can get trapped or blocked so air cannot flow through your sinuses. This lets bacteria, viruses, and funguses grow, and that leads to infection. Follow these instructions at home: Medicines  Take, use, or apply over-the-counter and prescription medicines only as told by your doctor. These may include nasal sprays.  If you were prescribed an antibiotic medicine, take it as told by your doctor. Do not stop taking the antibiotic even if you start to feel better. Hydrate and Humidify  Drink enough water to keep your pee (urine) clear or pale yellow.  Use a cool mist humidifier to keep the humidity level in your home above 50%.  Breathe in steam for 10-15 minutes, 3-4 times a day or as told by your doctor. You can do this in the bathroom while a hot shower is running.  Try not to spend time in cool or dry air. Rest  Rest as much as possible.  Sleep with your head raised (elevated).  Make sure to get enough sleep each night. General instructions  Put a warm, moist washcloth on your face 3-4 times a day or as told by your doctor. This will help with discomfort.  Wash your hands often with soap and water. If there is no soap and water, use hand sanitizer.  Do not smoke. Avoid being around people who are smoking (secondhand smoke).  Keep all  follow-up visits as told by your doctor. This is important. Contact a doctor if:  You have a fever.  Your symptoms get worse.  Your symptoms do not get better within 10 days. Get help right away if:  You have a very bad headache.  You cannot stop throwing up (vomiting).  You have pain or swelling around your face or eyes.  You have trouble seeing.  You feel confused.  Your neck is stiff.  You have trouble breathing. This information is not intended to replace advice given to you by your health care provider. Make sure you discuss any questions you have with your health care provider. Document Released: 06/03/2008 Document Revised: 08/11/2016 Document Reviewed: 10/11/2015 Elsevier Interactive Patient Education  [employer].

## 2018-01-01 NOTE — Progress Notes (Signed)
Chief Complaint  Patient presents with  . Nasal Congestion   F/u  1. She c/o nasal congestion, runny nose with yellow sputum x 10 days. She feels when she gets sinus issues her face feels "tired".  She tried nasal saline, doubling the dose of Elderberry w/o relief. She denies fever, chills, sob, headache. She has chronic cough which has not changed, she denies sick contacts and h/o allergies   2. Hoarseness she has see Dr. Jenne Campus and he saw some vocal cord nodules but did not want to bx per pt 3. CT 11/18/17 chronic bronchitis changes, pulmonary nodules, ground glass changes rec repeat in 6-12 months. She is a former smoker   Review of Systems  Constitutional: Negative for chills and fever.  HENT: Positive for congestion.        +nasal discharge   Respiratory: Positive for cough. Negative for shortness of breath.   Cardiovascular: Negative for chest pain.  Musculoskeletal: Positive for joint pain.  Neurological: Negative for headaches.   Past Medical History:  Diagnosis Date  . Family history of brain aneurysm   . GERD (gastroesophageal reflux disease)   . Hyperlipidemia   . Hypertension   . MRSA (methicillin resistant Staphylococcus aureus)    skin infections  . Rheumatoid arthritis(714.0)    transaminitis on MTX, remicade rxn, s/p sulfasalazine, orencia  . Ulcer disease    Past Surgical History:  Procedure Laterality Date  . CHOLECYSTECTOMY  2010  . INTRACRANIAL ANEURYSM REPAIR  05/29/2016  . MCP replacements  2001   left  . TUBAL LIGATION     Family History  Problem Relation Age of Onset  . Heart disease Mother   . Diabetes Mother   . Hypercholesterolemia Mother   . Heart disease Father   . Heart disease Brother        MI age 68  . Hypercholesterolemia Sister   . Cancer Sister   . Colon polyps Sister   . Arthritis/Rheumatoid Paternal Aunt   . Arthritis/Rheumatoid Paternal Uncle   . Breast cancer Neg Hx    Social History   Socioeconomic History  . Marital  status: Widowed    Spouse name: Not on file  . Number of children: 1  . Years of education: Not on file  . Highest education level: Not on file  Social Needs  . Financial resource strain: Not on file  . Food insecurity - worry: Not on file  . Food insecurity - inability: Not on file  . Transportation needs - medical: Not on file  . Transportation needs - non-medical: Not on file  Occupational History  . Not on file  Tobacco Use  . Smoking status: Former Smoker    Last attempt to quit: 12/30/1988    Years since quitting: 29.0  . Smokeless tobacco: Never Used  Substance and Sexual Activity  . Alcohol use: No    Alcohol/week: 0.0 oz  . Drug use: No  . Sexual activity: Yes  Other Topics Concern  . Not on file  Social History Narrative   Enjoys dancing    Current Meds  Medication Sig  . ELDERBERRY PO Take by mouth daily.   Allergies  Allergen Reactions  . Oxycodone Nausea Only  . Remicade [Infliximab] Other (See Comments)    Lightheadedness  . Simvastatin   . Sulfa Antibiotics   . Zetia [Ezetimibe] Other (See Comments)    Muscle aches   . Latex Rash  . Tape Rash   Recent Results (from the past 2160  hour(s))  Hemoglobin A1C     Status: None   Collection Time: 10/13/17 10:47 AM  Result Value Ref Range   Hgb A1c MFr Bld 5.9 4.6 - 6.5 %    Comment: Glycemic Control Guidelines for People with Diabetes:Non Diabetic:  <6%Goal of Therapy: <7%Additional Action Suggested:  >8%   Lipid Profile     Status: Abnormal   Collection Time: 10/13/17 10:47 AM  Result Value Ref Range   Cholesterol 174 0 - 200 mg/dL    Comment: ATP III Classification       Desirable:  < 200 mg/dL               Borderline High:  200 - 239 mg/dL          High:  > = 712 mg/dL   Triglycerides 458.0 (H) 0.0 - 149.0 mg/dL    Comment: Normal:  <998 mg/dLBorderline High:  150 - 199 mg/dL   HDL 33.82 (L) >50.53 mg/dL   VLDL 97.6 0.0 - 73.4 mg/dL   LDL Cholesterol 193 (H) 0 - 99 mg/dL   Total CHOL/HDL Ratio 5      Comment:                Men          Women1/2 Average Risk     3.4          3.3Average Risk          5.0          4.42X Average Risk          9.6          7.13X Average Risk          15.0          11.0                       NonHDL 136.69     Comment: NOTE:  Non-HDL goal should be 30 mg/dL higher than patient's LDL goal (i.e. LDL goal of < 70 mg/dL, would have non-HDL goal of < 100 mg/dL)  TSH     Status: None   Collection Time: 10/13/17 10:47 AM  Result Value Ref Range   TSH 2.61 0.35 - 4.50 uIU/mL  VITAMIN D 25 Hydroxy (Vit-D Deficiency, Fractures)     Status: None   Collection Time: 10/13/17 10:47 AM  Result Value Ref Range   VITD 57.68 30.00 - 100.00 ng/mL  Hepatic function panel     Status: None   Collection Time: 10/14/17 10:02 AM  Result Value Ref Range   Total Bilirubin 0.8 0.2 - 1.2 mg/dL   Bilirubin, Direct 0.0 0.0 - 0.3 mg/dL   Alkaline Phosphatase 97 39 - 117 U/L   AST 18 0 - 37 U/L   ALT 12 0 - 35 U/L   Total Protein 6.6 6.0 - 8.3 g/dL   Albumin 4.0 3.5 - 5.2 g/dL  CBC with Differential/Platelet     Status: Abnormal   Collection Time: 10/14/17 10:02 AM  Result Value Ref Range   WBC 9.3 4.0 - 10.5 K/uL   RBC 4.22 3.87 - 5.11 Mil/uL   Hemoglobin 13.0 12.0 - 15.0 g/dL   HCT 79.0 24.0 - 97.3 %   MCV 93.4 78.0 - 100.0 fl   MCHC 33.0 30.0 - 36.0 g/dL   RDW 53.2 99.2 - 42.6 %   Platelets 246.0 150.0 - 400.0 K/uL   Neutrophils Relative % 57.4 43.0 -  77.0 %   Lymphocytes Relative 22.4 12.0 - 46.0 %   Monocytes Relative 7.5 3.0 - 12.0 %   Eosinophils Relative 11.5 (H) 0.0 - 5.0 %   Basophils Relative 1.2 0.0 - 3.0 %   Neutro Abs 5.4 1.4 - 7.7 K/uL   Lymphs Abs 2.1 0.7 - 4.0 K/uL   Monocytes Absolute 0.7 0.1 - 1.0 K/uL   Eosinophils Absolute 1.1 (H) 0.0 - 0.7 K/uL   Basophils Absolute 0.1 0.0 - 0.1 K/uL  Basic metabolic panel     Status: Abnormal   Collection Time: 10/14/17 10:02 AM  Result Value Ref Range   Sodium 130 (L) 135 - 145 mEq/L   Potassium 4.7 3.5 - 5.1  mEq/L   Chloride 97 96 - 112 mEq/L   CO2 20 19 - 32 mEq/L   Glucose, Bld 88 70 - 99 mg/dL   BUN 12 6 - 23 mg/dL   Creatinine, Ser 7.85 0.40 - 1.20 mg/dL   Calcium 9.2 8.4 - 88.5 mg/dL   GFR 02.77 >41.28 mL/min   Objective  Body mass index is 22.82 kg/m. Wt Readings from Last 3 Encounters:  01/01/18 113 lb (51.3 kg)  11/24/17 112 lb (50.8 kg)  11/06/17 111 lb 12.8 oz (50.7 kg)   Temp Readings from Last 3 Encounters:  01/01/18 98.3 F (36.8 C) (Oral)  11/06/17 97.9 F (36.6 C) (Oral)  10/16/17 98.2 F (36.8 C) (Oral)   BP Readings from Last 3 Encounters:  01/01/18 118/74  11/24/17 110/66  11/06/17 118/64   Pulse Readings from Last 3 Encounters:  01/01/18 71  11/24/17 75  11/06/17 74   O2 sat 97% room air  Physical Exam  Constitutional: She is oriented to person, place, and time and well-developed, well-nourished, and in no distress. Vital signs are normal.  HENT:  Head: Normocephalic and atraumatic.  Nose: Right sinus exhibits no maxillary sinus tenderness and no frontal sinus tenderness. Left sinus exhibits no maxillary sinus tenderness and no frontal sinus tenderness.  Mouth/Throat: Oropharynx is clear and moist and mucous membranes are normal.  Mild ethmoid ttp   Eyes: Conjunctivae are normal. Pupils are equal, round, and reactive to light.  Cardiovascular: Normal rate, regular rhythm and normal heart sounds.  Pulmonary/Chest: Effort normal and breath sounds normal. She has no wheezes.  Neurological: She is alert and oriented to person, place, and time. Gait normal.  Skin: Skin is warm and dry. No rash noted.  Psychiatric: Mood, memory, affect and judgment normal.  Nursing note and vitals reviewed.   Assessment   1. Sinusitis, acute  2. Hoarseness followed by Dr. Jenne Campus  3. Lung nodules, ground glass appearance, chronic bronchitis changes noted on CT chest 11/18/17  4. HM  Plan  1. Zpack, NS, Flonase, RTC if not better  2. Per pt he saw nodules on vocal  cords but did not want to bx will rec PCP f/u on this  3. Needs repeat CT chest in 6-12 months  4.  Address vaccines at f/u (flu, Tdap, shingrix, ? If has had pna 23 in the past) prevnar had 11/25/17   DEXA had 06/2017 osteopenia she is on vit D 1000 IU qd. Currently she is not taking calcium 2/2 constipation side effects mammo neg 05/28/17  Colonoscopy 09/23/14 [facility] int  Hemorrhoids FH colon polpys repeat in 5 years Pap 04/22/14 neg out of age window pap  rec repeat CT chest in 6-12 months former smoker with lung nodules and ground glass appearance  Provider: Dr. Olivia Mackie McLean-Scocuzza-[facility]

## 2018-01-20 ENCOUNTER — Other Ambulatory Visit: Payer: Medicare HMO

## 2018-01-20 ENCOUNTER — Other Ambulatory Visit: Payer: Self-pay | Admitting: Internal Medicine

## 2018-01-20 DIAGNOSIS — Z79899 Other long term (current) drug therapy: Secondary | ICD-10-CM | POA: Diagnosis not present

## 2018-01-20 DIAGNOSIS — M0579 Rheumatoid arthritis with rheumatoid factor of multiple sites without organ or systems involvement: Secondary | ICD-10-CM | POA: Diagnosis not present

## 2018-01-21 ENCOUNTER — Other Ambulatory Visit (INDEPENDENT_AMBULATORY_CARE_PROVIDER_SITE_OTHER): Payer: Medicare HMO

## 2018-01-21 DIAGNOSIS — I10 Essential (primary) hypertension: Secondary | ICD-10-CM

## 2018-01-21 DIAGNOSIS — E7800 Pure hypercholesterolemia, unspecified: Secondary | ICD-10-CM

## 2018-01-21 DIAGNOSIS — D721 Eosinophilia, unspecified: Secondary | ICD-10-CM

## 2018-01-21 DIAGNOSIS — E871 Hypo-osmolality and hyponatremia: Secondary | ICD-10-CM | POA: Diagnosis not present

## 2018-01-21 DIAGNOSIS — R739 Hyperglycemia, unspecified: Secondary | ICD-10-CM

## 2018-01-21 LAB — CBC WITH DIFFERENTIAL/PLATELET
BASOS ABS: 0.1 10*3/uL (ref 0.0–0.1)
Basophils Relative: 1.2 % (ref 0.0–3.0)
EOS PCT: 13 % — AB (ref 0.0–5.0)
Eosinophils Absolute: 0.8 10*3/uL — ABNORMAL HIGH (ref 0.0–0.7)
HCT: 40.5 % (ref 36.0–46.0)
HEMOGLOBIN: 13.4 g/dL (ref 12.0–15.0)
Lymphocytes Relative: 32.6 % (ref 12.0–46.0)
Lymphs Abs: 1.9 10*3/uL (ref 0.7–4.0)
MCHC: 33 g/dL (ref 30.0–36.0)
MCV: 92.2 fl (ref 78.0–100.0)
MONO ABS: 0.7 10*3/uL (ref 0.1–1.0)
MONOS PCT: 12.8 % — AB (ref 3.0–12.0)
Neutro Abs: 2.4 10*3/uL (ref 1.4–7.7)
Neutrophils Relative %: 40.4 % — ABNORMAL LOW (ref 43.0–77.0)
Platelets: 194 10*3/uL (ref 150.0–400.0)
RBC: 4.4 Mil/uL (ref 3.87–5.11)
RDW: 15.1 % (ref 11.5–15.5)
WBC: 5.8 10*3/uL (ref 4.0–10.5)

## 2018-01-21 LAB — HEMOGLOBIN A1C: HEMOGLOBIN A1C: 5.7 % (ref 4.6–6.5)

## 2018-01-22 ENCOUNTER — Ambulatory Visit (INDEPENDENT_AMBULATORY_CARE_PROVIDER_SITE_OTHER): Payer: Medicare HMO | Admitting: Internal Medicine

## 2018-01-22 DIAGNOSIS — I700 Atherosclerosis of aorta: Secondary | ICD-10-CM

## 2018-01-22 DIAGNOSIS — E871 Hypo-osmolality and hyponatremia: Secondary | ICD-10-CM | POA: Diagnosis not present

## 2018-01-22 DIAGNOSIS — R739 Hyperglycemia, unspecified: Secondary | ICD-10-CM

## 2018-01-22 DIAGNOSIS — R053 Chronic cough: Secondary | ICD-10-CM

## 2018-01-22 DIAGNOSIS — E7800 Pure hypercholesterolemia, unspecified: Secondary | ICD-10-CM

## 2018-01-22 DIAGNOSIS — Z8679 Personal history of other diseases of the circulatory system: Secondary | ICD-10-CM

## 2018-01-22 DIAGNOSIS — J449 Chronic obstructive pulmonary disease, unspecified: Secondary | ICD-10-CM | POA: Diagnosis not present

## 2018-01-22 DIAGNOSIS — I10 Essential (primary) hypertension: Secondary | ICD-10-CM

## 2018-01-22 DIAGNOSIS — R05 Cough: Secondary | ICD-10-CM | POA: Diagnosis not present

## 2018-01-22 DIAGNOSIS — R918 Other nonspecific abnormal finding of lung field: Secondary | ICD-10-CM

## 2018-01-22 DIAGNOSIS — Z9889 Other specified postprocedural states: Secondary | ICD-10-CM | POA: Diagnosis not present

## 2018-01-22 DIAGNOSIS — R634 Abnormal weight loss: Secondary | ICD-10-CM

## 2018-01-22 DIAGNOSIS — R490 Dysphonia: Secondary | ICD-10-CM

## 2018-01-22 DIAGNOSIS — M069 Rheumatoid arthritis, unspecified: Secondary | ICD-10-CM | POA: Diagnosis not present

## 2018-01-22 LAB — LIPID PANEL
Cholesterol: 173 mg/dL (ref 0–200)
HDL: 45.4 mg/dL (ref >39.00)
LDL CALC: 106 mg/dL — AB (ref 0–99)
NONHDL: 127.47
Total CHOL/HDL Ratio: 4
Triglycerides: 109 mg/dL (ref 0.0–149.0)
VLDL: 21.8 mg/dL (ref 0.0–40.0)

## 2018-01-22 LAB — HEPATIC FUNCTION PANEL
ALBUMIN: 4.2 g/dL (ref 3.5–5.2)
ALT: 15 U/L (ref 0–35)
AST: 24 U/L (ref 0–37)
Alkaline Phosphatase: 100 U/L (ref 39–117)
BILIRUBIN TOTAL: 0.8 mg/dL (ref 0.2–1.2)
Bilirubin, Direct: 0.1 mg/dL (ref 0.0–0.3)
Total Protein: 7 g/dL (ref 6.0–8.3)

## 2018-01-22 LAB — BASIC METABOLIC PANEL
BUN: 10 mg/dL (ref 6–23)
CHLORIDE: 97 meq/L (ref 96–112)
CO2: 25 meq/L (ref 19–32)
Calcium: 9.2 mg/dL (ref 8.4–10.5)
Creatinine, Ser: 0.85 mg/dL (ref 0.40–1.20)
GFR: 69.66 mL/min (ref >60.00)
GLUCOSE: 88 mg/dL (ref 70–99)
Potassium: 4.7 mEq/L (ref 3.5–5.1)
SODIUM: 129 meq/L — AB (ref 135–145)

## 2018-01-22 NOTE — Progress Notes (Signed)
Patient ID: Erica Little, female   DOB: [birth year], 72 y.o.   MRN: [id number]   Subjective:    Patient ID: Erica Little, female    DOB: 1946-03-31, 72 y.o.   MRN: [id number]  HPI  Patient here for a scheduled follow up.  She has a chronic cough.  Has seen pulmonary.  Note reviewed.  Has not responded to typical therapies.  He felt that her symptoms were likely related to vocal cord nodules with chronic hoarseness.  She has chronic interstitial changes seen on cxr, but not seen on CT.  Nodules present on CT.  He recommended f/u CT in 6 months.  Has RA.  Has f/u with Dr Jefm Bryant.  Discussed inhalers and further treatment.  She declines.  She is eating.  No chest pain.  Feels her breathing is stable.  No abdominal pain.  Bowels moving.  Discussed labs.  Discussed low sodium.  No alcohol intake.     Past Medical History:  Diagnosis Date  . Family history of brain aneurysm   . GERD (gastroesophageal reflux disease)   . Hyperlipidemia   . Hypertension   . MRSA (methicillin resistant Staphylococcus aureus)    skin infections  . Rheumatoid arthritis(714.0)    transaminitis on MTX, remicade rxn, s/p sulfasalazine, orencia  . Ulcer disease    Past Surgical History:  Procedure Laterality Date  . CHOLECYSTECTOMY  2010  . INTRACRANIAL ANEURYSM REPAIR  05/29/2016  . MCP replacements  2001   left  . TUBAL LIGATION     Family History  Problem Relation Age of Onset  . Heart disease Mother   . Diabetes Mother   . Hypercholesterolemia Mother   . Heart disease Father   . Heart disease Brother        MI age 66  . Hypercholesterolemia Sister   . Cancer Sister   . Colon polyps Sister   . Arthritis/Rheumatoid Paternal Aunt   . Arthritis/Rheumatoid Paternal Uncle   . Breast cancer Neg Hx    Social History   Socioeconomic History  . Marital status: Widowed    Spouse name: None  . Number of children: 1  . Years of education: None  . Highest education level: None  Social Needs  .  Financial resource strain: None  . Food insecurity - worry: None  . Food insecurity - inability: None  . Transportation needs - medical: None  . Transportation needs - non-medical: None  Occupational History  . None  Tobacco Use  . Smoking status: Former Smoker    Last attempt to quit: 12/30/1988    Years since quitting: 29.0  . Smokeless tobacco: Never Used  Substance and Sexual Activity  . Alcohol use: No    Alcohol/week: 0.0 oz  . Drug use: No  . Sexual activity: Yes  Other Topics Concern  . None  Social History Narrative   Enjoys dancing     Outpatient Encounter Medications as of 01/22/2018  Medication Sig  . abatacept (ORENCIA) 250 MG injection Once every month  . albuterol (PROVENTIL HFA;VENTOLIN HFA) 108 (90 BASE) MCG/ACT inhaler Inhale 2 puffs into the lungs every 6 (six) hours as needed for wheezing or shortness of breath.  Marland Kitchen amLODipine (NORVASC) 2.5 MG tablet TAKE 1 TABLET BY MOUTH EVERY DAY  . aspirin 81 MG chewable tablet Chew 81 mg by mouth daily.  Marland Kitchen azithromycin (ZITHROMAX) 250 MG tablet 2 pills day 1 and 1 pill day 2-5  . calcium gluconate 650 MG tablet Take  650 mg by mouth daily.  . cholecalciferol (VITAMIN D) 1000 UNITS tablet Take 1,000 Units by mouth daily.  . dabigatran (PRADAXA) 75 MG CAPS capsule Take 75 mg by mouth 2 (two) times daily.  Marland Kitchen ELDERBERRY PO Take by mouth daily.  . fluticasone (FLONASE) 50 MCG/ACT nasal spray Place 1-2 sprays into both nostrils daily as needed.  Marland Kitchen losartan (COZAAR) 100 MG tablet TAKE 1 TABLET BY MOUTH EVERY DAY  . magnesium oxide (MAG-OX) 400 MG tablet Take 400 mg by mouth daily.  . methotrexate (RHEUMATREX) 2.5 MG tablet Take 2.5 mg by mouth once a week.   . pantoprazole (PROTONIX) 40 MG tablet TAKE 1 TABLET BY MOUTH EVERY DAY (Patient not taking: Reported on 01/01/2018)  . valACYclovir (VALTREX) 1000 MG tablet Take 1 tablet (1,000 mg total) by mouth 2 (two) times daily.  . [DISCONTINUED] lovastatin (MEVACOR) 10 MG tablet Take 1  tablet (10 mg total) by mouth at bedtime.   No facility-administered encounter medications on file as of 01/22/2018.     Review of Systems  Constitutional: Negative for appetite change and unexpected weight change.  HENT: Positive for congestion and voice change.   Respiratory: Negative for cough, chest tightness and shortness of breath.   Cardiovascular: Negative for chest pain, palpitations and leg swelling.  Gastrointestinal: Negative for abdominal pain, diarrhea, nausea and vomiting.  Genitourinary: Negative for difficulty urinating and dysuria.  Musculoskeletal: Negative for myalgias.       Sees Dr Jefm Bryant for RA.  Persistent joint changes.   Skin: Negative for color change and rash.  Neurological: Negative for dizziness, light-headedness and headaches.  Psychiatric/Behavioral: Negative for agitation and dysphoric mood.       Objective:     Blood pressure rechecked by me:  122/78  Physical Exam  Constitutional: She appears well-developed and well-nourished. No distress.  HENT:  Nose: Nose normal.  Mouth/Throat: Oropharynx is clear and moist.  Neck: Neck supple. No thyromegaly present.  Cardiovascular: Normal rate and regular rhythm.  Pulmonary/Chest: Breath sounds normal. No respiratory distress. She has no wheezes.  Abdominal: Soft. Bowel sounds are normal. There is no tenderness.  Musculoskeletal: She exhibits no edema or tenderness.  Lymphadenopathy:    She has no cervical adenopathy.  Skin: No rash noted. No erythema.  Psychiatric: She has a normal mood and affect. Her behavior is normal.    BP 122/68 (BP Location: Left Arm, Patient Position: Sitting, Cuff Size: Normal)   Pulse 75   Temp 98.1 F (36.7 C) (Oral)   Resp 18   Wt 110 lb 9.6 oz (50.2 kg)   SpO2 98%   BMI 22.34 kg/m  Wt Readings from Last 3 Encounters:  01/22/18 110 lb 9.6 oz (50.2 kg)  01/01/18 113 lb (51.3 kg)  11/24/17 112 lb (50.8 kg)     Lab Results  Component Value Date   WBC 5.8  01/21/2018   HGB 13.4 01/21/2018   HCT 40.5 01/21/2018   PLT 194.0 01/21/2018   GLUCOSE 88 01/21/2018   CHOL 173 01/21/2018   TRIG 109.0 01/21/2018   HDL 45.40 01/21/2018   LDLDIRECT 167.8 12/20/2013   LDLCALC 106 (H) 01/21/2018   ALT 15 01/21/2018   AST 24 01/21/2018   NA 129 (L) 01/21/2018   K 4.7 01/21/2018   CL 97 01/21/2018   CREATININE 0.85 01/21/2018   BUN 10 01/21/2018   CO2 25 01/21/2018   TSH 2.61 10/13/2017   INR 1.1 (H) 12/06/2013   HGBA1C 5.7 01/21/2018  Ct Chest High Resolution  Result Date: 11/18/2017 CLINICAL DATA:  72 year old female with history of rheumatoid arthritis presenting with chronic cough. Former smoker. Evaluate for interstitial lung disease. EXAM: CT CHEST WITHOUT CONTRAST TECHNIQUE: Multidetector CT imaging of the chest was performed following the standard protocol without intravenous contrast. High resolution imaging of the lungs, as well as inspiratory and expiratory imaging, was performed. COMPARISON:  No priors. FINDINGS: Cardiovascular: Heart size is normal. There is no significant pericardial fluid, thickening or pericardial calcification. There is aortic atherosclerosis, as well as atherosclerosis of the great vessels of the mediastinum and the coronary arteries, including calcified atherosclerotic plaque in the left main, left anterior descending, left circumflex and right coronary arteries. Mediastinum/Nodes: No pathologically enlarged mediastinal or hilar lymph nodes. Please note that accurate exclusion of hilar adenopathy is limited on noncontrast CT scans. Esophagus is unremarkable in appearance. No axillary lymphadenopathy. Lungs/Pleura: Several tiny calcified and noncalcified pulmonary nodules are noted in the lungs bilaterally. The calcified nodules are compatible with tiny calcified granulomas. The largest noncalcified solid-appearing nodule is in the lateral segment of the right middle lobe (axial image 79 of series 3) measuring 4 mm. The  largest ground-glass attenuation nodule is in the left lower lobe measuring 8 x 6 mm (axial image 77 of series 3). No other larger more suspicious appearing pulmonary nodules or masses are noted. High-resolution images demonstrate no other significant regions of ground-glass attenuation, subpleural reticulation, parenchymal banding, traction bronchiectasis or frank honeycombing. Mild diffuse bronchial wall thickening. Mild bilateral apical nodular pleuroparenchymal thickening, most compatible with mild post infectious or inflammatory scarring. Inspiratory and expiratory imaging is unremarkable. Upper Abdomen: Status post cholecystectomy. Several small low-attenuation lesions scattered throughout the liver, incompletely characterized on today's noncontrast CT examination, but statistically likely to represent tiny cysts. Aortic atherosclerosis. Musculoskeletal: There are no aggressive appearing lytic or blastic lesions noted in the visualized portions of the skeleton. IMPRESSION: 1. No findings to suggest interstitial lung disease. 2. Mild diffuse bronchial wall thickening which may suggest chronic bronchitis. 3. Multiple tiny pulmonary nodules scattered throughout both lungs. The largest of these is a ground-glass attenuation nodule in the left lower lobe measuring 8 x 6 mm (mean diameter 7 mm). Initial follow-up with CT at 6-12 months is recommended to confirm persistence. If nodules persist, subsequent management will be based upon the most suspicious nodule(s). This recommendation follows the consensus statement: Guidelines for Management of Incidental Pulmonary Nodules Detected on CT Images: From the Fleischner Society 2017; Radiology 2017; 284:228-243. 4. Aortic atherosclerosis, in addition to left main and 3 vessel coronary artery disease. Assessment for potential risk factor modification, dietary therapy or pharmacologic therapy may be warranted, if clinically indicated. 5. Additional incidental findings, as  above. Aortic Atherosclerosis (ICD10-I70.0). Electronically Signed   By: Vinnie Langton M.D.   On: 11/18/2017 17:06       Assessment & Plan:   Problem List Items Addressed This Visit    Aortic atherosclerosis ([place])    On lovastatin.       COPD with chronic bronchitis and emphysema ([place])    Seeing pulmonary.  Note reviewed.  Declines inhalers.        History of cerebral aneurysm     S/p stenting.  Being followed at [facility].  Recommended f/u MRA in one year.  On plavix.  Continue lifelong aspirin.        Hoarseness    Persistent hoarseness.  Saw ENT and pulmonary.  Felt to be related to nodules (vocal cord).  See note.  Desires no further intervention.        Hypercholesteremia    On simvastatin.  She has declined increasing the dose.  Follow lipid panel and liver function tests.        Hyperglycemia    Follow met b and a1c.        Hypertension    Blood pressure under good control.  Continue same medication regimen.  Follow pressures.  Follow metabolic panel.        Hyponatremia    Sodium decreased.  Overall relatively stable.  Denies increased free water intake.  Discussed the need to eat regular meals.  Follow sodium level.        Relevant Orders   Sodium   Lung nodules    CT as outlined.  Saw pulmonary.  Recommended f/u CT chest in 6 months.       Persistent cough    Has a persistent cough.  Has seen pulmonary.  See note.  Declines inhalers or any further treatment or intervention.  Follow.        Rheumatoid arthritis ([place])    On MTX and orencia.  Followed by Dr Jefm Bryant.        Weight loss    She like to keep her weight around 110.  Discussed with her today.  Discussed eating regular meals.  Follow.            Einar Pheasant, MD

## 2018-01-25 ENCOUNTER — Encounter: Payer: Self-pay | Admitting: Internal Medicine

## 2018-01-25 DIAGNOSIS — J449 Chronic obstructive pulmonary disease, unspecified: Secondary | ICD-10-CM | POA: Insufficient documentation

## 2018-01-25 DIAGNOSIS — J439 Emphysema, unspecified: Secondary | ICD-10-CM | POA: Insufficient documentation

## 2018-01-25 NOTE — Assessment & Plan Note (Signed)
Sodium decreased.  Overall relatively stable.  Denies increased free water intake.  Discussed the need to eat regular meals.  Follow sodium level.

## 2018-01-25 NOTE — Assessment & Plan Note (Signed)
Follow met b and a1c.

## 2018-01-25 NOTE — Assessment & Plan Note (Signed)
Persistent hoarseness.  Saw ENT and pulmonary.  Felt to be related to nodules (vocal cord).  See note.  Desires no further intervention.

## 2018-01-25 NOTE — Assessment & Plan Note (Signed)
Seeing pulmonary.  Note reviewed.  Declines inhalers.

## 2018-01-25 NOTE — Assessment & Plan Note (Signed)
CT as outlined.  Saw pulmonary.  Recommended f/u CT chest in 6 months.

## 2018-01-25 NOTE — Assessment & Plan Note (Signed)
On MTX and orencia.  Followed by Dr Kernodle.  

## 2018-01-25 NOTE — Assessment & Plan Note (Signed)
On lovastatin 

## 2018-01-25 NOTE — Assessment & Plan Note (Signed)
On simvastatin.  She has declined increasing the dose.  Follow lipid panel and liver function tests.

## 2018-01-25 NOTE — Assessment & Plan Note (Signed)
Has a persistent cough.  Has seen pulmonary.  See note.  Declines inhalers or any further treatment or intervention.  Follow.

## 2018-01-25 NOTE — Assessment & Plan Note (Signed)
She like to keep her weight around 110.  Discussed with her today.  Discussed eating regular meals.  Follow.

## 2018-01-25 NOTE — Assessment & Plan Note (Signed)
S/p stenting.  Being followed at [facility].  Recommended f/u MRA in one year.  On plavix.  Continue lifelong aspirin.

## 2018-01-25 NOTE — Assessment & Plan Note (Signed)
Blood pressure under good control.  Continue same medication regimen.  Follow pressures.  Follow metabolic panel.   

## 2018-01-27 ENCOUNTER — Other Ambulatory Visit: Payer: Self-pay | Admitting: Internal Medicine

## 2018-02-05 ENCOUNTER — Other Ambulatory Visit (INDEPENDENT_AMBULATORY_CARE_PROVIDER_SITE_OTHER): Payer: Medicare HMO

## 2018-02-05 DIAGNOSIS — E871 Hypo-osmolality and hyponatremia: Secondary | ICD-10-CM

## 2018-02-05 LAB — SODIUM: Sodium: 128 mEq/L — ABNORMAL LOW (ref 135–145)

## 2018-02-09 ENCOUNTER — Other Ambulatory Visit: Payer: Self-pay | Admitting: Internal Medicine

## 2018-02-09 DIAGNOSIS — E871 Hypo-osmolality and hyponatremia: Secondary | ICD-10-CM

## 2018-02-09 NOTE — Progress Notes (Signed)
Order placed for nephrology referral.   °

## 2018-03-02 DIAGNOSIS — M810 Age-related osteoporosis without current pathological fracture: Secondary | ICD-10-CM | POA: Diagnosis not present

## 2018-03-02 DIAGNOSIS — R05 Cough: Secondary | ICD-10-CM | POA: Diagnosis not present

## 2018-03-02 DIAGNOSIS — M0579 Rheumatoid arthritis with rheumatoid factor of multiple sites without organ or systems involvement: Secondary | ICD-10-CM | POA: Diagnosis not present

## 2018-03-16 ENCOUNTER — Encounter: Payer: Self-pay | Admitting: Primary Care

## 2018-03-16 ENCOUNTER — Ambulatory Visit: Payer: Medicare HMO | Admitting: Primary Care

## 2018-03-16 VITALS — BP 122/82 | HR 84 | Temp 98.2°F | Ht 59.0 in | Wt 109.8 lb

## 2018-03-16 DIAGNOSIS — J069 Acute upper respiratory infection, unspecified: Secondary | ICD-10-CM | POA: Diagnosis not present

## 2018-03-16 NOTE — Progress Notes (Signed)
Subjective:    Patient ID: Erica Little, female    DOB: 11/04/1946, 72 y.o.   MRN: [id number]  HPI  Erica Little is a 72 year old female with a history of persistent cough, tobacco abuse, pulmonary nodules, rheumatoid arthritis on methotrexate ,COPD who presents today with a chief complaint of cough.  She also reports sore throat, hoarse voice. Her symptoms began 2 days ago with a cough and sore throat. Her cough is non productive. She's been taking cough drops and Coricidin without improvement. She denies fevers, shortness of breath, wheezing. She's not used her albuterol inhaler as it "doesn't do any good".   She was last treated with antibiotics on 01/01/18 for acute sinusitis.   Review of Systems  Constitutional: Negative for chills, fatigue and fever.  HENT: Positive for postnasal drip and sore throat. Negative for congestion and sinus pressure.   Respiratory: Positive for cough. Negative for shortness of breath and wheezing.        Past Medical History:  Diagnosis Date  . Family history of brain aneurysm   . GERD (gastroesophageal reflux disease)   . Hyperlipidemia   . Hypertension   . MRSA (methicillin resistant Staphylococcus aureus)    skin infections  . Rheumatoid arthritis(714.0)    transaminitis on MTX, remicade rxn, s/p sulfasalazine, orencia  . Ulcer disease      Social History   Socioeconomic History  . Marital status: Widowed    Spouse name: Not on file  . Number of children: 1  . Years of education: Not on file  . Highest education level: Not on file  Social Needs  . Financial resource strain: Not on file  . Food insecurity - worry: Not on file  . Food insecurity - inability: Not on file  . Transportation needs - medical: Not on file  . Transportation needs - non-medical: Not on file  Occupational History  . Not on file  Tobacco Use  . Smoking status: Former Smoker    Last attempt to quit: 12/30/1988    Years since quitting: 29.2  . Smokeless  tobacco: Never Used  Substance and Sexual Activity  . Alcohol use: No    Alcohol/week: 0.0 oz  . Drug use: No  . Sexual activity: Yes  Other Topics Concern  . Not on file  Social History Narrative   Enjoys dancing     Past Surgical History:  Procedure Laterality Date  . CHOLECYSTECTOMY  2010  . INTRACRANIAL ANEURYSM REPAIR  05/29/2016  . MCP replacements  2001   left  . TUBAL LIGATION      Family History  Problem Relation Age of Onset  . Heart disease Mother   . Diabetes Mother   . Hypercholesterolemia Mother   . Heart disease Father   . Heart disease Brother        MI age 58  . Hypercholesterolemia Sister   . Cancer Sister   . Colon polyps Sister   . Arthritis/Rheumatoid Paternal Aunt   . Arthritis/Rheumatoid Paternal Uncle   . Breast cancer Neg Hx     Allergies  Allergen Reactions  . Oxycodone Nausea Only  . Remicade [Infliximab] Other (See Comments)    Lightheadedness  . Simvastatin   . Sulfa Antibiotics   . Zetia [Ezetimibe] Other (See Comments)    Muscle aches   . Latex Rash  . Tape Rash    Current Outpatient Medications on File Prior to Visit  Medication Sig Dispense Refill  . abatacept [facility])  250 MG injection Once every month    . albuterol (PROVENTIL HFA;VENTOLIN HFA) 108 (90 BASE) MCG/ACT inhaler Inhale 2 puffs into the lungs every 6 (six) hours as needed for wheezing or shortness of breath. 1 Inhaler 2  . amLODipine (NORVASC) 2.5 MG tablet TAKE 1 TABLET BY MOUTH EVERY DAY 90 tablet 1  . aspirin 81 MG chewable tablet Chew 81 mg by mouth daily.    . calcium gluconate 650 MG tablet Take 650 mg by mouth daily.    . cholecalciferol (VITAMIN D) 1000 UNITS tablet Take 1,000 Units by mouth daily.    . dabigatran (PRADAXA) 75 MG CAPS capsule Take 75 mg by mouth 2 (two) times daily.    Marland Kitchen ELDERBERRY PO Take by mouth daily.    . fluticasone (FLONASE) 50 MCG/ACT nasal spray Place 1-2 sprays into both nostrils daily as needed. 16 g 2  . losartan (COZAAR)  100 MG tablet TAKE 1 TABLET BY MOUTH EVERY DAY 90 tablet 1  . lovastatin (MEVACOR) 10 MG tablet TAKE 1 TABLET (10 MG TOTAL) BY MOUTH AT BEDTIME. 30 tablet 5  . magnesium oxide (MAG-OX) 400 MG tablet Take 400 mg by mouth daily.    . methotrexate (RHEUMATREX) 2.5 MG tablet Take 2.5 mg by mouth once a week.     . pantoprazole (PROTONIX) 40 MG tablet TAKE 1 TABLET BY MOUTH EVERY DAY 30 tablet 2  . valACYclovir (VALTREX) 1000 MG tablet Take 1 tablet (1,000 mg total) by mouth 2 (two) times daily. 20 tablet 0   No current facility-administered medications on file prior to visit.     BP 122/82   Pulse 84   Temp 98.2 F (36.8 C) (Oral)   Ht 4\' 11"  (1.499 m)   Wt 109 lb 12 oz (49.8 kg)   SpO2 97%   BMI 22.17 kg/m    Objective:   Physical Exam  Constitutional: She appears well-nourished. She does not appear ill.  HENT:  Right Ear: Tympanic membrane and ear canal normal.  Left Ear: Tympanic membrane and ear canal normal.  Nose: Right sinus exhibits no maxillary sinus tenderness and no frontal sinus tenderness. Left sinus exhibits no maxillary sinus tenderness and no frontal sinus tenderness.  Mouth/Throat: Oropharynx is clear and moist.  Eyes: Conjunctivae are normal.  Neck: Neck supple.  Cardiovascular: Normal rate and regular rhythm.  Pulmonary/Chest: Effort normal and breath sounds normal. She has no wheezes. She has no rales.  Lymphadenopathy:    She has no cervical adenopathy.  Skin: Skin is warm and dry.          Assessment & Plan:  URI:  Cough, post nasal drip, hoarse voice x 2 days. Exam today overall unremarkable. She doesn't appear sickly, lungs are clear despite cough, vitals are normal. Do suspect viral involvement at this point, again she appears well and exam was unremarkable. She declines any Rx treatment for cough.  Will have her continue cough drops. Discussed use of albuterol if needed. Also discussed that she may feel worse before feeling better, but also  provided strict return precautions. She verbalized understanding.   , NP

## 2018-03-16 NOTE — Patient Instructions (Signed)
Your symptoms are representative of a viral illness which will resolve on its own over time. Our goal is to treat your symptoms in order to aid your body in the healing process and to make you more comfortable.   Continue using cough drops as discussed.  Please call me Friday this week if your symptoms progress, you start running fevers of 101 or higher, you start coughing up green mucous.  It was a pleasure meeting you!   Upper Respiratory Infection, Adult Most upper respiratory infections (URIs) are caused by a virus. A URI affects the nose, throat, and upper air passages. The most common type of URI is often called "the common cold." Follow these instructions at home:  Take medicines only as told by your doctor.  Gargle warm saltwater or take cough drops to comfort your throat as told by your doctor.  Use a warm mist humidifier or inhale steam from a shower to increase air moisture. This may make it easier to breathe.  Drink enough fluid to keep your pee (urine) clear or pale yellow.  Eat soups and other clear broths.  Have a healthy diet.  Rest as needed.  Go back to work when your fever is gone or your doctor says it is okay. ? You may need to stay home longer to avoid giving your URI to others. ? You can also wear a face mask and wash your hands often to prevent spread of the virus.  Use your inhaler more if you have asthma.  Do not use any tobacco products, including cigarettes, chewing tobacco, or electronic cigarettes. If you need help quitting, ask your doctor. Contact a doctor if:  You are getting worse, not better.  Your symptoms are not helped by medicine.  You have chills.  You are getting more short of breath.  You have Mizrahi or red mucus.  You have yellow or Benedict discharge from your nose.  You have pain in your face, especially when you bend forward.  You have a fever.  You have puffy (swollen) neck glands.  You have pain while swallowing.  You  have white areas in the back of your throat. Get help right away if:  You have very bad or constant: ? Headache. ? Ear pain. ? Pain in your forehead, behind your eyes, and over your cheekbones (sinus pain). ? Chest pain.  You have long-lasting (chronic) lung disease and any of the following: ? Wheezing. ? Long-lasting cough. ? Coughing up blood. ? A change in your usual mucus.  You have a stiff neck.  You have changes in your: ? Vision. ? Hearing. ? Thinking. ? Mood. This information is not intended to replace advice given to you by your health care provider. Make sure you discuss any questions you have with your health care provider. Document Released: 06/03/2008 Document Revised: 08/18/2016 Document Reviewed: 03/23/2014 Elsevier Interactive Patient Education  2018 [employer].

## 2018-03-31 DIAGNOSIS — M0579 Rheumatoid arthritis with rheumatoid factor of multiple sites without organ or systems involvement: Secondary | ICD-10-CM | POA: Diagnosis not present

## 2018-04-17 DIAGNOSIS — E871 Hypo-osmolality and hyponatremia: Secondary | ICD-10-CM | POA: Diagnosis not present

## 2018-04-21 ENCOUNTER — Other Ambulatory Visit: Payer: Self-pay | Admitting: Internal Medicine

## 2018-04-22 ENCOUNTER — Other Ambulatory Visit: Payer: Self-pay | Admitting: Internal Medicine

## 2018-04-22 DIAGNOSIS — Z1231 Encounter for screening mammogram for malignant neoplasm of breast: Secondary | ICD-10-CM

## 2018-04-24 ENCOUNTER — Ambulatory Visit: Payer: Medicare HMO | Admitting: Internal Medicine

## 2018-04-24 DIAGNOSIS — R918 Other nonspecific abnormal finding of lung field: Secondary | ICD-10-CM

## 2018-04-24 DIAGNOSIS — Z9889 Other specified postprocedural states: Secondary | ICD-10-CM | POA: Diagnosis not present

## 2018-04-24 DIAGNOSIS — R739 Hyperglycemia, unspecified: Secondary | ICD-10-CM

## 2018-04-24 DIAGNOSIS — Z8679 Personal history of other diseases of the circulatory system: Secondary | ICD-10-CM

## 2018-04-24 DIAGNOSIS — M069 Rheumatoid arthritis, unspecified: Secondary | ICD-10-CM | POA: Diagnosis not present

## 2018-04-24 DIAGNOSIS — I10 Essential (primary) hypertension: Secondary | ICD-10-CM

## 2018-04-24 DIAGNOSIS — F439 Reaction to severe stress, unspecified: Secondary | ICD-10-CM

## 2018-04-24 DIAGNOSIS — J449 Chronic obstructive pulmonary disease, unspecified: Secondary | ICD-10-CM

## 2018-04-24 DIAGNOSIS — E871 Hypo-osmolality and hyponatremia: Secondary | ICD-10-CM | POA: Diagnosis not present

## 2018-04-24 DIAGNOSIS — E7800 Pure hypercholesterolemia, unspecified: Secondary | ICD-10-CM | POA: Diagnosis not present

## 2018-04-24 DIAGNOSIS — R634 Abnormal weight loss: Secondary | ICD-10-CM | POA: Diagnosis not present

## 2018-04-24 DIAGNOSIS — I700 Atherosclerosis of aorta: Secondary | ICD-10-CM | POA: Diagnosis not present

## 2018-04-24 NOTE — Progress Notes (Signed)
Patient ID: Erica Little, female   DOB: 1946/08/31, 72 y.o.   MRN: [id number]   Subjective:    Patient ID: Erica Little, female    DOB: September 14, 1946, 72 y.o.   MRN: [id number]  HPI  Patient here for a scheduled follow up.  Increased stress recently with the unexpected death of her son.  Discussed with her today.  She feels she is handling things relatively well.  Discussed counseling.  She is going to think about this.  Has good support.  States she has been doing relatively well.  Stays active.  No chest pain. No sob. No acid reflux. No abdominal pain.  Bowels moving.  Has lost some weight.  Was not eating as much.  Starting to eat more now.  Added some nutritional supplements.  Due to f/u with repeat labs and nephrology next week.  She is walking.  Also working out with some weights.     Past Medical History:  Diagnosis Date  . Family history of brain aneurysm   . GERD (gastroesophageal reflux disease)   . Hyperlipidemia   . Hypertension   . MRSA (methicillin resistant Staphylococcus aureus)    skin infections  . Rheumatoid arthritis(714.0)    transaminitis on MTX, remicade rxn, s/p sulfasalazine, orencia  . Ulcer disease    Past Surgical History:  Procedure Laterality Date  . CHOLECYSTECTOMY  2010  . INTRACRANIAL ANEURYSM REPAIR  05/29/2016  . MCP replacements  2001   left  . TUBAL LIGATION     Family History  Problem Relation Age of Onset  . Heart disease Mother   . Diabetes Mother   . Hypercholesterolemia Mother   . Heart disease Father   . Heart disease Brother        MI age 57  . Hypercholesterolemia Sister   . Cancer Sister   . Colon polyps Sister   . Arthritis/Rheumatoid Paternal Aunt   . Arthritis/Rheumatoid Paternal Uncle   . Breast cancer Neg Hx    Social History   Socioeconomic History  . Marital status: Widowed    Spouse name: Not on file  . Number of children: 1  . Years of education: Not on file  . Highest education level: Not on file  Occupational  History  . Not on file  Social Needs  . Financial resource strain: Not on file  . Food insecurity:    Worry: Not on file    Inability: Not on file  . Transportation needs:    Medical: Not on file    Non-medical: Not on file  Tobacco Use  . Smoking status: Former Smoker    Last attempt to quit: 12/30/1988    Years since quitting: 29.3  . Smokeless tobacco: Never Used  Substance and Sexual Activity  . Alcohol use: No    Alcohol/week: 0.0 oz  . Drug use: No  . Sexual activity: Yes  Lifestyle  . Physical activity:    Days per week: Not on file    Minutes per session: Not on file  . Stress: Not on file  Relationships  . Social connections:    Talks on phone: Not on file    Gets together: Not on file    Attends religious service: Not on file    Active member of club or organization: Not on file    Attends meetings of clubs or organizations: Not on file    Relationship status: Not on file  Other Topics Concern  . Not on file  Social History Narrative   Enjoys dancing     Outpatient Encounter Medications as of 04/24/2018  Medication Sig  . abatacept (ORENCIA) 250 MG injection Once every month  . albuterol (PROVENTIL HFA;VENTOLIN HFA) 108 (90 BASE) MCG/ACT inhaler Inhale 2 puffs into the lungs every 6 (six) hours as needed for wheezing or shortness of breath.  Marland Kitchen amLODipine (NORVASC) 2.5 MG tablet TAKE 1 TABLET BY MOUTH EVERY DAY  . aspirin 81 MG chewable tablet Chew 81 mg by mouth daily.  . calcium gluconate 650 MG tablet Take 650 mg by mouth daily.  . cholecalciferol (VITAMIN D) 1000 UNITS tablet Take 1,000 Units by mouth daily.  . dabigatran (PRADAXA) 75 MG CAPS capsule Take 75 mg by mouth 2 (two) times daily.  Marland Kitchen ELDERBERRY PO Take by mouth daily.  . fluticasone (FLONASE) 50 MCG/ACT nasal spray Place 1-2 sprays into both nostrils daily as needed.  Marland Kitchen losartan (COZAAR) 100 MG tablet TAKE 1 TABLET BY MOUTH EVERY DAY  . lovastatin (MEVACOR) 10 MG tablet TAKE 1 TABLET (10 MG  TOTAL) BY MOUTH AT BEDTIME.  . magnesium oxide (MAG-OX) 400 MG tablet Take 400 mg by mouth daily.  . methotrexate (RHEUMATREX) 2.5 MG tablet Take 2.5 mg by mouth once a week.   . methotrexate 50 MG/2ML injection INJECT 0.2MLS SUBCUTANEOUSLY WEEKLY DISPENSE 2 VIALS  . pantoprazole (PROTONIX) 40 MG tablet TAKE 1 TABLET BY MOUTH EVERY DAY  . valACYclovir (VALTREX) 1000 MG tablet Take 1 tablet (1,000 mg total) by mouth 2 (two) times daily.   No facility-administered encounter medications on file as of 04/24/2018.     Review of Systems  Constitutional:       Has lost some weight recently.  She is eating some better now.    HENT: Negative for congestion and sinus pressure.   Respiratory: Negative for cough, chest tightness and shortness of breath.   Cardiovascular: Negative for chest pain, palpitations and leg swelling.  Gastrointestinal: Negative for abdominal pain, diarrhea, nausea and vomiting.  Genitourinary: Negative for difficulty urinating and dysuria.  Musculoskeletal: Negative for myalgias.       Joints - stable.    Skin: Negative for color change and rash.  Neurological: Negative for dizziness, light-headedness and headaches.  Psychiatric/Behavioral: Negative for agitation and dysphoric mood.       Increased stress as outlined.         Objective:     Blood pressure rechecked by me:  128/78  Physical Exam  Constitutional: She appears well-developed and well-nourished. No distress.  HENT:  Nose: Nose normal.  Mouth/Throat: Oropharynx is clear and moist.  Neck: Neck supple. No thyromegaly present.  Cardiovascular: Normal rate and regular rhythm.  Pulmonary/Chest: Breath sounds normal. No respiratory distress. She has no wheezes.  Abdominal: Soft. Bowel sounds are normal. There is no tenderness.  Musculoskeletal: She exhibits no edema or tenderness.  Lymphadenopathy:    She has no cervical adenopathy.  Skin: No rash noted. No erythema.  Psychiatric: She has a normal mood  and affect. Her behavior is normal.    BP 132/74 (BP Location: Left Arm, Patient Position: Sitting, Cuff Size: Normal)   Pulse 66   Temp 98 F (36.7 C) (Oral)   Resp 18   Wt 108 lb (49 kg)   SpO2 95%   BMI 21.81 kg/m  Wt Readings from Last 3 Encounters:  04/24/18 108 lb (49 kg)  03/16/18 109 lb 12 oz (49.8 kg)  01/22/18 110 lb 9.6 oz (50.2 kg)  Lab Results  Component Value Date   WBC 5.8 01/21/2018   HGB 13.4 01/21/2018   HCT 40.5 01/21/2018   PLT 194.0 01/21/2018   GLUCOSE 88 01/21/2018   CHOL 173 01/21/2018   TRIG 109.0 01/21/2018   HDL 45.40 01/21/2018   LDLDIRECT 167.8 12/20/2013   LDLCALC 106 (H) 01/21/2018   ALT 15 01/21/2018   AST 24 01/21/2018   NA 128 (L) 02/05/2018   K 4.7 01/21/2018   CL 97 01/21/2018   CREATININE 0.85 01/21/2018   BUN 10 01/21/2018   CO2 25 01/21/2018   TSH 2.61 10/13/2017   INR 1.1 (H) 12/06/2013   HGBA1C 5.7 01/21/2018    Ct Chest High Resolution  Result Date: 11/18/2017 CLINICAL DATA:  72 year old female with history of rheumatoid arthritis presenting with chronic cough. Former smoker. Evaluate for interstitial lung disease. EXAM: CT CHEST WITHOUT CONTRAST TECHNIQUE: Multidetector CT imaging of the chest was performed following the standard protocol without intravenous contrast. High resolution imaging of the lungs, as well as inspiratory and expiratory imaging, was performed. COMPARISON:  No priors. FINDINGS: Cardiovascular: Heart size is normal. There is no significant pericardial fluid, thickening or pericardial calcification. There is aortic atherosclerosis, as well as atherosclerosis of the great vessels of the mediastinum and the coronary arteries, including calcified atherosclerotic plaque in the left main, left anterior descending, left circumflex and right coronary arteries. Mediastinum/Nodes: No pathologically enlarged mediastinal or hilar lymph nodes. Please note that accurate exclusion of hilar adenopathy is limited on  noncontrast CT scans. Esophagus is unremarkable in appearance. No axillary lymphadenopathy. Lungs/Pleura: Several tiny calcified and noncalcified pulmonary nodules are noted in the lungs bilaterally. The calcified nodules are compatible with tiny calcified granulomas. The largest noncalcified solid-appearing nodule is in the lateral segment of the right middle lobe (axial image 79 of series 3) measuring 4 mm. The largest ground-glass attenuation nodule is in the left lower lobe measuring 8 x 6 mm (axial image 77 of series 3). No other larger more suspicious appearing pulmonary nodules or masses are noted. High-resolution images demonstrate no other significant regions of ground-glass attenuation, subpleural reticulation, parenchymal banding, traction bronchiectasis or frank honeycombing. Mild diffuse bronchial wall thickening. Mild bilateral apical nodular pleuroparenchymal thickening, most compatible with mild post infectious or inflammatory scarring. Inspiratory and expiratory imaging is unremarkable. Upper Abdomen: Status post cholecystectomy. Several small low-attenuation lesions scattered throughout the liver, incompletely characterized on today's noncontrast CT examination, but statistically likely to represent tiny cysts. Aortic atherosclerosis. Musculoskeletal: There are no aggressive appearing lytic or blastic lesions noted in the visualized portions of the skeleton. IMPRESSION: 1. No findings to suggest interstitial lung disease. 2. Mild diffuse bronchial wall thickening which may suggest chronic bronchitis. 3. Multiple tiny pulmonary nodules scattered throughout both lungs. The largest of these is a ground-glass attenuation nodule in the left lower lobe measuring 8 x 6 mm (mean diameter 7 mm). Initial follow-up with CT at 6-12 months is recommended to confirm persistence. If nodules persist, subsequent management will be based upon the most suspicious nodule(s). This recommendation follows the consensus  statement: Guidelines for Management of Incidental Pulmonary Nodules Detected on CT Images: From the Fleischner Society 2017; Radiology 2017; 284:228-243. 4. Aortic atherosclerosis, in addition to left main and 3 vessel coronary artery disease. Assessment for potential risk factor modification, dietary therapy or pharmacologic therapy may be warranted, if clinically indicated. 5. Additional incidental findings, as above. Aortic Atherosclerosis (ICD10-I70.0). Electronically Signed   By: Vinnie Langton M.D.   On: 11/18/2017 17:06  Assessment & Plan:   Problem List Items Addressed This Visit    Aortic atherosclerosis ([place])    On lovastatin.        COPD with chronic bronchitis and emphysema ([place])    Has been evaluated by pulmonary.  Breathing stable currently.        History of cerebral aneurysm     S/p stenting.  Being followed at [facility].  On plavix.  Following with f/u MRA in one year.  Continue lifelong aspirin.        Hypercholesteremia    On lovastatin.  Low cholesterol diet and exercise.  Follow lipid panel and liver function tests.        Hyperglycemia    Low carb diet and exercise.  Follow met b and a1c.       Hypertension    Blood pressure under good control.  Continue same medication regimen.  Follow pressures.  Follow metabolic panel.        Hyponatremia    Persistent low sodium.  Was referred to nephrology.  Concern regarding her decreased po intake.  Planning for f/u sodium and labs next week.        Lung nodules    CT scan as outlined.  Saw pulmonary.  Recommended f/u CT scan in 6 months.  CT scheduled for 05/19/18.        Rheumatoid arthritis ([place])    On MTX and orencia.  Followed by Dr Jefm Bryant.       Relevant Medications   methotrexate 50 MG/2ML injection   Stress    Increased stress as outlined.  Discussed with her today.  Trying to cope with the recent unexpected death of her son.  She has good support.  Does not feel she needs  anything more.  Will think about counseling.        Weight loss    Has lost some weight with recent stress of losing her son.  She is eating and trying to add nutritional supplements.  Follow.            Einar Pheasant, MD

## 2018-04-26 ENCOUNTER — Encounter: Payer: Self-pay | Admitting: Internal Medicine

## 2018-04-26 DIAGNOSIS — F439 Reaction to severe stress, unspecified: Secondary | ICD-10-CM | POA: Insufficient documentation

## 2018-04-26 NOTE — Assessment & Plan Note (Signed)
Blood pressure under good control.  Continue same medication regimen.  Follow pressures.  Follow metabolic panel.   

## 2018-04-26 NOTE — Assessment & Plan Note (Addendum)
CT scan as outlined.  Saw pulmonary.  Recommended f/u CT scan in 6 months.  CT scheduled for 05/19/18.

## 2018-04-26 NOTE — Assessment & Plan Note (Signed)
On lovastatin 

## 2018-04-26 NOTE — Assessment & Plan Note (Signed)
Has lost some weight with recent stress of losing her son.  She is eating and trying to add nutritional supplements.  Follow.

## 2018-04-26 NOTE — Assessment & Plan Note (Signed)
Increased stress as outlined.  Discussed with her today.  Trying to cope with the recent unexpected death of her son.  She has good support.  Does not feel she needs anything more.  Will think about counseling.

## 2018-04-26 NOTE — Assessment & Plan Note (Signed)
Has been evaluated by pulmonary.  Breathing stable currently.

## 2018-04-26 NOTE — Assessment & Plan Note (Signed)
Low carb diet and exercise.  Follow met b and a1c.  

## 2018-04-26 NOTE — Assessment & Plan Note (Signed)
On MTX and orencia.  Followed by Dr Gavin Potters.

## 2018-04-26 NOTE — Assessment & Plan Note (Signed)
Persistent low sodium.  Was referred to nephrology.  Concern regarding her decreased po intake.  Planning for f/u sodium and labs next week.

## 2018-04-26 NOTE — Assessment & Plan Note (Signed)
On lovastatin.  Low cholesterol diet and exercise.  Follow lipid panel and liver function tests.   

## 2018-04-26 NOTE — Assessment & Plan Note (Signed)
S/p stenting.  Being followed at [facility].  On plavix.  Following with f/u MRA in one year.  Continue lifelong aspirin.

## 2018-04-28 DIAGNOSIS — E871 Hypo-osmolality and hyponatremia: Secondary | ICD-10-CM | POA: Diagnosis not present

## 2018-04-28 DIAGNOSIS — M0579 Rheumatoid arthritis with rheumatoid factor of multiple sites without organ or systems involvement: Secondary | ICD-10-CM | POA: Diagnosis not present

## 2018-04-28 DIAGNOSIS — Z79899 Other long term (current) drug therapy: Secondary | ICD-10-CM | POA: Diagnosis not present

## 2018-05-19 ENCOUNTER — Ambulatory Visit
Admission: RE | Admit: 2018-05-19 | Discharge: 2018-05-19 | Disposition: A | Discharge status: Home / Self Care | Payer: Medicare HMO | Source: Ambulatory Visit | Attending: Internal Medicine | Admitting: Internal Medicine

## 2018-05-19 DIAGNOSIS — I700 Atherosclerosis of aorta: Secondary | ICD-10-CM | POA: Insufficient documentation

## 2018-05-19 DIAGNOSIS — R918 Other nonspecific abnormal finding of lung field: Secondary | ICD-10-CM

## 2018-05-26 ENCOUNTER — Ambulatory Visit: Payer: Medicare HMO | Admitting: Internal Medicine

## 2018-05-26 ENCOUNTER — Encounter: Payer: Self-pay | Admitting: Internal Medicine

## 2018-05-26 DIAGNOSIS — R918 Other nonspecific abnormal finding of lung field: Secondary | ICD-10-CM | POA: Diagnosis not present

## 2018-05-26 NOTE — Progress Notes (Signed)
[facility] [place] Pulmonary Medicine Consultation      Assessment and Plan:  Chronic cough. -This is been present for approximately 5 years, has not responded to typical empiric therapies during that time. -May be related to history of vocal cord nodules -At this time patient feels that the cough is adequately controlled, she does not desire any further interventions or testing.  Lung nodules. -Most are 4 mm or smaller, there is one groundglass nodule up to 8 mm in diameter.  These appear to be low risk for cancer, particularly given her history of rheumatoid arthritis. -We will repeat CT chest without contrast in 6 months time.  Rheumatoid arthritis. -Chronic interstitial changes seen on chest x-ray, however this is not seen on high resolution CT chest or appreciated on physical examination.  COPD/emphysema -Confirmed on pulmonary function testing, however this appears to be very mild with an FEV1 of 100%, and no daily symptoms. --Prevnar 1311/26/18.    Date: 05/26/2018  MRN# [id number] CHRISHANNA Little 1946/07/09    Erica Little is a 72 y.o. old female seen in consultation for chief complaint of:    Chief Complaint  Patient presents with  . Cough    pt states cough is stable.    HPI:   The patient is here for a chronic cough which has been present for about 5 years. She saw Dr. Meredeth Ide years ago, was tried on advair but made it hard for her to breathe, so she stopped it. The cough is dry, she does not recall trying other things. She was a previous smoker, last in 1990's.  She had a chest x-ray which was suggestive of interstitial lung disease, however upon performing a high-resolution CT no evidence of ILD was found.  There were some tiny nodules.  She feels that her cough is better and her breathing is normal. She feels that still has a bit of a cough but it is better. She is able to sleep through the night and does not feel that the cough is bothersome currently. The cough  originates in her throat.   **Imaging personally reviewed, CT chest high-resolution 11/18/17, noncontrasted CT chest 05/19/2018; no evidence of interstitial lung disease, there are several tiny lung nodules, the largest of which is an 8 mm groundglass nodule, there are solid nodules which are 4 mm or less.  Review of the most recent scans shows no significant change from previous, largest nodule in the right middle lobe is 4 mm, largest groundglass nodule in the left lower lobe is 8 mm.  PFT 11/18/17. Spirometry FVC 126% of predicted, FEV1 equals 101%, ratio was 65%, there was no bronchodilator administered. Lung volumes show TLC of 135%, vital capacity 126%.  RV to TLC ratio is normal. DLCO is normal at 95%.  Flow volume loop suggests obstruction.  Overall this test is suggestive of mild COPD.    Social Hx:   Social History   Tobacco Use  . Smoking status: Former Smoker    Last attempt to quit: 12/30/1988    Years since quitting: 29.4  . Smokeless tobacco: Never Used  Substance Use Topics  . Alcohol use: No    Alcohol/week: 0.0 oz  . Drug use: No   Medication:    Current Outpatient Medications:  .  abatacept (ORENCIA) 250 MG injection, Once every month, Disp: , Rfl:  .  albuterol (PROVENTIL HFA;VENTOLIN HFA) 108 (90 BASE) MCG/ACT inhaler, Inhale 2 puffs into the lungs every 6 (six) hours as needed for wheezing  or shortness of breath., Disp: 1 Inhaler, Rfl: 2 .  amLODipine (NORVASC) 2.5 MG tablet, TAKE 1 TABLET BY MOUTH EVERY DAY, Disp: 90 tablet, Rfl: 1 .  aspirin 81 MG chewable tablet, Chew 81 mg by mouth daily., Disp: , Rfl:  .  calcium gluconate 650 MG tablet, Take 650 mg by mouth daily., Disp: , Rfl:  .  cholecalciferol (VITAMIN D) 1000 UNITS tablet, Take 1,000 Units by mouth daily., Disp: , Rfl:  .  dabigatran (PRADAXA) 75 MG CAPS capsule, Take 75 mg by mouth 2 (two) times daily., Disp: , Rfl:  .  ELDERBERRY PO, Take by mouth daily., Disp: , Rfl:  .  fluticasone (FLONASE) 50  MCG/ACT nasal spray, Place 1-2 sprays into both nostrils daily as needed., Disp: 16 g, Rfl: 2 .  losartan (COZAAR) 100 MG tablet, TAKE 1 TABLET BY MOUTH EVERY DAY, Disp: 90 tablet, Rfl: 1 .  lovastatin (MEVACOR) 10 MG tablet, TAKE 1 TABLET (10 MG TOTAL) BY MOUTH AT BEDTIME., Disp: 30 tablet, Rfl: 5 .  magnesium oxide (MAG-OX) 400 MG tablet, Take 400 mg by mouth daily., Disp: , Rfl:  .  methotrexate (RHEUMATREX) 2.5 MG tablet, Take 2.5 mg by mouth once a week. , Disp: , Rfl:  .  methotrexate 50 MG/2ML injection, INJECT 0.2MLS SUBCUTANEOUSLY WEEKLY DISPENSE 2 VIALS, Disp: , Rfl: 1 .  pantoprazole (PROTONIX) 40 MG tablet, TAKE 1 TABLET BY MOUTH EVERY DAY, Disp: 30 tablet, Rfl: 2 .  valACYclovir (VALTREX) 1000 MG tablet, Take 1 tablet (1,000 mg total) by mouth 2 (two) times daily., Disp: 20 tablet, Rfl: 0   Allergies:  Oxycodone; Remicade [infliximab]; Simvastatin; Sulfa antibiotics; Zetia [ezetimibe]; Latex; and Tape  Review of Systems: Gen:  Denies  fever, sweats, chills HEENT: Denies blurred vision, double vision. bleeds, sore throat Cvc:  No dizziness, chest pain. Resp:   Denies  shortness of breath Gi: Denies swallowing difficulty, stomach pain. Gu:  Denies bladder incontinence, burning urine Ext:   No Joint pain, stiffness. Skin: No skin rash,  hives  Endoc:  No polyuria, polydipsia. Psych: No depression, insomnia. Other:  All other systems were reviewed with the patient and were negative other that what is mentioned in the HPI.   Physical Examination:   VS: BP 112/70 (BP Location: Left Arm, Cuff Size: Normal)   Pulse 66   Resp 16   Ht 4\' 11"  (1.499 m)   Wt 108 lb (49 kg)   SpO2 100%   BMI 21.81 kg/m   General Appearance: No distress  Neuro:without focal findings,  speech normal,  HEENT: PERRLA, EOM intact.   Pulmonary: normal breath sounds, No wheezing.  CardiovascularNormal S1,S2.  No m/r/g.   Abdomen: Benign, Soft, non-tender. Renal:  No costovertebral tenderness  GU:   No performed at this time. Endoc: No evident thyromegaly, no signs of acromegaly. Skin:   warm, no rashes, no ecchymosis  Extremities: normal, no cyanosis, clubbing.  Other findings:    LABORATORY PANEL:   CBC No results for input(s): WBC, HGB, HCT, PLT in the last 168 hours. ------------------------------------------------------------------------------------------------------------------  Chemistries  No results for input(s): NA, K, CL, CO2, GLUCOSE, BUN, CREATININE, CALCIUM, MG, AST, ALT, ALKPHOS, BILITOT in the last 168 hours.  Invalid input(s): GFRCGP ------------------------------------------------------------------------------------------------------------------  Cardiac Enzymes No results for input(s): TROPONINI in the last 168 hours. ------------------------------------------------------------  RADIOLOGY:  No results found.     Thank  you for the consultation and for allowing [facility] to assist in the care  of your patient. Our recommendations are noted above.  Please contact us if we can be of further service.   Wells Guiles, MD.  Board Certified in Internal Medicine, Pulmonary Medicine, Critical Care Medicine, and Sleep Medicine.  [facility] Office Number: [phone]  Santiago Glad, M.D.  Billy Fischer, M.D  05/26/2018

## 2018-05-26 NOTE — Patient Instructions (Signed)
Will check Ct chest in about 6 months and follow up after that.

## 2018-06-01 ENCOUNTER — Ambulatory Visit
Admission: RE | Admit: 2018-06-01 | Discharge: 2018-06-01 | Disposition: A | Discharge status: Home / Self Care | Payer: Medicare HMO | Source: Ambulatory Visit | Attending: Internal Medicine | Admitting: Internal Medicine

## 2018-06-01 DIAGNOSIS — Z1231 Encounter for screening mammogram for malignant neoplasm of breast: Secondary | ICD-10-CM

## 2018-06-02 DIAGNOSIS — M0579 Rheumatoid arthritis with rheumatoid factor of multiple sites without organ or systems involvement: Secondary | ICD-10-CM | POA: Diagnosis not present

## 2018-06-06 ENCOUNTER — Other Ambulatory Visit: Payer: Self-pay | Admitting: Internal Medicine

## 2018-06-10 ENCOUNTER — Ambulatory Visit: Payer: Self-pay

## 2018-06-10 DIAGNOSIS — J029 Acute pharyngitis, unspecified: Secondary | ICD-10-CM | POA: Diagnosis not present

## 2018-06-10 DIAGNOSIS — J208 Acute bronchitis due to other specified organisms: Secondary | ICD-10-CM | POA: Diagnosis not present

## 2018-06-10 NOTE — Telephone Encounter (Signed)
Noted.  Please confirm seen and doing ok.

## 2018-06-10 NOTE — Telephone Encounter (Signed)
Will follow up to confirm she was evaluated.

## 2018-06-10 NOTE — Telephone Encounter (Signed)
Pt c/o cough that started at 0200 this am . Pt denies fever, chest pain, or coughing up any blood. Pt stated that she has arthritis of the vocal cords and of her lungs. Pt sounds hoarse. Pt stated that she feels like she is "not breathing deep enough." Attempted to give home care advice but pt wants to see a provider today. No openings at [facility] or [place]. Called [employer] and no openings to work pt in. Offered to make appt for [place] provider in [place] but pt refused. Pt stated that she will go to [facility] to be evaluated.  Reason for Disposition . Cough    Pt wants to see provider today  Answer Assessment - Initial Assessment Questions 1. ONSET: "When did the cough begin?"      0200 2. SEVERITY: "How bad is the cough today?"       Pt did not sleep last night 3. RESPIRATORY DISTRESS: "Describe your breathing."      Not breathing deep enough 4. FEVER: "Do you have a fever?" If so, ask: "What is your temperature, how was it measured, and when did it start?"     no 5. HEMOPTYSIS: "Are you coughing up any blood?" If so ask: "How much?" (flecks, streaks, tablespoons, etc.)     no 6. TREATMENT: "What have you done so far to treat the cough?" (e.g., meds, fluids, humidifier)     No tx 7. CARDIAC HISTORY: "Do you have any history of heart disease?" (e.g., heart attack, congestive heart failure)      no 8. LUNG HISTORY: "Do you have any history of lung disease?"  (e.g., pulmonary embolus, asthma, emphysema)     "arthritis in lungs"  No other hx 9. PE RISK FACTORS: "Do you have a history of blood clots?" (or: recent major surgery, recent prolonged travel, bedridden )     no 10. OTHER SYMPTOMS: "Do you have any other symptoms? (e.g., runny nose, wheezing, chest pain)       none 11. PREGNANCY: "Is there any chance you are pregnant?" "When was your last menstrual period?"       n/a 12. TRAVEL: "Have you traveled out of the country in the last month?" (e.g., travel history,  exposures)       no  Protocols used: COUGH - ACUTE NON-PRODUCTIVE-A-AH

## 2018-06-11 DIAGNOSIS — Z8679 Personal history of other diseases of the circulatory system: Secondary | ICD-10-CM | POA: Diagnosis not present

## 2018-06-11 DIAGNOSIS — Z9889 Other specified postprocedural states: Secondary | ICD-10-CM | POA: Diagnosis not present

## 2018-06-11 DIAGNOSIS — I671 Cerebral aneurysm, nonruptured: Secondary | ICD-10-CM | POA: Diagnosis not present

## 2018-06-11 NOTE — Telephone Encounter (Signed)
Spoke with patient this AM, she was seen at walk-in yesterday afternoon and stated she is starting to feel better this morning. NO acute symptoms and advised to call us if she needs anything.

## 2018-06-15 DIAGNOSIS — E871 Hypo-osmolality and hyponatremia: Secondary | ICD-10-CM | POA: Diagnosis not present

## 2018-06-15 DIAGNOSIS — R809 Proteinuria, unspecified: Secondary | ICD-10-CM | POA: Diagnosis not present

## 2018-06-18 DIAGNOSIS — E871 Hypo-osmolality and hyponatremia: Secondary | ICD-10-CM | POA: Diagnosis not present

## 2018-06-24 DIAGNOSIS — R809 Proteinuria, unspecified: Secondary | ICD-10-CM | POA: Diagnosis not present

## 2018-06-24 DIAGNOSIS — E871 Hypo-osmolality and hyponatremia: Secondary | ICD-10-CM | POA: Diagnosis not present

## 2018-06-30 DIAGNOSIS — M0579 Rheumatoid arthritis with rheumatoid factor of multiple sites without organ or systems involvement: Secondary | ICD-10-CM | POA: Diagnosis not present

## 2018-07-01 DIAGNOSIS — H2513 Age-related nuclear cataract, bilateral: Secondary | ICD-10-CM | POA: Diagnosis not present

## 2018-07-18 ENCOUNTER — Other Ambulatory Visit: Payer: Self-pay | Admitting: Internal Medicine

## 2018-07-24 ENCOUNTER — Other Ambulatory Visit: Payer: Self-pay | Admitting: Internal Medicine

## 2018-07-28 DIAGNOSIS — Z79899 Other long term (current) drug therapy: Secondary | ICD-10-CM | POA: Diagnosis not present

## 2018-07-28 DIAGNOSIS — M0579 Rheumatoid arthritis with rheumatoid factor of multiple sites without organ or systems involvement: Secondary | ICD-10-CM | POA: Diagnosis not present

## 2018-08-13 ENCOUNTER — Ambulatory Visit: Payer: Medicare HMO | Admitting: Internal Medicine

## 2018-08-13 ENCOUNTER — Encounter: Payer: Self-pay | Admitting: Internal Medicine

## 2018-08-13 DIAGNOSIS — R0989 Other specified symptoms and signs involving the circulatory and respiratory systems: Secondary | ICD-10-CM | POA: Diagnosis not present

## 2018-08-13 DIAGNOSIS — E7800 Pure hypercholesterolemia, unspecified: Secondary | ICD-10-CM

## 2018-08-13 DIAGNOSIS — Z8679 Personal history of other diseases of the circulatory system: Secondary | ICD-10-CM

## 2018-08-13 DIAGNOSIS — I700 Atherosclerosis of aorta: Secondary | ICD-10-CM | POA: Diagnosis not present

## 2018-08-13 DIAGNOSIS — Z9889 Other specified postprocedural states: Secondary | ICD-10-CM

## 2018-08-13 DIAGNOSIS — J449 Chronic obstructive pulmonary disease, unspecified: Secondary | ICD-10-CM | POA: Diagnosis not present

## 2018-08-13 DIAGNOSIS — M069 Rheumatoid arthritis, unspecified: Secondary | ICD-10-CM | POA: Diagnosis not present

## 2018-08-13 DIAGNOSIS — R69 Illness, unspecified: Secondary | ICD-10-CM | POA: Diagnosis not present

## 2018-08-13 DIAGNOSIS — R739 Hyperglycemia, unspecified: Secondary | ICD-10-CM

## 2018-08-13 DIAGNOSIS — R634 Abnormal weight loss: Secondary | ICD-10-CM

## 2018-08-13 DIAGNOSIS — F439 Reaction to severe stress, unspecified: Secondary | ICD-10-CM

## 2018-08-13 DIAGNOSIS — R918 Other nonspecific abnormal finding of lung field: Secondary | ICD-10-CM

## 2018-08-13 DIAGNOSIS — I10 Essential (primary) hypertension: Secondary | ICD-10-CM | POA: Diagnosis not present

## 2018-08-13 NOTE — Progress Notes (Signed)
Patient ID: Erica Little, female   DOB: July 07, 1946, 72 y.o.   MRN: [id number]   Subjective:    Patient ID: Erica Little, female    DOB: 1946/09/15, 72 y.o.   MRN: [id number]  HPI  Patient here for a scheduled follow up.  She reports she is doing well.  Feels good.  Staying active.  Joints doing better.  Was adjusting her diet to lose some weight.  No chest pain.  No sob.  No acid reflux.  No abdominal pain.  Bowels moving.  Saw nephrology 03/2018 - f/u kidneys.  No changes made.  Saw pulmonary 04/2018.  Recommended f/u chest CT in 6 months.  Handling stress.  Overall she feels she is doing well.     Past Medical History:  Diagnosis Date  . Family history of brain aneurysm   . GERD (gastroesophageal reflux disease)   . Hyperlipidemia   . Hypertension   . MRSA (methicillin resistant Staphylococcus aureus)    skin infections  . Rheumatoid arthritis(714.0)    transaminitis on MTX, remicade rxn, s/p sulfasalazine, orencia  . Ulcer disease    Past Surgical History:  Procedure Laterality Date  . CHOLECYSTECTOMY  2010  . INTRACRANIAL ANEURYSM REPAIR  05/29/2016  . MCP replacements  2001   left  . TUBAL LIGATION     Family History  Problem Relation Age of Onset  . Heart disease Mother   . Diabetes Mother   . Hypercholesterolemia Mother   . Heart disease Father   . Heart disease Brother        MI age 51  . Hypercholesterolemia Sister   . Cancer Sister   . Colon polyps Sister   . Arthritis/Rheumatoid Paternal Aunt   . Arthritis/Rheumatoid Paternal Uncle   . Breast cancer Neg Hx    Social History   Socioeconomic History  . Marital status: Widowed    Spouse name: Not on file  . Number of children: 1  . Years of education: Not on file  . Highest education level: Not on file  Occupational History  . Not on file  Social Needs  . Financial resource strain: Not on file  . Food insecurity:    Worry: Not on file    Inability: Not on file  . Transportation needs:    Medical:  Not on file    Non-medical: Not on file  Tobacco Use  . Smoking status: Former Smoker    Last attempt to quit: 12/30/1988    Years since quitting: 29.6  . Smokeless tobacco: Never Used  Substance and Sexual Activity  . Alcohol use: No    Alcohol/week: 0.0 standard drinks  . Drug use: No  . Sexual activity: Yes  Lifestyle  . Physical activity:    Days per week: Not on file    Minutes per session: Not on file  . Stress: Not on file  Relationships  . Social connections:    Talks on phone: Not on file    Gets together: Not on file    Attends religious service: Not on file    Active member of club or organization: Not on file    Attends meetings of clubs or organizations: Not on file    Relationship status: Not on file  Other Topics Concern  . Not on file  Social History Narrative   Enjoys dancing     Outpatient Encounter Medications as of 08/13/2018  Medication Sig  . abatacept (ORENCIA) 250 MG injection Once every month  .  albuterol (PROVENTIL HFA;VENTOLIN HFA) 108 (90 BASE) MCG/ACT inhaler Inhale 2 puffs into the lungs every 6 (six) hours as needed for wheezing or shortness of breath.  Marland Kitchen amLODipine (NORVASC) 2.5 MG tablet TAKE 1 TABLET BY MOUTH EVERY DAY  . aspirin 81 MG chewable tablet Chew 81 mg by mouth daily.  . calcium gluconate 650 MG tablet Take 650 mg by mouth daily.  . cholecalciferol (VITAMIN D) 1000 UNITS tablet Take 1,000 Units by mouth daily.  . dabigatran (PRADAXA) 75 MG CAPS capsule Take 75 mg by mouth 2 (two) times daily.  Marland Kitchen ELDERBERRY PO Take by mouth daily.  . fluticasone (FLONASE) 50 MCG/ACT nasal spray Place 1-2 sprays into both nostrils daily as needed.  Marland Kitchen losartan (COZAAR) 100 MG tablet TAKE 1 TABLET BY MOUTH EVERY DAY  . lovastatin (MEVACOR) 10 MG tablet TAKE 1 TABLET BY MOUTH EVERYDAY AT BEDTIME  . magnesium oxide (MAG-OX) 400 MG tablet Take 400 mg by mouth daily.  . methotrexate (RHEUMATREX) 2.5 MG tablet Take 2.5 mg by mouth once a week.   .  methotrexate 50 MG/2ML injection INJECT 0.2MLS SUBCUTANEOUSLY WEEKLY DISPENSE 2 VIALS  . pantoprazole (PROTONIX) 40 MG tablet TAKE 1 TABLET BY MOUTH EVERY DAY  . valACYclovir (VALTREX) 1000 MG tablet Take 1 tablet (1,000 mg total) by mouth 2 (two) times daily.   No facility-administered encounter medications on file as of 08/13/2018.     Review of Systems  Constitutional: Negative for appetite change.       Has lost some weight.  Has adjusted her diet some.    HENT: Negative for congestion and sinus pressure.   Respiratory: Negative for cough, chest tightness and shortness of breath.   Cardiovascular: Negative for chest pain, palpitations and leg swelling.  Gastrointestinal: Negative for abdominal pain, diarrhea, nausea and vomiting.  Genitourinary: Negative for difficulty urinating and dysuria.  Musculoskeletal: Negative for myalgias.       Joints doing better.    Skin: Negative for color change and rash.  Neurological: Negative for dizziness, light-headedness and headaches.  Psychiatric/Behavioral: Negative for agitation and dysphoric mood.       Objective:    Physical Exam  Constitutional: She appears well-developed and well-nourished. No distress.  HENT:  Nose: Nose normal.  Mouth/Throat: Oropharynx is clear and moist.  Neck: Neck supple. No thyromegaly present.  Cardiovascular: Normal rate and regular rhythm.  Pulmonary/Chest: Breath sounds normal. No respiratory distress. She has no wheezes.  Abdominal: Soft. Bowel sounds are normal. There is no tenderness.  Musculoskeletal: She exhibits no edema or tenderness.  Lymphadenopathy:    She has no cervical adenopathy.  Skin: No rash noted. No erythema.  Psychiatric: She has a normal mood and affect. Her behavior is normal.    BP 126/76   Pulse 66   Temp 97.9 F (36.6 C) (Oral)   Ht 4' 11"  (1.499 m)   Wt 106 lb 3.2 oz (48.2 kg)   SpO2 98%   BMI 21.45 kg/m  Wt Readings from Last 3 Encounters:  08/13/18 106 lb 3.2 oz  (48.2 kg)  05/26/18 108 lb (49 kg)  04/24/18 108 lb (49 kg)     Lab Results  Component Value Date   WBC 5.8 01/21/2018   HGB 13.4 01/21/2018   HCT 40.5 01/21/2018   PLT 194.0 01/21/2018   GLUCOSE 88 01/21/2018   CHOL 173 01/21/2018   TRIG 109.0 01/21/2018   HDL 45.40 01/21/2018   LDLDIRECT 167.8 12/20/2013   LDLCALC 106 (H) 01/21/2018  ALT 15 01/21/2018   AST 24 01/21/2018   NA 128 (L) 02/05/2018   K 4.7 01/21/2018   CL 97 01/21/2018   CREATININE 0.85 01/21/2018   BUN 10 01/21/2018   CO2 25 01/21/2018   TSH 2.61 10/13/2017   INR 1.1 (H) 12/06/2013   HGBA1C 5.7 01/21/2018    Mm 3d Screen Breast Bilateral  Result Date: 06/01/2018 CLINICAL DATA:  Screening. EXAM: DIGITAL SCREENING BILATERAL MAMMOGRAM WITH TOMO AND CAD COMPARISON:  Previous exam(s). ACR Breast Density Category b: There are scattered areas of fibroglandular density. FINDINGS: There are no findings suspicious for malignancy. Images were processed with CAD. IMPRESSION: No mammographic evidence of malignancy. A result letter of this screening mammogram will be mailed directly to the patient. RECOMMENDATION: Screening mammogram in one year. (Code:SM-B-01Y) BI-RADS CATEGORY  1: Negative. Electronically Signed   By: Curlene Dolphin M.D.   On: 06/01/2018 15:53       Assessment & Plan:   Problem List Items Addressed This Visit    Aortic atherosclerosis ([place])    On lovastatin.        Carotid bruit    Followed by [facility].        COPD with chronic bronchitis and emphysema ([place])    Has been evaluated by pulmonary.  Breathing stable.        History of cerebral aneurysm     S/p stenting.  Being followed by [facility].  On plavix.  Continue aspirin.        Hypercholesteremia    On lovastatin.  Follow lipid panel and liver function tests.        Relevant Orders   Hepatic function panel   Lipid panel   Hyperglycemia    Low carb diet and exercise.  Follow met b and a1c.        Relevant Orders    Hemoglobin A1c   Hypertension    Blood pressure under good control.  Continue same medication regimen.  Follow pressures.  Follow metabolic panel.        Relevant Orders   CBC with Differential/Platelet   Basic metabolic panel   Lung nodules    Followed by pulmonary.  Recommended f/u chest CT 6 months.  Last evaluated 04/2018.        Rheumatoid arthritis ([place])    On MTX and orencia.  Followed by Dr Jefm Bryant.       Stress    Still trying to cope with the death of her son.  Overall doing well.  Follow.        Weight loss    Has lost weight.  After discussion, has tried to lose weight.  Discussed importance of eating regular meals.  Follow.           Einar Pheasant, MD

## 2018-08-16 NOTE — Assessment & Plan Note (Signed)
Has been evaluated by pulmonary.  Breathing stable.  

## 2018-08-16 NOTE — Assessment & Plan Note (Signed)
On lovastatin.  Follow lipid panel and liver function tests.   

## 2018-08-16 NOTE — Assessment & Plan Note (Signed)
Still trying to cope with the death of her son.  Overall doing well.  Follow.

## 2018-08-16 NOTE — Assessment & Plan Note (Signed)
Blood pressure under good control.  Continue same medication regimen.  Follow pressures.  Follow metabolic panel.   

## 2018-08-16 NOTE — Assessment & Plan Note (Signed)
On lovastatin 

## 2018-08-16 NOTE — Assessment & Plan Note (Signed)
Followed by pulmonary.  Recommended f/u chest CT 6 months.  Last evaluated 04/2018.

## 2018-08-16 NOTE — Assessment & Plan Note (Signed)
S/p stenting.  Being followed by [facility].  On plavix.  Continue aspirin.

## 2018-08-16 NOTE — Assessment & Plan Note (Signed)
Low carb diet and exercise.  Follow met b and a1c.   

## 2018-08-16 NOTE — Assessment & Plan Note (Signed)
Followed by [facility].   

## 2018-08-16 NOTE — Assessment & Plan Note (Signed)
On MTX and orencia.  Followed by Dr Kernodle.  

## 2018-08-16 NOTE — Assessment & Plan Note (Signed)
Has lost weight.  After discussion, has tried to lose weight.  Discussed importance of eating regular meals.  Follow.

## 2018-08-25 DIAGNOSIS — R05 Cough: Secondary | ICD-10-CM | POA: Diagnosis not present

## 2018-08-25 DIAGNOSIS — M0579 Rheumatoid arthritis with rheumatoid factor of multiple sites without organ or systems involvement: Secondary | ICD-10-CM | POA: Diagnosis not present

## 2018-08-25 DIAGNOSIS — M79642 Pain in left hand: Secondary | ICD-10-CM | POA: Diagnosis not present

## 2018-08-27 ENCOUNTER — Other Ambulatory Visit (INDEPENDENT_AMBULATORY_CARE_PROVIDER_SITE_OTHER): Payer: Medicare HMO

## 2018-08-27 DIAGNOSIS — I10 Essential (primary) hypertension: Secondary | ICD-10-CM

## 2018-08-27 DIAGNOSIS — E7800 Pure hypercholesterolemia, unspecified: Secondary | ICD-10-CM

## 2018-08-27 DIAGNOSIS — R739 Hyperglycemia, unspecified: Secondary | ICD-10-CM

## 2018-08-27 LAB — HEPATIC FUNCTION PANEL
ALBUMIN: 4.3 g/dL (ref 3.5–5.2)
ALK PHOS: 76 U/L (ref 39–117)
ALT: 11 U/L (ref 0–35)
AST: 19 U/L (ref 0–37)
Bilirubin, Direct: 0.1 mg/dL (ref 0.0–0.3)
TOTAL PROTEIN: 6.7 g/dL (ref 6.0–8.3)
Total Bilirubin: 0.7 mg/dL (ref 0.2–1.2)

## 2018-08-27 LAB — CBC WITH DIFFERENTIAL/PLATELET
BASOS ABS: 0.1 10*3/uL (ref 0.0–0.1)
Basophils Relative: 1.7 % (ref 0.0–3.0)
EOS ABS: 0.8 10*3/uL — AB (ref 0.0–0.7)
Eosinophils Relative: 13.3 % — ABNORMAL HIGH (ref 0.0–5.0)
HCT: 39.5 % (ref 36.0–46.0)
HEMOGLOBIN: 13.1 g/dL (ref 12.0–15.0)
LYMPHS PCT: 35.8 % (ref 12.0–46.0)
Lymphs Abs: 2.1 10*3/uL (ref 0.7–4.0)
MCHC: 33.3 g/dL (ref 30.0–36.0)
MCV: 90.9 fl (ref 78.0–100.0)
MONO ABS: 0.4 10*3/uL (ref 0.1–1.0)
Monocytes Relative: 6.9 % (ref 3.0–12.0)
Neutro Abs: 2.4 10*3/uL (ref 1.4–7.7)
Neutrophils Relative %: 42.3 % — ABNORMAL LOW (ref 43.0–77.0)
Platelets: 206 10*3/uL (ref 150.0–400.0)
RBC: 4.34 Mil/uL (ref 3.87–5.11)
RDW: 15.5 % (ref 11.5–15.5)
WBC: 5.8 10*3/uL (ref 4.0–10.5)

## 2018-08-27 LAB — BASIC METABOLIC PANEL
BUN: 13 mg/dL (ref 6–23)
CALCIUM: 9.5 mg/dL (ref 8.4–10.5)
CO2: 25 mEq/L (ref 19–32)
CREATININE: 0.96 mg/dL (ref 0.40–1.20)
Chloride: 98 mEq/L (ref 96–112)
GFR: 60.44 mL/min (ref >60.00)
Glucose, Bld: 88 mg/dL (ref 70–99)
Potassium: 4.6 mEq/L (ref 3.5–5.1)
Sodium: 130 mEq/L — ABNORMAL LOW (ref 135–145)

## 2018-08-27 LAB — LIPID PANEL
CHOLESTEROL: 170 mg/dL (ref 0–200)
HDL: 47.5 mg/dL (ref >39.00)
LDL CALC: 98 mg/dL (ref 0–99)
NonHDL: 122.02
TRIGLYCERIDES: 118 mg/dL (ref 0.0–149.0)
Total CHOL/HDL Ratio: 4
VLDL: 23.6 mg/dL (ref 0.0–40.0)

## 2018-08-27 LAB — HEMOGLOBIN A1C: Hgb A1c MFr Bld: 6 % (ref 4.6–6.5)

## 2018-09-16 ENCOUNTER — Encounter: Payer: Self-pay | Admitting: Internal Medicine

## 2018-09-16 DIAGNOSIS — E871 Hypo-osmolality and hyponatremia: Secondary | ICD-10-CM | POA: Diagnosis not present

## 2018-10-13 ENCOUNTER — Ambulatory Visit: Payer: Self-pay | Admitting: *Deleted

## 2018-10-13 NOTE — Telephone Encounter (Signed)
I returned call to pt.   She gets Orencia infusions for her rheumatoid arthritis.   She is due an infusion on Monday.   She is calling because she is having what looks like a herpes outbreak.   "It looks the same as the prior outbreak".    "I have also had shingles too but this does not look like that did".   The herpes outbreak is in the same area as the prior herpes outbreak.  Her rheumatologist saw the outbreak too and thinks it's the herpes also.   She wants to know if Dr. Lorin Picket wants to see her or not or can she just start the Acyclovir which she said she is going to do.   She is going to start the Acyclovir this evening because she wants to get the herpes under control as soon as possible since she is getting Orencia infusions.    She has a full prescription of Acyclovir 400 mg to take 3 times a day that Dr. Lorin Picket had prescribed for her in case of a new outbreak.  She is requesting a call from Dr. Aggie Moats letting her know Dr. Lorin Picket is aware of her situation and if she needs to come in  And see Dr. Lorin Picket or not.  I have routed a high priority  Note to Dr. Roby Lofts nurse pool for further disposition.   Reason for Disposition . Genital area rash    Having a herpes outbreak on left thigh and left knee, not in the genital area.   Has had an outbreak in these areas before.   Itching without pain.  Answer Assessment - Initial Assessment Questions 1. APPEARANCE of RASH: "Describe the rash."      Last night it came back on my leg.  It's itching.  It looks like herpes blisters.   I feel sure that's what it is.     I'm concerned it's herpes.   I'm for a infusion for my arthritis on Monday.   I would like to start taking the medication now if possible.   Not sure if herpes or shingles.    2. LOCATION: "Where is the rash located?"      On my leg in the same spot well kinda close to where on my knee and thigh on left side. 3. ONSET: "When did the rash start?"      Last night. 4. ITCHING: "Does  the rash itch?" If so, ask: "How bad is the itch?"  (Scale 1-10; or mild, moderate, severe)     Yes. 5. PAIN: "Does the rash hurt?" If so, ask: "How bad is the pain?"  (Scale 1-10; or mild, moderate, severe)     No pain. 6. OTHER SYMPTOMS: "Do you have any other symptoms?" (e.g., fever)     No 7. PREGNANCY: "Is there any chance you are pregnant?" "When was your last menstrual period?"     Not asked due to age.  Answer Assessment - Initial Assessment Questions 1. APPEARANCE of RASH: "Describe the rash."      It's the blisters.  It started with itching last night. 2. LOCATION: "Where is the rash located?"      My left thigh and knee.  It's in the same place it was last time. 3. NUMBER: "How many spots are there?"      Did not ask. 4. SIZE: "How big are the spots?" (Inches, centimeters or compare to size of a coin)      Little blisters.  They look like the prior herpes outbreak I had. 5. ONSET: "When did the rash start?"      Last night. 6. ITCHING: "Does the rash itch?" If so, ask: "How bad is the itch?"  (Scale 1-10; or mild, moderate, severe)     Yes.   I'm not scratching them.   I scratched it last time I had an outbreak and made it worse so I'm not scratching but it does itch.   7. PAIN: "Does the rash hurt?" If so, ask: "How bad is the pain?"  (Scale 1-10; or mild, moderate, severe)     Denies pain 8. OTHER SYMPTOMS: "Do you have any other symptoms?" (e.g., fever)     Denies any other symptoms. 9. PREGNANCY: "Is there any chance you are pregnant?" "When was your last menstrual period?"     Not asked due to age.  Protocols used: RASH OR REDNESS - LOCALIZED-A-AH, SHINGLES-A-AH

## 2018-10-13 NOTE — Telephone Encounter (Signed)
I do not mind seeing her if she wants to confirm diagnosis or f/u.  She can go ahead and start acyclovir.  I can see her Thursday 3:30 for work in for this.  (I am unable to see her tomorrow due to meeting during lunch and not here tomorrow afternoon)

## 2018-10-13 NOTE — Telephone Encounter (Signed)
LMTCB

## 2018-10-14 ENCOUNTER — Telehealth: Payer: Self-pay | Admitting: *Deleted

## 2018-10-14 NOTE — Telephone Encounter (Signed)
See other phone note

## 2018-10-14 NOTE — Telephone Encounter (Signed)
Copied from CRM [id number]. Topic: General - Other >> Oct 13, 2018  5:40 PM Lorrine Kin, [place] wrote: Reason for CRM: Patient returning a call back to [place]. Please advise.

## 2018-10-14 NOTE — Telephone Encounter (Signed)
Patient is aware that we could work her in and stated that she did not feel that she needed to be seen at this time. She is going to start acyclovir and let me know if sx do not resolve. I advised her to call and let us know if she has any problems.

## 2018-10-16 ENCOUNTER — Other Ambulatory Visit: Payer: Self-pay | Admitting: Internal Medicine

## 2018-11-03 DIAGNOSIS — M0579 Rheumatoid arthritis with rheumatoid factor of multiple sites without organ or systems involvement: Secondary | ICD-10-CM | POA: Diagnosis not present

## 2018-11-09 ENCOUNTER — Ambulatory Visit: Payer: Medicare HMO

## 2018-11-16 ENCOUNTER — Ambulatory Visit
Admission: RE | Admit: 2018-11-16 | Discharge: 2018-11-16 | Disposition: A | Discharge status: Home / Self Care | Payer: Medicare HMO | Source: Ambulatory Visit | Attending: Internal Medicine | Admitting: Internal Medicine

## 2018-11-16 DIAGNOSIS — I700 Atherosclerosis of aorta: Secondary | ICD-10-CM | POA: Diagnosis not present

## 2018-11-16 DIAGNOSIS — I2510 Atherosclerotic heart disease of native coronary artery without angina pectoris: Secondary | ICD-10-CM | POA: Diagnosis not present

## 2018-11-16 DIAGNOSIS — R918 Other nonspecific abnormal finding of lung field: Secondary | ICD-10-CM | POA: Diagnosis not present

## 2018-11-16 DIAGNOSIS — R911 Solitary pulmonary nodule: Secondary | ICD-10-CM | POA: Diagnosis not present

## 2018-11-17 ENCOUNTER — Ambulatory Visit (INDEPENDENT_AMBULATORY_CARE_PROVIDER_SITE_OTHER): Payer: Medicare HMO

## 2018-11-17 VITALS — BP 110/68 | HR 78 | Temp 98.4°F | Resp 14 | Ht 59.5 in | Wt 105.1 lb

## 2018-11-17 DIAGNOSIS — Z0000 Encounter for general adult medical examination without abnormal findings: Secondary | ICD-10-CM | POA: Diagnosis not present

## 2018-11-17 NOTE — Patient Instructions (Addendum)
  Ms. Blocher , Thank you for taking time to come for your Medicare Wellness Visit. I appreciate your ongoing commitment to your health goals. Please review the following plan we discussed and let me know if I can assist you in the future.   Follow up as needed.    Bring a copy of your Health Care Power of Attorney and/or Living Will to be scanned into chart.  Keep all routine maintenance appointments.   Have a great day!  These are the goals we discussed: Goals    . Maintain Healthy Lifestyle     30-40 ounces of fluid Healthy diet Stay active       This is a list of the screening recommended for you and due dates:  Health Maintenance  Topic Date Due  .  Hepatitis C: One time screening is recommended by Center for Disease Control  (CDC) for  adults born from [birth year] through 1967.   Jan 25, 1946  . Flu Shot  07/30/2018  . Tetanus Vaccine  05/07/2020*  . Pneumonia vaccines (2 of 2 - PPSV23) 11/25/2018  . Mammogram  06/02/2019  . Colon Cancer Screening  09/24/2019  . DEXA scan (bone density measurement)  Completed  *Topic was postponed. The date shown is not the original due date.

## 2018-11-17 NOTE — Progress Notes (Signed)
Subjective:   Erica Little is a 72 y.o. female who presents for Medicare Annual (Subsequent) preventive examination.  Review of Systems:  No ROS.  Medicare Wellness Visit. Additional risk factors are reflected in the social history. Cardiac Risk Factors include: advanced age (>55men, >56 women);hypertension     Objective:     Vitals: BP 110/68 (BP Location: Left Arm, Patient Position: Sitting, Cuff Size: Normal)   Pulse 78   Temp 98.4 F (36.9 C) (Oral)   Resp 14   Ht 4' 11.5" (1.511 m)   Wt 105 lb 1.9 oz (47.7 kg)   SpO2 99%   BMI 20.88 kg/m   Body mass index is 20.88 kg/m.  Advanced Directives 11/17/2018 11/06/2017 08/16/2016 11/18/2015  Does Patient Have a Medical Advance Directive? Yes Yes Yes Yes  Type of Estate agent of [place];Living will Living will;Healthcare Power of Attorney Living will -  Does patient want to make changes to medical advance directive? No - Patient declined No - Patient declined - -  Copy of Healthcare Power of Attorney in Chart? No - copy requested No - copy requested - -    Tobacco Social History   Tobacco Use  Smoking Status Former Smoker  . Last attempt to quit: 12/30/1988  . Years since quitting: 29.9  Smokeless Tobacco Never Used     Counseling given: Not Answered   Clinical Intake:  Pre-visit preparation completed: Yes  Pain : No/denies pain     Nutritional Status: BMI of 19-24  Normal Diabetes: No  How often do you need to have someone help you when you read instructions, pamphlets, or other written materials from your doctor or pharmacy?: 1 - Never  Interpreter Needed?: No     Past Medical History:  Diagnosis Date  . Family history of brain aneurysm   . GERD (gastroesophageal reflux disease)   . Hyperlipidemia   . Hypertension   . MRSA (methicillin resistant Staphylococcus aureus)    skin infections  . Rheumatoid arthritis(714.0)    transaminitis on MTX, remicade rxn, s/p sulfasalazine,  orencia  . Ulcer disease    Past Surgical History:  Procedure Laterality Date  . CHOLECYSTECTOMY  2010  . INTRACRANIAL ANEURYSM REPAIR  05/29/2016  . MCP replacements  2001   left  . TUBAL LIGATION     Family History  Problem Relation Age of Onset  . Heart disease Mother   . Diabetes Mother   . Hypercholesterolemia Mother   . Heart disease Father   . Heart disease Brother        MI age 46  . Hypercholesterolemia Sister   . Cancer Sister   . Colon polyps Sister   . Arthritis/Rheumatoid Paternal Aunt   . Arthritis/Rheumatoid Paternal Uncle   . Breast cancer Neg Hx    Social History   Socioeconomic History  . Marital status: Widowed    Spouse name: Not on file  . Number of children: 1  . Years of education: Not on file  . Highest education level: Not on file  Occupational History  . Not on file  Social Needs  . Financial resource strain: Not hard at all  . Food insecurity:    Worry: Never true    Inability: Never true  . Transportation needs:    Medical: No    Non-medical: No  Tobacco Use  . Smoking status: Former Smoker    Last attempt to quit: 12/30/1988    Years since quitting: 29.9  .  Smokeless tobacco: Never Used  Substance and Sexual Activity  . Alcohol use: No    Alcohol/week: 0.0 standard drinks  . Drug use: No  . Sexual activity: Yes  Lifestyle  . Physical activity:    Days per week: Not on file    Minutes per session: Not on file  . Stress: Not at all  Relationships  . Social connections:    Talks on phone: Not on file    Gets together: Not on file    Attends religious service: Not on file    Active member of club or organization: Not on file    Attends meetings of clubs or organizations: Not on file    Relationship status: Widowed  Other Topics Concern  . Not on file  Social History Narrative   Enjoys dancing     Outpatient Encounter Medications as of 11/17/2018  Medication Sig  . abatacept (ORENCIA) 250 MG injection Once every month    . albuterol (PROVENTIL HFA;VENTOLIN HFA) 108 (90 BASE) MCG/ACT inhaler Inhale 2 puffs into the lungs every 6 (six) hours as needed for wheezing or shortness of breath.  Marland Kitchen amLODipine (NORVASC) 2.5 MG tablet TAKE 1 TABLET BY MOUTH EVERY DAY  . aspirin 81 MG chewable tablet Chew 81 mg by mouth daily.  . calcium gluconate 650 MG tablet Take 650 mg by mouth daily.  . cholecalciferol (VITAMIN D) 1000 UNITS tablet Take 1,000 Units by mouth daily.  . dabigatran (PRADAXA) 75 MG CAPS capsule Take 75 mg by mouth 2 (two) times daily.  Marland Kitchen ELDERBERRY PO Take by mouth daily.  . fluticasone (FLONASE) 50 MCG/ACT nasal spray Place 1-2 sprays into both nostrils daily as needed.  Marland Kitchen losartan (COZAAR) 100 MG tablet TAKE 1 TABLET BY MOUTH EVERY DAY  . lovastatin (MEVACOR) 10 MG tablet TAKE 1 TABLET BY MOUTH EVERYDAY AT BEDTIME  . magnesium oxide (MAG-OX) 400 MG tablet Take 400 mg by mouth daily.  . methotrexate (RHEUMATREX) 2.5 MG tablet Take 2.5 mg by mouth once a week.   . methotrexate 50 MG/2ML injection INJECT 0.2MLS SUBCUTANEOUSLY WEEKLY DISPENSE 2 VIALS  . pantoprazole (PROTONIX) 40 MG tablet TAKE 1 TABLET BY MOUTH EVERY DAY  . valACYclovir (VALTREX) 1000 MG tablet Take 1 tablet (1,000 mg total) by mouth 2 (two) times daily.   No facility-administered encounter medications on file as of 11/17/2018.     Activities of Daily Living In your present state of health, do you have any difficulty performing the following activities: 11/17/2018  Hearing? N  Vision? N  Difficulty concentrating or making decisions? N  Walking or climbing stairs? N  Dressing or bathing? N  Doing errands, shopping? N  Preparing Food and eating ? N  Using the Toilet? N  In the past six months, have you accidently leaked urine? N  Do you have problems with loss of bowel control? N  Managing your Medications? N  Managing your Finances? N  Housekeeping or managing your Housekeeping? N  Some recent data might be hidden    Patient  Care Team: Dale Dodson, MD as PCP - General (Internal Medicine)    Assessment:   This is a routine wellness examination for Erica Little.  The goal of the wellness visit is to assist the patient how to close the gaps in care and create a preventative care plan for the patient.   The roster of all physicians providing medical care to patient is listed in the Snapshot section of the chart.  Taking calcium  VIT D as appropriate/Osteoporosis risk reviewed.    Safety issues reviewed; Smoke and carbon monoxide detectors in the home. No firearms in the home. Wears seatbelts when driving or riding with others. No violence in the home.  They do not have excessive sun exposure.  Discussed the need for sun protection: hats, long sleeves and the use of sunscreen if there is significant sun exposure.  Patient is alert, normal appearance, oriented to person/place/and time.  Correctly identified the president of the [place] and recalls of 3/3 words. Performs simple calculations and can read correct time from watch face.  Displays appropriate judgement.  No new identified risk were noted.  No failures at ADL's or IADL's.    BMI- discussed the importance of a healthy diet, water intake and the benefits of aerobic exercise.   24 hour diet recall: Appetite is the same.  She plans to add more protein to her diet. Fluid restriction 30-40 ounces.   Dental- every 3 months.  Sleep patterns- Sleeps ok at night.    Influenza vaccine declined.   Hep c screening discussed.   Patient Concerns: None at this time. Follow up with PCP as needed.  Exercise Activities and Dietary recommendations Current Exercise Habits: Home exercise routine, Type of exercise: calisthenics;stretching(dancing), Time (Minutes): 15, Frequency (Times/Week): 7, Weekly Exercise (Minutes/Week): 105, Intensity: Mild  Goals    . Maintain Healthy Lifestyle     30-40 ounces of fluid Healthy diet Stay active       Fall Risk Fall  Risk  11/17/2018 11/06/2017 12/05/2016 05/08/2015 04/22/2014  Falls in the past year? 0 No No No No   Depression Screen PHQ 2/9 Scores 11/17/2018 11/06/2017 12/05/2016 05/08/2015  PHQ - 2 Score 0 0 0 0  PHQ- 9 Score - - - -     Cognitive Function MMSE - Mini Mental State Exam 11/06/2017  Orientation to time 5  Orientation to Place 5  Registration 3  Attention/ Calculation 5  Recall 3  Language- name 2 objects 2  Language- repeat 1  Language- follow 3 step command 3  Language- read & follow direction 1  Write a sentence 1  Copy design 1  Total score 30     6CIT Screen 11/17/2018  What Year? 0 points  What month? 0 points  What time? 0 points  Count back from 20 0 points  Months in reverse 0 points  Repeat phrase 0 points  Total Score 0    Immunization History  Administered Date(s) Administered  . Pneumococcal Conjugate-13 11/25/2017   Screening Tests Health Maintenance  Topic Date Due  . Hepatitis C Screening  [birth year]  . INFLUENZA VACCINE  07/30/2018  . TETANUS/TDAP  05/07/2020 (Originally 12/21/1965)  . PNA vac Low Risk Adult (2 of 2 - PPSV23) 11/25/2018  . MAMMOGRAM  06/02/2019  . COLONOSCOPY  09/24/2019  . DEXA SCAN  Completed      Plan:    End of life planning; Advance aging; Advanced directives discussed. Copy of current HCPOA/Living Will requested.    I have personally reviewed and noted the following in the patient's chart:   . Medical and social history . Use of alcohol, tobacco or illicit drugs  . Current medications and supplements . Functional ability and status . Nutritional status . Physical activity . Advanced directives . List of other physicians . Hospitalizations, surgeries, and ER visits in previous 12 months . Vitals . Screenings to include cognitive, depression, and falls . Referrals and appointments  In addition, I  have reviewed and discussed with patient certain preventive protocols, quality metrics, and best practice  recommendations. A written personalized care plan for preventive services as well as general preventive health recommendations were provided to patient.     Ashok Pall, LPN  24/08/7352   Reviewed above information.  Agree with assessment and plan.    Dr Lorin Picket

## 2018-11-18 ENCOUNTER — Ambulatory Visit: Payer: Medicare HMO | Admitting: Internal Medicine

## 2018-11-18 ENCOUNTER — Encounter: Payer: Self-pay | Admitting: Internal Medicine

## 2018-11-18 VITALS — BP 100/60 | HR 65 | Ht 59.0 in | Wt 105.2 lb

## 2018-11-18 DIAGNOSIS — R05 Cough: Secondary | ICD-10-CM

## 2018-11-18 DIAGNOSIS — R059 Cough, unspecified: Secondary | ICD-10-CM

## 2018-11-18 DIAGNOSIS — R918 Other nonspecific abnormal finding of lung field: Secondary | ICD-10-CM | POA: Diagnosis not present

## 2018-11-18 NOTE — Patient Instructions (Signed)
Will check another CT chest in 1 year and see you after that.

## 2018-11-18 NOTE — Progress Notes (Signed)
[facility] Pulmonary Medicine Consultation      Assessment and Plan:  Chronic cough. - Patient has a chronic cough that is been present for about 5 years. - Patient has a history of vocal cord nodules, it is felt that the cough is likely related to these, she has been evaluated by ENT. - Treat the cough as needed with cough suppressants.  Lung nodules. -Most are 4 mm or smaller, there is one groundglass nodule up to 8 mm in diameter.  These appear to be low risk for cancer, particularly given her history of rheumatoid arthritis.  1 of the nodules has a groundglass component -Repeat CT chest without contrast in 1 year, follow-up after that.  Rheumatoid arthritis with interstitial lung disease. -Chronic interstitial changes appear stable.    COPD/emphysema. -Presence of COPD confirmed on pulmonary function testing, however this appears to be very mild with an FEV1 of 100%.  She is not symptomatic, therefore no treatment is necessary, she is not a current smoker.  --Prevnar 13; 11/24/17.  Declines flu vaccine today.  Orders Placed This Encounter  Procedures  . CT CHEST WO CONTRAST   Return in about 1 year (around 11/19/2019).   Date: 11/18/2018  MRN# [id number] Erica Little [birth year]    Erica Little is a 72 y.o. old female seen in consultation for chief complaint of:    Chief Complaint  Patient presents with  . Cough    been better lately, clear  . Follow-up    CT scan yesterday   HPI:  The patient is a 72 year old female, she has a history of COPD rheumatoid arthritis lung nodules.  She was initially seen due to history of chronic cough which at last visit had resolved.  She was asked to repeat her CAT scan in follow-up.  Review of the CAT scan shows stability of nodules, with the largest being 7 mm groundglass nodule, with continued follow-up recommended.  Since her last visit she feels that she is doing well, she feels that the cough is still bothersome though  better than it has in the past. It is felt to be related to vocal cord nodules which have been evaluated by ENT.    **CT chest 11/18/2018>> imaging personally reviewed, unchanged pulmonary nodules.  Per radiology report largest nodule is 7 mm appears to be a groundglass nodule. **Imaging personally reviewed, CT chest high-resolution 11/18/17, noncontrasted CT chest 05/19/2018; no evidence of interstitial lung disease, there are several tiny lung nodules, the largest of which is an 8 mm groundglass nodule, there are solid nodules which are 4 mm or less.  Review of the most recent scans shows no significant change from previous, largest nodule in the right middle lobe is 4 mm, largest groundglass nodule in the left lower lobe is 8 mm.  PFT 11/18/17. Spirometry FVC 126% of predicted, FEV1 equals 101%, ratio was 65%, there was no bronchodilator administered. Lung volumes show TLC of 135%, vital capacity 126%.  RV to TLC ratio is normal. DLCO is normal at 95%.  Flow volume loop suggests obstruction.  Overall this test is suggestive of mild COPD.    Social Hx:   Social History   Tobacco Use  . Smoking status: Former Smoker    Last attempt to quit: 12/30/1988    Years since quitting: 29.9  . Smokeless tobacco: Never Used  Substance Use Topics  . Alcohol use: No    Alcohol/week: 0.0 standard drinks  . Drug use: No   Medication:  Current Outpatient Medications:  .  abatacept (ORENCIA) 250 MG injection, Once every month, Disp: , Rfl:  .  albuterol (PROVENTIL HFA;VENTOLIN HFA) 108 (90 BASE) MCG/ACT inhaler, Inhale 2 puffs into the lungs every 6 (six) hours as needed for wheezing or shortness of breath., Disp: 1 Inhaler, Rfl: 2 .  amLODipine (NORVASC) 2.5 MG tablet, TAKE 1 TABLET BY MOUTH EVERY DAY, Disp: 90 tablet, Rfl: 1 .  aspirin 81 MG chewable tablet, Chew 81 mg by mouth daily., Disp: , Rfl:  .  calcium gluconate 650 MG tablet, Take 650 mg by mouth daily., Disp: , Rfl:  .  cholecalciferol  (VITAMIN D) 1000 UNITS tablet, Take 1,000 Units by mouth daily., Disp: , Rfl:  .  dabigatran (PRADAXA) 75 MG CAPS capsule, Take 75 mg by mouth 2 (two) times daily., Disp: , Rfl:  .  ELDERBERRY PO, Take by mouth daily., Disp: , Rfl:  .  fluticasone (FLONASE) 50 MCG/ACT nasal spray, Place 1-2 sprays into both nostrils daily as needed., Disp: 16 g, Rfl: 2 .  losartan (COZAAR) 100 MG tablet, TAKE 1 TABLET BY MOUTH EVERY DAY, Disp: 90 tablet, Rfl: 1 .  lovastatin (MEVACOR) 10 MG tablet, TAKE 1 TABLET BY MOUTH EVERYDAY AT BEDTIME, Disp: 90 tablet, Rfl: 1 .  magnesium oxide (MAG-OX) 400 MG tablet, Take 400 mg by mouth daily., Disp: , Rfl:  .  methotrexate (RHEUMATREX) 2.5 MG tablet, Take 2.5 mg by mouth once a week. , Disp: , Rfl:  .  methotrexate 50 MG/2ML injection, INJECT 0.2MLS SUBCUTANEOUSLY WEEKLY DISPENSE 2 VIALS, Disp: , Rfl: 1 .  pantoprazole (PROTONIX) 40 MG tablet, TAKE 1 TABLET BY MOUTH EVERY DAY, Disp: 90 tablet, Rfl: 0 .  valACYclovir (VALTREX) 1000 MG tablet, Take 1 tablet (1,000 mg total) by mouth 2 (two) times daily., Disp: 20 tablet, Rfl: 0   Allergies:  Oxycodone; Remicade [infliximab]; Simvastatin; Sulfa antibiotics; Zetia [ezetimibe]; Latex; and Tape    Review of Systems:  Constitutional: Feels well. Cardiovascular: Denies chest pain, exertional chest pain.  Pulmonary: Denies hemoptysis, pleuritic chest pain.   The remainder of systems were reviewed and were found to be negative other than what is documented in the HPI.    Physical Examination:   VS: BP 100/60 (BP Location: Left Arm, Cuff Size: Normal)   Pulse 65   Ht 4\' 11"  (1.499 m)   Wt 105 lb 3.2 oz (47.7 kg)   SpO2 98%   BMI 21.25 kg/m   General Appearance: No distress  Neuro:without focal findings, mental status, speech normal, alert and oriented HEENT: PERRLA, EOM intact Pulmonary: No wheezing, No rales  CardiovascularNormal S1,S2.  No m/r/g.  Abdomen: Benign, Soft, non-tender, No masses Renal:  No  costovertebral tenderness  GU:  No performed at this time. Endoc: No evident thyromegaly, no signs of acromegaly or Cushing features Skin:   warm, no rashes, no ecchymosis  Extremities: normal, no cyanosis, clubbing.    LABORATORY PANEL:   CBC No results for input(s): WBC, HGB, HCT, PLT in the last 168 hours. ------------------------------------------------------------------------------------------------------------------  Chemistries  No results for input(s): NA, K, CL, CO2, GLUCOSE, BUN, CREATININE, CALCIUM, MG, AST, ALT, ALKPHOS, BILITOT in the last 168 hours.  Invalid input(s): GFRCGP ------------------------------------------------------------------------------------------------------------------  Cardiac Enzymes No results for input(s): TROPONINI in the last 168 hours. ------------------------------------------------------------  RADIOLOGY:  Ct Chest Wo Contrast  Result Date: 11/17/2018 CLINICAL DATA:  72 year old female with a history of pulmonary nodules including a left lower lobe ground-glass pulmonary nodule. EXAM: CT CHEST  WITHOUT CONTRAST TECHNIQUE: Multidetector CT imaging of the chest was performed following the standard protocol without IV contrast. COMPARISON:  Prior CT scan of the chest 05/19/2018 FINDINGS: Cardiovascular: Limited evaluation in the absence of intravenous contrast. Calcifications are present throughout the coronary arteries. The heart is normal in size. No pericardial effusion. Calcifications also present along the thoracic aorta. No evidence of aneurysm. Mediastinum/Nodes: Unremarkable CT appearance of the thyroid gland. No suspicious mediastinal or hilar adenopathy. No soft tissue mediastinal mass. The thoracic esophagus is unremarkable. Lungs/Pleura: Continued stability of scattered pulmonary nodules, the majority of which are small (4 mm and less). The previously identified ground-glass attenuation nodular opacity in the periphery of the left lower  lobe is again visualized (image 81, series 3) and measures 0.7 x 0.7 cm, insignificantly changed compared to 0.8 x 0.6 cm previously. No significant increase in solid component. Mild biapical pleuroparenchymal scarring again noted. No significant emphysematous change, pneumothorax or pleural effusion. Upper Abdomen: Small circumscribed low-attenuation lesions in the liver are incompletely characterized in the absence of intravenous contrast but remains stable over multiple prior studies dating back to 2018 and are therefore highly likely benign. The gallbladder is surgically absent. No acute abnormality in the visualized upper abdomen. Musculoskeletal: No acute fracture or aggressive appearing lytic or blastic osseous lesion. IMPRESSION: 1. Continued stability of small bilateral pulmonary nodules. The smaller (less than 5 mm) pulmonary nodules are now confirm stable for 1 year and should be considered benign. The largest nodule remains a ground-glass attenuation nodule in the left lower lobe which is also unchanged at a maximum of 0.7 cm. Recommend continued surveillance with repeat CT is recommended every 2 years until 5 years of stability has been established. This recommendation follows the consensus statement: Guidelines for Management of Incidental Pulmonary Nodules Detected on CT Images:From the Fleischner Society 2017; published online before print (10.1148/radiol.1884166063). 2. Coronary artery calcifications. 3.  Aortic Atherosclerosis (ICD10-170.0) Electronically Signed   By: Malachy Moan M.D.   On: 11/17/2018 08:06       Thank  you for the consultation and for allowing [facility] to assist in the care of your patient. Our recommendations are noted above.  Please contact us if we can be of further service.  Wells Guiles, M.D., F.C.C.P.  Board Certified in Internal Medicine, Pulmonary Medicine, Critical Care Medicine, and Sleep Medicine.  [facility] Office Number: [phone]   11/18/2018

## 2018-11-30 DIAGNOSIS — Z79899 Other long term (current) drug therapy: Secondary | ICD-10-CM | POA: Diagnosis not present

## 2018-11-30 DIAGNOSIS — M0579 Rheumatoid arthritis with rheumatoid factor of multiple sites without organ or systems involvement: Secondary | ICD-10-CM | POA: Diagnosis not present

## 2018-12-06 ENCOUNTER — Other Ambulatory Visit: Payer: Self-pay | Admitting: Internal Medicine

## 2018-12-10 ENCOUNTER — Encounter: Payer: Self-pay | Admitting: Internal Medicine

## 2018-12-10 ENCOUNTER — Ambulatory Visit (INDEPENDENT_AMBULATORY_CARE_PROVIDER_SITE_OTHER): Payer: Medicare HMO | Admitting: Internal Medicine

## 2018-12-10 VITALS — BP 102/64 | HR 71 | Temp 97.9°F | Resp 16 | Wt 106.4 lb

## 2018-12-10 DIAGNOSIS — M069 Rheumatoid arthritis, unspecified: Secondary | ICD-10-CM

## 2018-12-10 DIAGNOSIS — I10 Essential (primary) hypertension: Secondary | ICD-10-CM | POA: Diagnosis not present

## 2018-12-10 DIAGNOSIS — Z23 Encounter for immunization: Secondary | ICD-10-CM

## 2018-12-10 DIAGNOSIS — E871 Hypo-osmolality and hyponatremia: Secondary | ICD-10-CM

## 2018-12-10 DIAGNOSIS — E7800 Pure hypercholesterolemia, unspecified: Secondary | ICD-10-CM

## 2018-12-10 DIAGNOSIS — K2970 Gastritis, unspecified, without bleeding: Secondary | ICD-10-CM | POA: Diagnosis not present

## 2018-12-10 DIAGNOSIS — J449 Chronic obstructive pulmonary disease, unspecified: Secondary | ICD-10-CM | POA: Diagnosis not present

## 2018-12-10 DIAGNOSIS — Z0000 Encounter for general adult medical examination without abnormal findings: Secondary | ICD-10-CM

## 2018-12-10 DIAGNOSIS — Z9889 Other specified postprocedural states: Secondary | ICD-10-CM | POA: Diagnosis not present

## 2018-12-10 DIAGNOSIS — Z8679 Personal history of other diseases of the circulatory system: Secondary | ICD-10-CM

## 2018-12-10 DIAGNOSIS — I700 Atherosclerosis of aorta: Secondary | ICD-10-CM

## 2018-12-10 DIAGNOSIS — F439 Reaction to severe stress, unspecified: Secondary | ICD-10-CM

## 2018-12-10 DIAGNOSIS — R739 Hyperglycemia, unspecified: Secondary | ICD-10-CM

## 2018-12-10 DIAGNOSIS — R918 Other nonspecific abnormal finding of lung field: Secondary | ICD-10-CM

## 2018-12-10 DIAGNOSIS — R0989 Other specified symptoms and signs involving the circulatory and respiratory systems: Secondary | ICD-10-CM

## 2018-12-10 NOTE — Assessment & Plan Note (Signed)
Physical today 12/10/18.  PAP 03/2014 - negative with negative HPV.  Mammogram 06/01/18 - Birads I.  Colonoscopy 08/2014.  Recommended f/u in 08/2019.

## 2018-12-10 NOTE — Progress Notes (Signed)
Patient ID: Erica Little, female   DOB: 04/12/1946, 72 y.o.   MRN: [id number]   Subjective:    Patient ID: Erica Little, female    DOB: [birth year], 72 y.o.   MRN: [id number]  HPI  Patient here for her physical exam.   She reports she is doing well.  Stays active.  Dancing.  No chest pain.  No sob.  No acid reflux.  No abdominal pain.  Bowels moving.  Eating. Weight stable.  Does not want to gain weight.  Watching her diet.  Saw pulmonary 11/18/18.  Recommended f/u CT chest in one year.  Has f/u with nephrology 01/2019.  Has f/u with Dr Jefm Bryant 12/2018.     Past Medical History:  Diagnosis Date  . Family history of brain aneurysm   . GERD (gastroesophageal reflux disease)   . Hyperlipidemia   . Hypertension   . MRSA (methicillin resistant Staphylococcus aureus)    skin infections  . Rheumatoid arthritis(714.0)    transaminitis on MTX, remicade rxn, s/p sulfasalazine, orencia  . Ulcer disease    Past Surgical History:  Procedure Laterality Date  . CHOLECYSTECTOMY  2010  . INTRACRANIAL ANEURYSM REPAIR  05/29/2016  . MCP replacements  2001   left  . TUBAL LIGATION     Family History  Problem Relation Age of Onset  . Heart disease Mother   . Diabetes Mother   . Hypercholesterolemia Mother   . Heart disease Father   . Heart disease Brother        MI age 60  . Hypercholesterolemia Sister   . Cancer Sister   . Colon polyps Sister   . Arthritis/Rheumatoid Paternal Aunt   . Arthritis/Rheumatoid Paternal Uncle   . Breast cancer Neg Hx    Social History   Socioeconomic History  . Marital status: Widowed    Spouse name: Not on file  . Number of children: 1  . Years of education: Not on file  . Highest education level: Not on file  Occupational History  . Not on file  Social Needs  . Financial resource strain: Not hard at all  . Food insecurity:    Worry: Never true    Inability: Never true  . Transportation needs:    Medical: No    Non-medical: No  Tobacco Use  .  Smoking status: Former Smoker    Last attempt to quit: 12/30/1988    Years since quitting: 29.9  . Smokeless tobacco: Never Used  Substance and Sexual Activity  . Alcohol use: No    Alcohol/week: 0.0 standard drinks  . Drug use: No  . Sexual activity: Yes  Lifestyle  . Physical activity:    Days per week: Not on file    Minutes per session: Not on file  . Stress: Not at all  Relationships  . Social connections:    Talks on phone: Not on file    Gets together: Not on file    Attends religious service: Not on file    Active member of club or organization: Not on file    Attends meetings of clubs or organizations: Not on file    Relationship status: Widowed  Other Topics Concern  . Not on file  Social History Narrative   Enjoys dancing     Outpatient Encounter Medications as of 12/10/2018  Medication Sig  . abatacept (ORENCIA) 250 MG injection Once every month  . albuterol (PROVENTIL HFA;VENTOLIN HFA) 108 (90 BASE) MCG/ACT inhaler Inhale 2 puffs into  the lungs every 6 (six) hours as needed for wheezing or shortness of breath.  Marland Kitchen amLODipine (NORVASC) 2.5 MG tablet TAKE 1 TABLET BY MOUTH EVERY DAY  . aspirin 81 MG chewable tablet Chew 81 mg by mouth daily.  . calcium gluconate 650 MG tablet Take 650 mg by mouth daily.  . cholecalciferol (VITAMIN D) 1000 UNITS tablet Take 1,000 Units by mouth daily.  . dabigatran (PRADAXA) 75 MG CAPS capsule Take 75 mg by mouth 2 (two) times daily.  Marland Kitchen ELDERBERRY PO Take by mouth daily.  . fluticasone (FLONASE) 50 MCG/ACT nasal spray Place 1-2 sprays into both nostrils daily as needed.  Marland Kitchen losartan (COZAAR) 100 MG tablet TAKE 1 TABLET BY MOUTH EVERY DAY  . lovastatin (MEVACOR) 10 MG tablet TAKE 1 TABLET BY MOUTH EVERYDAY AT BEDTIME  . magnesium oxide (MAG-OX) 400 MG tablet Take 400 mg by mouth daily.  . methotrexate (RHEUMATREX) 2.5 MG tablet Take 2.5 mg by mouth once a week.   . methotrexate 50 MG/2ML injection INJECT 0.2MLS SUBCUTANEOUSLY WEEKLY  DISPENSE 2 VIALS  . pantoprazole (PROTONIX) 40 MG tablet TAKE 1 TABLET BY MOUTH EVERY DAY  . valACYclovir (VALTREX) 1000 MG tablet Take 1 tablet (1,000 mg total) by mouth 2 (two) times daily.   No facility-administered encounter medications on file as of 12/10/2018.     Review of Systems  Constitutional: Negative for appetite change and unexpected weight change.  HENT: Negative for congestion and sinus pressure.   Eyes: Negative for pain and visual disturbance.  Respiratory: Negative for cough, chest tightness and shortness of breath.   Cardiovascular: Negative for chest pain, palpitations and leg swelling.  Gastrointestinal: Negative for abdominal pain, diarrhea, nausea and vomiting.  Genitourinary: Negative for difficulty urinating and dysuria.  Musculoskeletal: Negative for joint swelling and myalgias.  Skin: Negative for color change and rash.  Neurological: Negative for dizziness, light-headedness and headaches.  Hematological: Negative for adenopathy. Does not bruise/bleed easily.  Psychiatric/Behavioral: Negative for agitation and dysphoric mood.       Objective:    Physical Exam Constitutional:      General: She is not in acute distress.    Appearance: Normal appearance. She is well-developed.  HENT:     Nose: Nose normal. No congestion.     Mouth/Throat:     Pharynx: No oropharyngeal exudate or posterior oropharyngeal erythema.  Eyes:     General: No scleral icterus.       Right eye: No discharge.        Left eye: No discharge.  Neck:     Musculoskeletal: Neck supple. No muscular tenderness.     Thyroid: No thyromegaly.  Cardiovascular:     Rate and Rhythm: Normal rate and regular rhythm.  Pulmonary:     Effort: No tachypnea, accessory muscle usage or respiratory distress.     Breath sounds: Normal breath sounds. No decreased breath sounds, wheezing or rhonchi.  Chest:     Breasts:        Right: No inverted nipple, mass, nipple discharge or tenderness (no  axillary adenopathy).        Left: No inverted nipple, mass, nipple discharge or tenderness (no axilarry adenopathy).  Abdominal:     General: Bowel sounds are normal.     Palpations: Abdomen is soft.     Tenderness: There is no abdominal tenderness.  Musculoskeletal:        General: No swelling or tenderness.  Lymphadenopathy:     Cervical: No cervical adenopathy.  Skin:  Findings: No erythema or rash.  Neurological:     Mental Status: She is alert and oriented to person, place, and time.  Psychiatric:        Mood and Affect: Mood normal.        Behavior: Behavior normal.     BP 102/64 (BP Location: Left Arm, Patient Position: Sitting, Cuff Size: Normal)   Pulse 71   Temp 97.9 F (36.6 C) (Oral)   Resp 16   Wt 106 lb 6.4 oz (48.3 kg)   SpO2 96%   BMI 21.49 kg/m  Wt Readings from Last 3 Encounters:  12/10/18 106 lb 6.4 oz (48.3 kg)  11/18/18 105 lb 3.2 oz (47.7 kg)  11/17/18 105 lb 1.9 oz (47.7 kg)     Lab Results  Component Value Date   WBC 5.8 08/27/2018   HGB 13.1 08/27/2018   HCT 39.5 08/27/2018   PLT 206.0 08/27/2018   GLUCOSE 88 08/27/2018   CHOL 170 08/27/2018   TRIG 118.0 08/27/2018   HDL 47.50 08/27/2018   LDLDIRECT 167.8 12/20/2013   LDLCALC 98 08/27/2018   ALT 11 08/27/2018   AST 19 08/27/2018   NA 130 (L) 08/27/2018   K 4.6 08/27/2018   CL 98 08/27/2018   CREATININE 0.96 08/27/2018   BUN 13 08/27/2018   CO2 25 08/27/2018   TSH 2.61 10/13/2017   INR 1.1 (H) 12/06/2013   HGBA1C 6.0 08/27/2018    Ct Chest Wo Contrast  Result Date: 11/17/2018 CLINICAL DATA:  72 year old female with a history of pulmonary nodules including a left lower lobe ground-glass pulmonary nodule. EXAM: CT CHEST WITHOUT CONTRAST TECHNIQUE: Multidetector CT imaging of the chest was performed following the standard protocol without IV contrast. COMPARISON:  Prior CT scan of the chest 05/19/2018 FINDINGS: Cardiovascular: Limited evaluation in the absence of intravenous  contrast. Calcifications are present throughout the coronary arteries. The heart is normal in size. No pericardial effusion. Calcifications also present along the thoracic aorta. No evidence of aneurysm. Mediastinum/Nodes: Unremarkable CT appearance of the thyroid gland. No suspicious mediastinal or hilar adenopathy. No soft tissue mediastinal mass. The thoracic esophagus is unremarkable. Lungs/Pleura: Continued stability of scattered pulmonary nodules, the majority of which are small (4 mm and less). The previously identified ground-glass attenuation nodular opacity in the periphery of the left lower lobe is again visualized (image 81, series 3) and measures 0.7 x 0.7 cm, insignificantly changed compared to 0.8 x 0.6 cm previously. No significant increase in solid component. Mild biapical pleuroparenchymal scarring again noted. No significant emphysematous change, pneumothorax or pleural effusion. Upper Abdomen: Small circumscribed low-attenuation lesions in the liver are incompletely characterized in the absence of intravenous contrast but remains stable over multiple prior studies dating back to 2018 and are therefore highly likely benign. The gallbladder is surgically absent. No acute abnormality in the visualized upper abdomen. Musculoskeletal: No acute fracture or aggressive appearing lytic or blastic osseous lesion. IMPRESSION: 1. Continued stability of small bilateral pulmonary nodules. The smaller (less than 5 mm) pulmonary nodules are now confirm stable for 1 year and should be considered benign. The largest nodule remains a ground-glass attenuation nodule in the left lower lobe which is also unchanged at a maximum of 0.7 cm. Recommend continued surveillance with repeat CT is recommended every 2 years until 5 years of stability has been established. This recommendation follows the consensus statement: Guidelines for Management of Incidental Pulmonary Nodules Detected on CT Images:From the Fleischner  Society 2017; published online before print (10.1148/radiol.4782956213). 2.  Coronary artery calcifications. 3.  Aortic Atherosclerosis (ICD10-170.0) Electronically Signed   By: Jacqulynn Cadet M.D.   On: 11/17/2018 08:06       Assessment & Plan:   Problem List Items Addressed This Visit    Aortic atherosclerosis ([place])    On lovastatin.        Carotid bruit    Followed by [facility].        COPD with chronic bronchitis and emphysema ([place])    Followed by pulmonary.  Breathing stable.        Gastritis    Previous EGD revealed gastritis.  On protonix.  Stable.        Health care maintenance    Physical today 12/10/18.  PAP 03/2014 - negative with negative HPV.  Mammogram 06/01/18 - Birads I.  Colonoscopy 08/2014.  Recommended f/u in 08/2019.        History of cerebral aneurysm     S/p stenting.  Being followed by [facility]. On plavix.        Hypercholesteremia    On lovastatin.  Low cholesterol diet and exercise.  Follow lipid panel.       Relevant Orders   Hepatic function panel   Lipid panel   Hyperglycemia    Low carb diet and exercise.  Follow met b and a1c.        Relevant Orders   Hemoglobin A1c   Hypertension    Blood pressure under good control.  Continue same medication regimen.  Follow pressures.  Follow metabolic panel.        Relevant Orders   TSH   Basic metabolic panel   Hyponatremia    Seeing nephrology.        Lung nodules    Saw pulmonary 10/2018.  Recommended f/u chest CT in one year.        Rheumatoid arthritis ([place])    On MTX and orencia.  Followed by Dr Jefm Bryant.        Stress    Discussed with her today.  Overall handling things relatively well.  Follow.         Other Visit Diagnoses    Routine general medical examination at a health care facility    -  Primary   Need for 23-polyvalent pneumococcal polysaccharide vaccine       Relevant Orders   Pneumococcal polysaccharide vaccine 23-valent greater than or equal to 2yo  subcutaneous/IM (Completed)       Einar Pheasant, MD

## 2018-12-19 ENCOUNTER — Encounter: Payer: Self-pay | Admitting: Internal Medicine

## 2018-12-19 NOTE — Assessment & Plan Note (Signed)
Followed by [facility].   

## 2018-12-19 NOTE — Assessment & Plan Note (Signed)
On lovastatin 

## 2018-12-19 NOTE — Assessment & Plan Note (Signed)
On lovastatin.  Low cholesterol diet and exercise.  Follow lipid panel.  

## 2018-12-19 NOTE — Assessment & Plan Note (Signed)
Saw pulmonary 10/2018.  Recommended f/u chest CT in one year.

## 2018-12-19 NOTE — Assessment & Plan Note (Signed)
Blood pressure under good control.  Continue same medication regimen.  Follow pressures.  Follow metabolic panel.   

## 2018-12-19 NOTE — Assessment & Plan Note (Signed)
S/p stenting.  Being followed by [facility]. On plavix.

## 2018-12-19 NOTE — Assessment & Plan Note (Signed)
Discussed with her today.  Overall handling things relatively well.  Follow.   

## 2018-12-19 NOTE — Assessment & Plan Note (Signed)
Seeing nephrology.   

## 2018-12-19 NOTE — Assessment & Plan Note (Signed)
Low carb diet and exercise.  Follow met b and a1c.   

## 2018-12-19 NOTE — Assessment & Plan Note (Signed)
Followed by pulmonary.  Breathing stable.   

## 2018-12-19 NOTE — Assessment & Plan Note (Signed)
On MTX and orencia.  Followed by Dr Kernodle.  

## 2018-12-19 NOTE — Assessment & Plan Note (Signed)
Previous EGD revealed gastritis.  On protonix.  Stable.

## 2019-01-04 DIAGNOSIS — M0579 Rheumatoid arthritis with rheumatoid factor of multiple sites without organ or systems involvement: Secondary | ICD-10-CM | POA: Diagnosis not present

## 2019-01-05 ENCOUNTER — Other Ambulatory Visit (INDEPENDENT_AMBULATORY_CARE_PROVIDER_SITE_OTHER): Payer: Medicare HMO

## 2019-01-05 DIAGNOSIS — R739 Hyperglycemia, unspecified: Secondary | ICD-10-CM | POA: Diagnosis not present

## 2019-01-05 DIAGNOSIS — E7800 Pure hypercholesterolemia, unspecified: Secondary | ICD-10-CM

## 2019-01-05 DIAGNOSIS — I10 Essential (primary) hypertension: Secondary | ICD-10-CM | POA: Diagnosis not present

## 2019-01-05 LAB — LIPID PANEL
CHOLESTEROL: 186 mg/dL (ref 0–200)
HDL: 48.8 mg/dL (ref >39.00)
LDL CALC: 110 mg/dL — AB (ref 0–99)
NonHDL: 137.38
Total CHOL/HDL Ratio: 4
Triglycerides: 135 mg/dL (ref 0.0–149.0)
VLDL: 27 mg/dL (ref 0.0–40.0)

## 2019-01-05 LAB — BASIC METABOLIC PANEL
BUN: 11 mg/dL (ref 6–23)
CHLORIDE: 101 meq/L (ref 96–112)
CO2: 27 mEq/L (ref 19–32)
Calcium: 9.6 mg/dL (ref 8.4–10.5)
Creatinine, Ser: 0.89 mg/dL (ref 0.40–1.20)
GFR: 65.89 mL/min (ref >60.00)
Glucose, Bld: 82 mg/dL (ref 70–99)
Potassium: 4.5 mEq/L (ref 3.5–5.1)
Sodium: 134 mEq/L — ABNORMAL LOW (ref 135–145)

## 2019-01-05 LAB — HEMOGLOBIN A1C: Hgb A1c MFr Bld: 5.7 % (ref 4.6–6.5)

## 2019-01-05 LAB — HEPATIC FUNCTION PANEL
ALT: 11 U/L (ref 0–35)
AST: 17 U/L (ref 0–37)
Albumin: 4.3 g/dL (ref 3.5–5.2)
Alkaline Phosphatase: 74 U/L (ref 39–117)
Bilirubin, Direct: 0.1 mg/dL (ref 0.0–0.3)
Total Bilirubin: 0.7 mg/dL (ref 0.2–1.2)
Total Protein: 6.6 g/dL (ref 6.0–8.3)

## 2019-01-05 LAB — TSH: TSH: 3.19 u[IU]/mL (ref 0.35–4.50)

## 2019-01-10 ENCOUNTER — Other Ambulatory Visit: Payer: Self-pay | Admitting: Internal Medicine

## 2019-01-22 ENCOUNTER — Other Ambulatory Visit: Payer: Self-pay | Admitting: Internal Medicine

## 2019-02-01 DIAGNOSIS — M0579 Rheumatoid arthritis with rheumatoid factor of multiple sites without organ or systems involvement: Secondary | ICD-10-CM | POA: Diagnosis not present

## 2019-02-03 DIAGNOSIS — E871 Hypo-osmolality and hyponatremia: Secondary | ICD-10-CM | POA: Diagnosis not present

## 2019-03-01 DIAGNOSIS — M0579 Rheumatoid arthritis with rheumatoid factor of multiple sites without organ or systems involvement: Secondary | ICD-10-CM | POA: Diagnosis not present

## 2019-03-01 DIAGNOSIS — Z79899 Other long term (current) drug therapy: Secondary | ICD-10-CM | POA: Diagnosis not present

## 2019-03-04 ENCOUNTER — Other Ambulatory Visit: Payer: Self-pay | Admitting: Internal Medicine

## 2019-03-05 ENCOUNTER — Other Ambulatory Visit: Payer: Self-pay

## 2019-03-05 MED ORDER — TELMISARTAN 40 MG PO TABS: 40.0000 mg | ORAL_TABLET | Freq: Every day | ORAL | 1 refills | Status: DC

## 2019-03-05 NOTE — Telephone Encounter (Signed)
Pt ok to start micardis. I have sent in script for micardis 40 mg q day

## 2019-03-05 NOTE — Telephone Encounter (Signed)
Need to contact pt and let her know that losartan is on back order.  If ok to change, I am ok to change to micardis.  Given she is on 100mg  of losartan, then I would recommend changing to micardis 40mg  q day.  She be more equivalent to what she is taking now.

## 2019-03-05 NOTE — Telephone Encounter (Signed)
Losartan is on back order are you ok with switching to telmisartan

## 2019-03-29 DIAGNOSIS — M0579 Rheumatoid arthritis with rheumatoid factor of multiple sites without organ or systems involvement: Secondary | ICD-10-CM | POA: Diagnosis not present

## 2019-04-05 ENCOUNTER — Other Ambulatory Visit: Payer: Self-pay | Admitting: Internal Medicine

## 2019-04-16 ENCOUNTER — Other Ambulatory Visit: Payer: Self-pay

## 2019-04-16 ENCOUNTER — Ambulatory Visit (INDEPENDENT_AMBULATORY_CARE_PROVIDER_SITE_OTHER): Payer: Medicare HMO | Admitting: Internal Medicine

## 2019-04-16 ENCOUNTER — Encounter: Payer: Self-pay | Admitting: Internal Medicine

## 2019-04-16 DIAGNOSIS — I10 Essential (primary) hypertension: Secondary | ICD-10-CM

## 2019-04-16 DIAGNOSIS — Z8679 Personal history of other diseases of the circulatory system: Secondary | ICD-10-CM

## 2019-04-16 DIAGNOSIS — R053 Chronic cough: Secondary | ICD-10-CM

## 2019-04-16 DIAGNOSIS — E7800 Pure hypercholesterolemia, unspecified: Secondary | ICD-10-CM

## 2019-04-16 DIAGNOSIS — R05 Cough: Secondary | ICD-10-CM

## 2019-04-16 DIAGNOSIS — K2970 Gastritis, unspecified, without bleeding: Secondary | ICD-10-CM | POA: Diagnosis not present

## 2019-04-16 DIAGNOSIS — R918 Other nonspecific abnormal finding of lung field: Secondary | ICD-10-CM

## 2019-04-16 DIAGNOSIS — J449 Chronic obstructive pulmonary disease, unspecified: Secondary | ICD-10-CM | POA: Diagnosis not present

## 2019-04-16 DIAGNOSIS — M069 Rheumatoid arthritis, unspecified: Secondary | ICD-10-CM

## 2019-04-16 DIAGNOSIS — Z9889 Other specified postprocedural states: Secondary | ICD-10-CM

## 2019-04-16 DIAGNOSIS — E871 Hypo-osmolality and hyponatremia: Secondary | ICD-10-CM

## 2019-04-16 DIAGNOSIS — I700 Atherosclerosis of aorta: Secondary | ICD-10-CM | POA: Diagnosis not present

## 2019-04-16 DIAGNOSIS — R739 Hyperglycemia, unspecified: Secondary | ICD-10-CM

## 2019-04-16 DIAGNOSIS — R634 Abnormal weight loss: Secondary | ICD-10-CM

## 2019-04-16 NOTE — Progress Notes (Addendum)
Patient ID: Erica Little, female   DOB: 05-14-1946, 73 y.o.   MRN: [id number] Virtual Visit via Telephone:  Note  This visit type was conducted due to national recommendations for restrictions regarding the COVID-19 pandemic (e.g. social distancing).  This format is felt to be most appropriate for this patient at this time.  All issues noted in this document were discussed and addressed.  No physical exam was performed (except for noted visual exam findings with Video Visits).   I connected with Erica Little on 04/16/19 at  2:00 PM EDT by telephone and verified that I am speaking with the correct person using two identifiers. Location patient: home Location provider: work Persons participating in the virtual visit: patient, provider  I discussed the limitations, risks, security and privacy concerns of performing an evaluation and management service by telephone and the availability of in person appointments. The patient expressed understanding and agreed to proceed.   Reason for visit: follow up appt  HPI: States she is doing well.  She is trying to stay in.  No known COVID exposure.  No fever.  No chest congestion or sob.  She is eating.  Has a good appetite.  Weight is 103.2 pounds.  Weighed today.  Weight last visit, 106 pounds.  She states weight is stable. States her scales usually weight her a couple of pounds less than our scales.  She has previously had problems with gastritis.  Off protonix and doing well off medication.  Taking lovastatin and tolerating.  Watches her diet closely.  Cough is better.  Cough felt to be related to vocal cord nodules, etc.  Lung nodules noted on CT.  Evaluated 10/2018 by pulmonary.  Recommended f/u CT chest in one year.  Saw nephrology 01/2019.  Recommended f/u in one year. Joints stable.  Sees Dr Erica Little.  Receiving Orencia.  Discussed COVID and importance of social distancing, etc.      ROS: See pertinent positives and negatives per HPI.  Past Medical  History:  Diagnosis Date  . Family history of brain aneurysm   . GERD (gastroesophageal reflux disease)   . Hyperlipidemia   . Hypertension   . MRSA (methicillin resistant Staphylococcus aureus)    skin infections  . Rheumatoid arthritis(714.0)    transaminitis on MTX, remicade rxn, s/p sulfasalazine, orencia  . Ulcer disease     Past Surgical History:  Procedure Laterality Date  . CHOLECYSTECTOMY  2010  . INTRACRANIAL ANEURYSM REPAIR  05/29/2016  . MCP replacements  2001   left  . TUBAL LIGATION      Family History  Problem Relation Age of Onset  . Heart disease Mother   . Diabetes Mother   . Hypercholesterolemia Mother   . Heart disease Father   . Heart disease Brother        MI age 60  . Hypercholesterolemia Sister   . Cancer Sister   . Colon polyps Sister   . Arthritis/Rheumatoid Paternal Aunt   . Arthritis/Rheumatoid Paternal Uncle   . Breast cancer Neg Hx     SOCIAL HX: reviewed.    Current Outpatient Medications:  .  abatacept (ORENCIA) 250 MG injection, Once every month, Disp: , Rfl:  .  albuterol (PROVENTIL HFA;VENTOLIN HFA) 108 (90 BASE) MCG/ACT inhaler, Inhale 2 puffs into the lungs every 6 (six) hours as needed for wheezing or shortness of breath., Disp: 1 Inhaler, Rfl: 2 .  amLODipine (NORVASC) 2.5 MG tablet, TAKE 1 TABLET BY MOUTH EVERY DAY, Disp: 90 tablet,  Rfl: 1 .  aspirin 81 MG chewable tablet, Chew 81 mg by mouth daily., Disp: , Rfl:  .  calcium gluconate 650 MG tablet, Take 650 mg by mouth daily., Disp: , Rfl:  .  cholecalciferol (VITAMIN D) 1000 UNITS tablet, Take 1,000 Units by mouth daily., Disp: , Rfl:  .  dabigatran (PRADAXA) 75 MG CAPS capsule, Take 75 mg by mouth 2 (two) times daily., Disp: , Rfl:  .  ELDERBERRY PO, Take by mouth daily., Disp: , Rfl:  .  fluticasone (FLONASE) 50 MCG/ACT nasal spray, Place 1-2 sprays into both nostrils daily as needed., Disp: 16 g, Rfl: 2 .  lovastatin (MEVACOR) 10 MG tablet, TAKE 1 TABLET BY MOUTH EVERYDAY  AT BEDTIME, Disp: 90 tablet, Rfl: 1 .  magnesium oxide (MAG-OX) 400 MG tablet, Take 400 mg by mouth daily., Disp: , Rfl:  .  methotrexate (RHEUMATREX) 2.5 MG tablet, Take 2.5 mg by mouth once a week. , Disp: , Rfl:  .  methotrexate 50 MG/2ML injection, INJECT 0.2MLS SUBCUTANEOUSLY WEEKLY DISPENSE 2 VIALS, Disp: , Rfl: 1 .  pantoprazole (PROTONIX) 40 MG tablet, TAKE 1 TABLET BY MOUTH EVERY DAY, Disp: 90 tablet, Rfl: 0 .  telmisartan (MICARDIS) 40 MG tablet, Take 1 tablet (40 mg total) by mouth daily., Disp: 90 tablet, Rfl: 1 .  valACYclovir (VALTREX) 1000 MG tablet, Take 1 tablet (1,000 mg total) by mouth 2 (two) times daily., Disp: 20 tablet, Rfl: 0  EXAM:  VITALS per patient if applicable: weight today 103.2 pounds  GENERAL: alert.  Answering questions appropriately.  Sounds to be in no acute distress.    PSYCH/NEURO: pleasant and cooperative, no obvious depression or anxiety, speech and thought processing grossly intact  ASSESSMENT AND PLAN:  Discussed the following assessment and plan:  Aortic atherosclerosis (H[facility])  COPD with chronic bronchitis and emphysema ([facility])  Gastritis without bleeding, unspecified chronicity, unspecified gastritis type  History of cerebral aneurysm   Hypercholesteremia  Hyperglycemia  Essential hypertension  Hyponatremia  Lung nodules  Persistent cough  Rheumatoid arthritis, involving unspecified site, unspecified rheumatoid factor presence (HCC)  Weight loss  Aortic atherosclerosis ([facility]) On lovastatin.    COPD with chronic bronchitis and emphysema ([place]) Followed by pulmonary.  Breathing stable.    Gastritis Previous EGD revealed gastritis.  Off protonix.  Doing well off medication.  Follow.    History of cerebral aneurysm  S/p stenting.  Being followed by Erica Little.  On plavix.  Has f/u 07/18/19.    Hypercholesteremia On lovastatin.  Follow lipid panel and liver function tests.    Hyperglycemia Follow met b and a1c.     Hypertension Blood pressure has been doing well.  Follow pressures.  Follow metabolic panel.    Hyponatremia Seeing nephrology.  Follow metabolic panel.   Lung nodules Saw pulmonary 10/2018.  Recommended f/u chest CT in one year.    Persistent cough Cough is better.  Seeing pulmonary.    Rheumatoid arthritis On MTX and orencia.  Followed by Dr Lavonne Chick.  Stable.    Weight loss Is eating.  Appetite is good.  Has been weighing herself.  Feels weight is stable.  Follow.      I discussed the assessment and treatment plan with the patient. The patient was provided an opportunity to ask questions and all were answered. The patient agreed with the plan and demonstrated an understanding of the instructions.   The patient was advised to call back or seek an in-person evaluation if the symptoms worsen  or if the condition fails to improve as anticipated.  I provided more than 20 minutes of non-face-to-face time during this encounter.   Einar Pheasant, MD

## 2019-04-18 ENCOUNTER — Encounter: Payer: Self-pay | Admitting: Internal Medicine

## 2019-04-18 NOTE — Assessment & Plan Note (Signed)
Saw pulmonary 10/2018.  Recommended f/u chest CT in one year.   

## 2019-04-18 NOTE — Assessment & Plan Note (Signed)
On lovastatin.  Follow lipid panel and liver function tests.   

## 2019-04-18 NOTE — Assessment & Plan Note (Signed)
On MTX and orencia.  Followed by Dr GWK.  Stable.   

## 2019-04-18 NOTE — Assessment & Plan Note (Signed)
On lovastatin 

## 2019-04-18 NOTE — Assessment & Plan Note (Signed)
Seeing nephrology.  Follow metabolic panel.

## 2019-04-18 NOTE — Assessment & Plan Note (Signed)
Is eating.  Appetite is good.  Has been weighing herself.  Feels weight is stable.  Follow.

## 2019-04-18 NOTE — Assessment & Plan Note (Signed)
S/p stenting.  Being followed by [facility].  On plavix.  Has f/u 07/18/19.

## 2019-04-18 NOTE — Assessment & Plan Note (Signed)
Blood pressure has been doing well.  Follow pressures.  Follow metabolic panel.   

## 2019-04-18 NOTE — Assessment & Plan Note (Signed)
Follow met b and a1c.

## 2019-04-18 NOTE — Assessment & Plan Note (Signed)
Cough is better.  Seeing pulmonary.

## 2019-04-18 NOTE — Assessment & Plan Note (Signed)
Followed by pulmonary.  Breathing stable.   

## 2019-04-18 NOTE — Assessment & Plan Note (Signed)
Previous EGD revealed gastritis.  Off protonix.  Doing well off medication.  Follow.

## 2019-04-26 DIAGNOSIS — M0579 Rheumatoid arthritis with rheumatoid factor of multiple sites without organ or systems involvement: Secondary | ICD-10-CM | POA: Diagnosis not present

## 2019-05-11 ENCOUNTER — Other Ambulatory Visit: Payer: Self-pay | Admitting: Internal Medicine

## 2019-05-11 DIAGNOSIS — Z1231 Encounter for screening mammogram for malignant neoplasm of breast: Secondary | ICD-10-CM

## 2019-05-26 DIAGNOSIS — Z79899 Other long term (current) drug therapy: Secondary | ICD-10-CM | POA: Diagnosis not present

## 2019-05-26 DIAGNOSIS — M0579 Rheumatoid arthritis with rheumatoid factor of multiple sites without organ or systems involvement: Secondary | ICD-10-CM | POA: Diagnosis not present

## 2019-06-02 ENCOUNTER — Other Ambulatory Visit: Payer: Self-pay | Admitting: Internal Medicine

## 2019-06-28 DIAGNOSIS — M0579 Rheumatoid arthritis with rheumatoid factor of multiple sites without organ or systems involvement: Secondary | ICD-10-CM | POA: Diagnosis not present

## 2019-06-29 ENCOUNTER — Other Ambulatory Visit: Payer: Self-pay | Admitting: Internal Medicine

## 2019-07-05 ENCOUNTER — Other Ambulatory Visit: Payer: Self-pay

## 2019-07-05 ENCOUNTER — Ambulatory Visit
Admission: RE | Admit: 2019-07-05 | Discharge: 2019-07-05 | Disposition: A | Discharge status: Home / Self Care | Payer: Medicare HMO | Source: Ambulatory Visit | Attending: Internal Medicine | Admitting: Internal Medicine

## 2019-07-05 DIAGNOSIS — Z1231 Encounter for screening mammogram for malignant neoplasm of breast: Secondary | ICD-10-CM

## 2019-07-14 DIAGNOSIS — I671 Cerebral aneurysm, nonruptured: Secondary | ICD-10-CM | POA: Diagnosis not present

## 2019-07-14 DIAGNOSIS — Z9889 Other specified postprocedural states: Secondary | ICD-10-CM | POA: Diagnosis not present

## 2019-07-14 DIAGNOSIS — Z87891 Personal history of nicotine dependence: Secondary | ICD-10-CM | POA: Diagnosis not present

## 2019-07-14 DIAGNOSIS — Z8679 Personal history of other diseases of the circulatory system: Secondary | ICD-10-CM | POA: Diagnosis not present

## 2019-07-20 ENCOUNTER — Ambulatory Visit (INDEPENDENT_AMBULATORY_CARE_PROVIDER_SITE_OTHER): Payer: Medicare HMO | Admitting: Internal Medicine

## 2019-07-20 ENCOUNTER — Encounter: Payer: Self-pay | Admitting: Internal Medicine

## 2019-07-20 ENCOUNTER — Other Ambulatory Visit: Payer: Self-pay

## 2019-07-20 DIAGNOSIS — Z8679 Personal history of other diseases of the circulatory system: Secondary | ICD-10-CM

## 2019-07-20 DIAGNOSIS — Z9889 Other specified postprocedural states: Secondary | ICD-10-CM

## 2019-07-20 DIAGNOSIS — R918 Other nonspecific abnormal finding of lung field: Secondary | ICD-10-CM

## 2019-07-20 DIAGNOSIS — I700 Atherosclerosis of aorta: Secondary | ICD-10-CM

## 2019-07-20 DIAGNOSIS — F439 Reaction to severe stress, unspecified: Secondary | ICD-10-CM

## 2019-07-20 DIAGNOSIS — R739 Hyperglycemia, unspecified: Secondary | ICD-10-CM

## 2019-07-20 DIAGNOSIS — J449 Chronic obstructive pulmonary disease, unspecified: Secondary | ICD-10-CM

## 2019-07-20 DIAGNOSIS — M069 Rheumatoid arthritis, unspecified: Secondary | ICD-10-CM | POA: Diagnosis not present

## 2019-07-20 DIAGNOSIS — R69 Illness, unspecified: Secondary | ICD-10-CM | POA: Diagnosis not present

## 2019-07-20 DIAGNOSIS — E7800 Pure hypercholesterolemia, unspecified: Secondary | ICD-10-CM | POA: Diagnosis not present

## 2019-07-20 DIAGNOSIS — R634 Abnormal weight loss: Secondary | ICD-10-CM

## 2019-07-20 DIAGNOSIS — I10 Essential (primary) hypertension: Secondary | ICD-10-CM | POA: Diagnosis not present

## 2019-07-20 MED ORDER — AMLODIPINE BESYLATE 2.5 MG PO TABS: 2.5000 mg | ORAL_TABLET | Freq: Every day | ORAL | 1 refills | Status: DC

## 2019-07-20 MED ORDER — TELMISARTAN 40 MG PO TABS: 40.0000 mg | ORAL_TABLET | Freq: Every day | ORAL | 1 refills | Status: DC

## 2019-07-20 MED ORDER — LOVASTATIN 10 MG PO TABS: ORAL_TABLET | ORAL | 1 refills | Status: DC

## 2019-07-20 NOTE — Progress Notes (Signed)
Patient ID: Erica Little, female   DOB: 1946-08-13, 73 y.o.   MRN: [id number] ....   Virtual Visit via telephone Note  This visit type was conducted due to national recommendations for restrictions regarding the COVID-19 pandemic (e.g. social distancing).  This format is felt to be most appropriate for this patient at this time.  All issues noted in this document were discussed and addressed.  No physical exam was performed (except for noted visual exam findings with Video Visits).   I connected with Arther Abbott by telephone and verified that I am speaking with the correct person using two identifiers. Location patient: home Location provider: work  Persons participating in the telephone visit: patient, provider  I discussed the limitations, risks, security and privacy concerns of performing an evaluation and management service by telephone and the availability of in person appointments. The patient expressed understanding and agreed to proceed.   Reason for visit: scheduled follow up.    HPI: She reports she is doing relatively well.  Handling stress.  Stays active.  Staying in due to covid restrictions.  No fever.  No chest congestion, cough or sob.  No chest pain.  No acid reflux.  No abdominal pain.  Bowels moving.  Evaluated - f/u lung nodules.  Stable.  Due f/u 10/2018.  Breathing stable.  S/p stenting cerebral aneurysm.  Last evaluated 07/14/19.  Recommended f/u in 2 years.  Blood pressure ok.  Seeing Dr Jefm Bryant - f/u RA.  Weight is stable - 104.2 pounds today.     ROS: See pertinent positives and negatives per HPI.  Past Medical History:  Diagnosis Date  . Family history of brain aneurysm   . GERD (gastroesophageal reflux disease)   . Hyperlipidemia   . Hypertension   . MRSA (methicillin resistant Staphylococcus aureus)    skin infections  . Rheumatoid arthritis(714.0)    transaminitis on MTX, remicade rxn, s/p sulfasalazine, orencia  . Ulcer disease     Past Surgical  History:  Procedure Laterality Date  . CHOLECYSTECTOMY  2010  . INTRACRANIAL ANEURYSM REPAIR  05/29/2016  . MCP replacements  2001   left  . TUBAL LIGATION      Family History  Problem Relation Age of Onset  . Heart disease Mother   . Diabetes Mother   . Hypercholesterolemia Mother   . Heart disease Father   . Heart disease Brother        MI age 71  . Hypercholesterolemia Sister   . Cancer Sister   . Colon polyps Sister   . Arthritis/Rheumatoid Paternal Aunt   . Arthritis/Rheumatoid Paternal Uncle   . Breast cancer Neg Hx     SOCIAL HX: reviewed.    Current Outpatient Medications:  .  abatacept (ORENCIA) 250 MG injection, Once every month, Disp: , Rfl:  .  albuterol (PROVENTIL HFA;VENTOLIN HFA) 108 (90 BASE) MCG/ACT inhaler, Inhale 2 puffs into the lungs every 6 (six) hours as needed for wheezing or shortness of breath., Disp: 1 Inhaler, Rfl: 2 .  amLODipine (NORVASC) 2.5 MG tablet, Take 1 tablet (2.5 mg total) by mouth daily., Disp: 90 tablet, Rfl: 1 .  aspirin 81 MG chewable tablet, Chew 81 mg by mouth daily., Disp: , Rfl:  .  calcium gluconate 650 MG tablet, Take 650 mg by mouth daily., Disp: , Rfl:  .  cholecalciferol (VITAMIN D) 1000 UNITS tablet, Take 1,000 Units by mouth daily., Disp: , Rfl:  .  dabigatran (PRADAXA) 75 MG CAPS capsule, Take 75  mg by mouth 2 (two) times daily., Disp: , Rfl:  .  ELDERBERRY PO, Take by mouth daily., Disp: , Rfl:  .  fluticasone (FLONASE) 50 MCG/ACT nasal spray, Place 1-2 sprays into both nostrils daily as needed., Disp: 16 g, Rfl: 2 .  lovastatin (MEVACOR) 10 MG tablet, TAKE 1 TABLET BY MOUTH EVERYDAY AT BEDTIME, Disp: 90 tablet, Rfl: 1 .  magnesium oxide (MAG-OX) 400 MG tablet, Take 400 mg by mouth daily., Disp: , Rfl:  .  methotrexate (RHEUMATREX) 2.5 MG tablet, Take 2.5 mg by mouth once a week. , Disp: , Rfl:  .  methotrexate 50 MG/2ML injection, INJECT 0.2MLS SUBCUTANEOUSLY WEEKLY DISPENSE 2 VIALS, Disp: , Rfl: 1 .  pantoprazole  (PROTONIX) 40 MG tablet, TAKE 1 TABLET BY MOUTH EVERY DAY, Disp: 90 tablet, Rfl: 3 .  telmisartan (MICARDIS) 40 MG tablet, Take 1 tablet (40 mg total) by mouth daily., Disp: 90 tablet, Rfl: 1 .  valACYclovir (VALTREX) 1000 MG tablet, Take 1 tablet (1,000 mg total) by mouth 2 (two) times daily., Disp: 20 tablet, Rfl: 0  EXAM:  VITALS per patient if applicable: weight 409.8 pounds.   GENERAL: alert.  Answering questions appropriately.  Sounds to be in no acute distress.    PSYCH/NEURO: pleasant and cooperative, no obvious depression or anxiety, speech and thought processing grossly intact  ASSESSMENT AND PLAN:  Discussed the following assessment and plan:  Aortic atherosclerosis (HCC) On lovastatin.    COPD with chronic bronchitis and emphysema ([place]) Evaluated by pulmonary.  Breathing stable.    History of cerebral aneurysm  S/p stenting.  Being followed by [facility].  On plavix.  Last evaluated 07/14/19.  Recommended f/u in 2 years.    Hypercholesteremia On lovastatin.  Low cholesterol diet and exercise.  Follow lipid panel and liver function tests.    Hyperglycemia Low carb diet and exercise.  Follow met b and a1c.    Hypertension Blood pressure under good control.  Continue same medication regimen.  Follow pressures.  Follow metabolic panel.    Lung nodules Saw pulmonary 10/2018.  Stable.  Recommended f/u in one year.    Rheumatoid arthritis On MTX and orencia.  Followed by Dr Lavonne Chick.  Stable.    Stress Discussed with her today.  Stable.  Doing better.  Follow.    Weight loss Weight stable.  Eating well.  Follow.      I discussed the assessment and treatment plan with the patient. The patient was provided an opportunity to ask questions and all were answered. The patient agreed with the plan and demonstrated an understanding of the instructions.   The patient was advised to call back or seek an in-person evaluation if the symptoms worsen or if the  condition fails to improve as anticipated.  I provided 15 minutes of non-face-to-face time during this encounter.   Einar Pheasant, MD

## 2019-07-24 ENCOUNTER — Encounter: Payer: Self-pay | Admitting: Internal Medicine

## 2019-07-24 NOTE — Assessment & Plan Note (Signed)
On lovastatin.  Low cholesterol diet and exercise.  Follow lipid panel and liver function tests.   

## 2019-07-24 NOTE — Assessment & Plan Note (Signed)
On MTX and orencia.  Followed by Dr Lavonne Chick.  Stable.

## 2019-07-24 NOTE — Assessment & Plan Note (Signed)
Weight stable.  Eating well.  Follow.  

## 2019-07-24 NOTE — Assessment & Plan Note (Signed)
Evaluated by pulmonary.  Breathing stable.   

## 2019-07-24 NOTE — Assessment & Plan Note (Signed)
Saw pulmonary 10/2018.  Stable.  Recommended f/u in one year.

## 2019-07-24 NOTE — Assessment & Plan Note (Signed)
Discussed with her today.  Stable.  Doing better.  Follow.

## 2019-07-24 NOTE — Assessment & Plan Note (Signed)
Blood pressure under good control.  Continue same medication regimen.  Follow pressures.  Follow metabolic panel.   

## 2019-07-24 NOTE — Assessment & Plan Note (Signed)
On lovastatin 

## 2019-07-24 NOTE — Assessment & Plan Note (Signed)
Low carb diet and exercise.  Follow met b and a1c.   

## 2019-07-24 NOTE — Assessment & Plan Note (Signed)
S/p stenting.  Being followed by [facility].  On plavix.  Last evaluated 07/14/19.  Recommended f/u in 2 years.

## 2019-07-26 DIAGNOSIS — M0579 Rheumatoid arthritis with rheumatoid factor of multiple sites without organ or systems involvement: Secondary | ICD-10-CM | POA: Diagnosis not present

## 2019-07-27 IMAGING — CT CT CHEST HIGH RESOLUTION W/O CM
2 of 5 series · 14 of 36 positions shown, 17 images · non-contrast
Comparison: No priors.

CLINICAL DATA: 70-year-old female with history of rheumatoid
arthritis presenting with chronic cough. Former smoker. Evaluate for
interstitial lung disease.

EXAM:
CT CHEST WITHOUT CONTRAST
TECHNIQUE: Multidetector CT imaging of the chest was performed following the
standard protocol without intravenous contrast. High resolution
imaging of the lungs, as well as inspiratory and expiratory imaging,
was performed.

[Series 2: thorax · axial · 0.57mm/px · z∈[-555,-281]mm · 11 of 158 slices shown, 14 images]
[im 14/158  mediastinal]
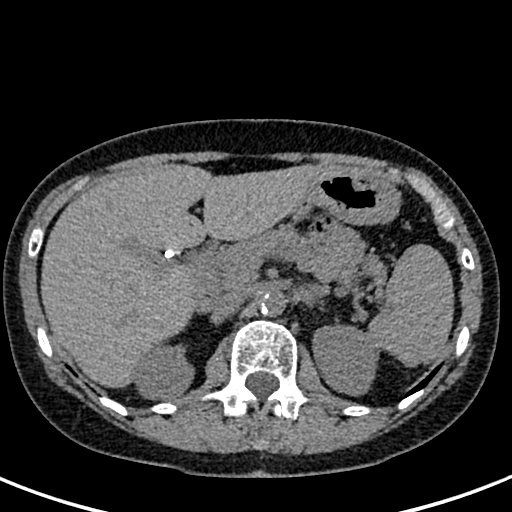
[im 14/158  lung]
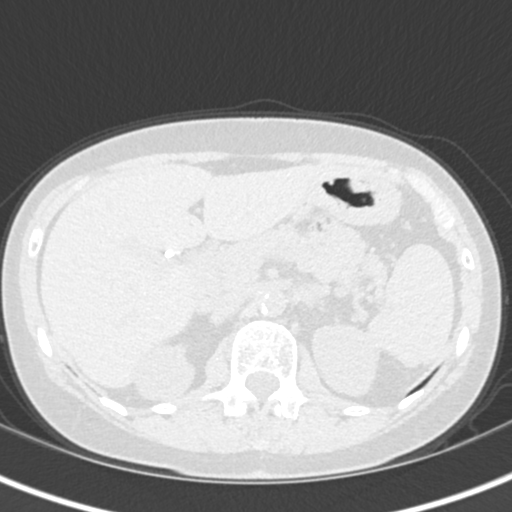
[im 28/158  lung]
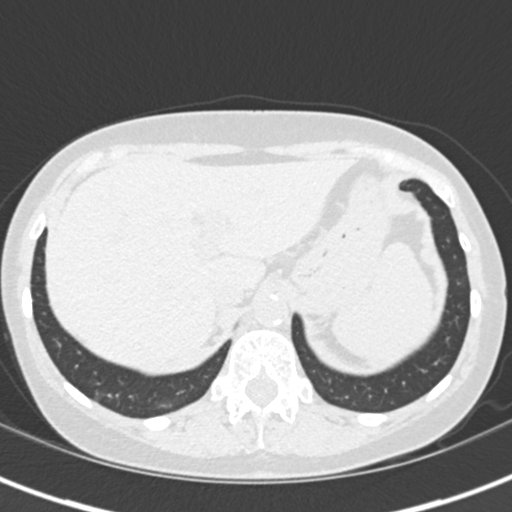
[im 41/158  lung]
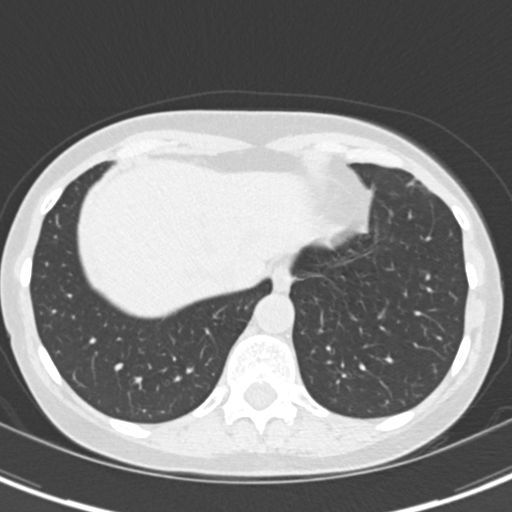
[im 55/158  lung]
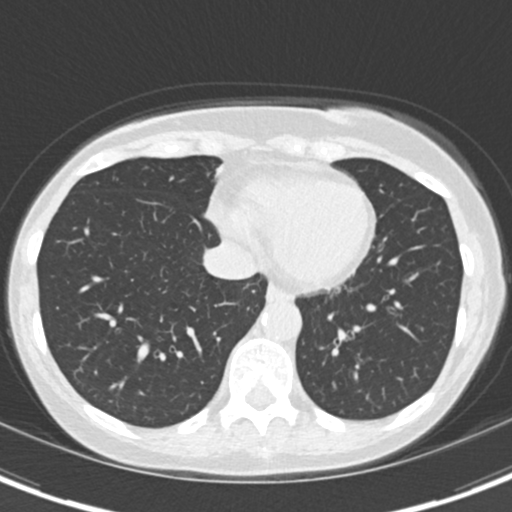
[im 69/158  mediastinal]
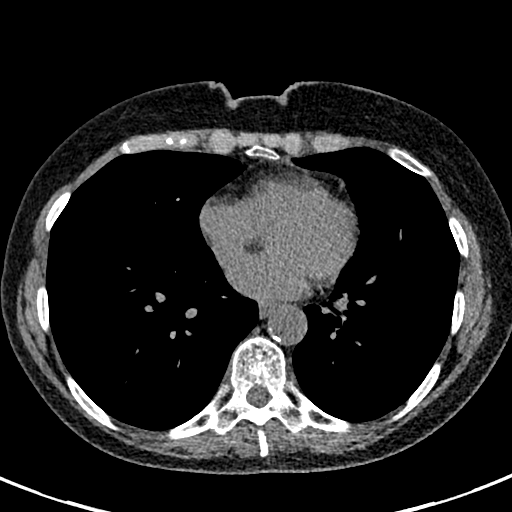
[im 69/158  lung]
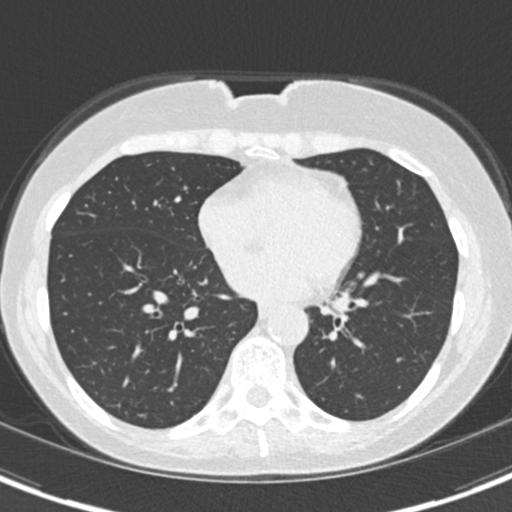
[im 82/158  lung]
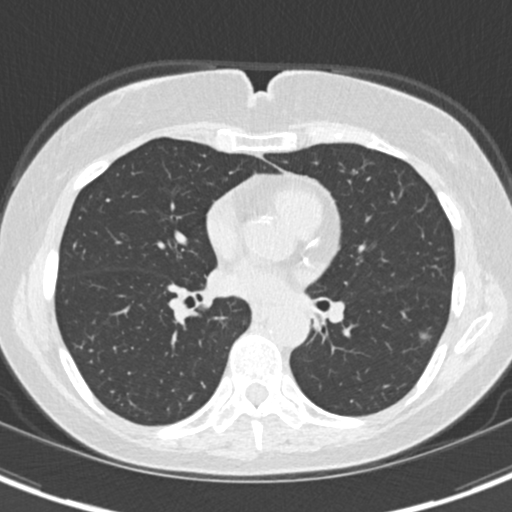
[im 96/158  lung]
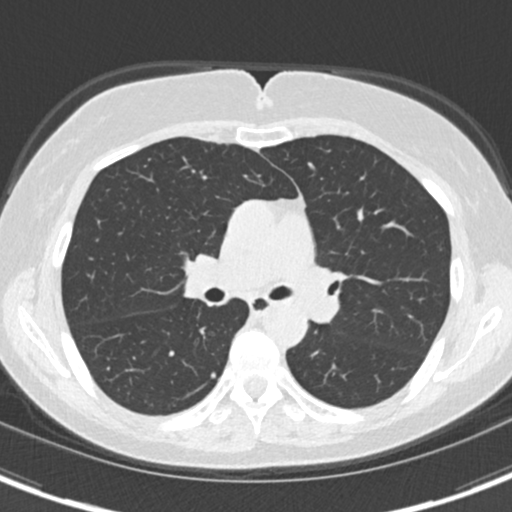
[im 110/158  lung]
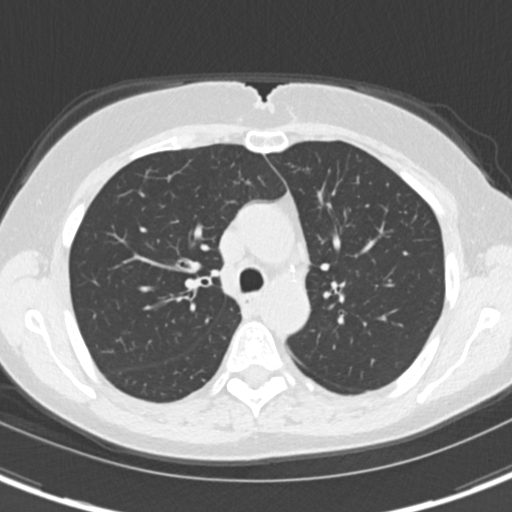
[im 123/158  mediastinal]
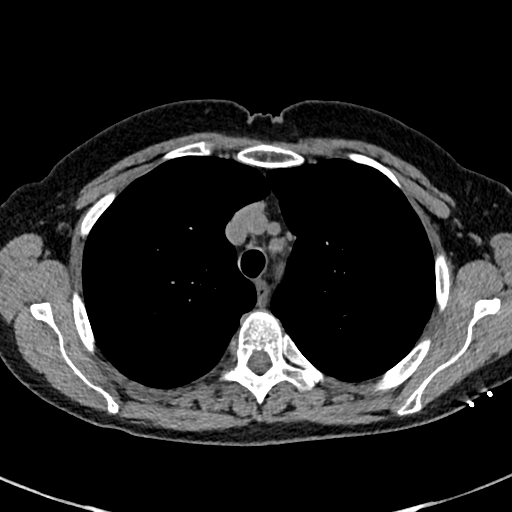
[im 123/158  lung]
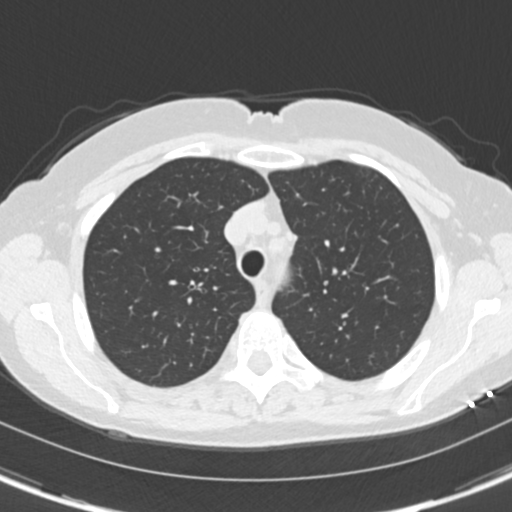
[im 137/158  lung]
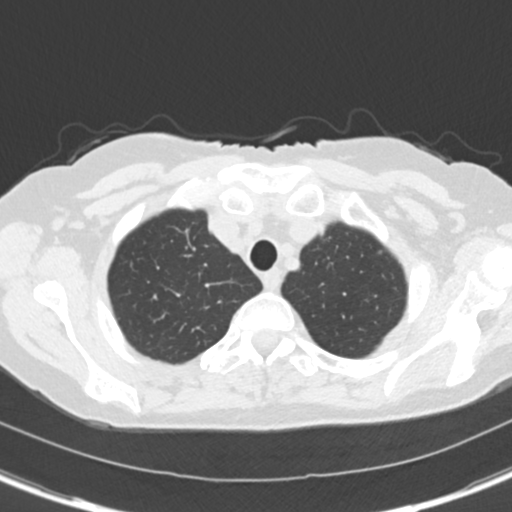
[im 151/158  lung]
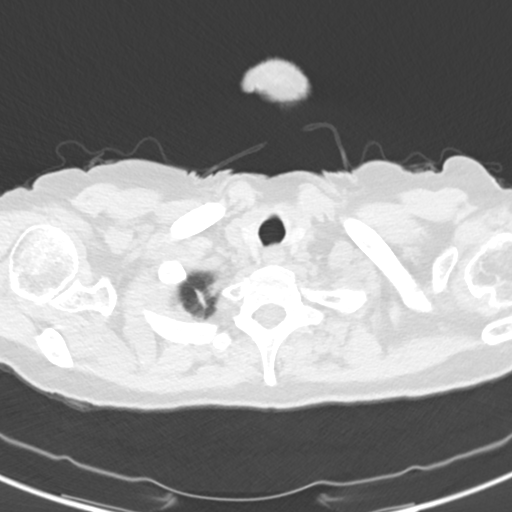

[Series 8: coronal · coronal · 0.59mm/px · 3 of 79 slices shown]
[im 16/79  lung]
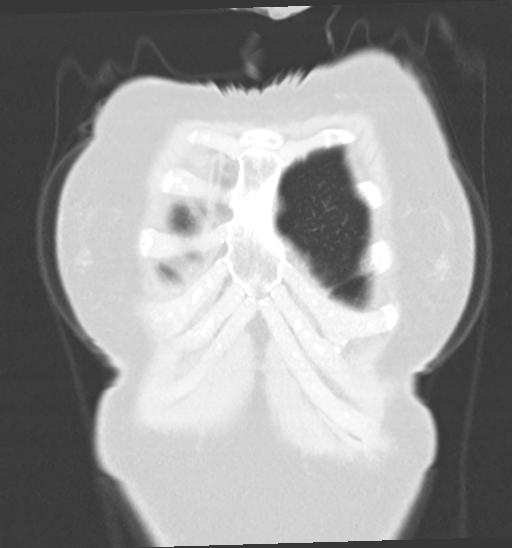
[im 32/79  lung]
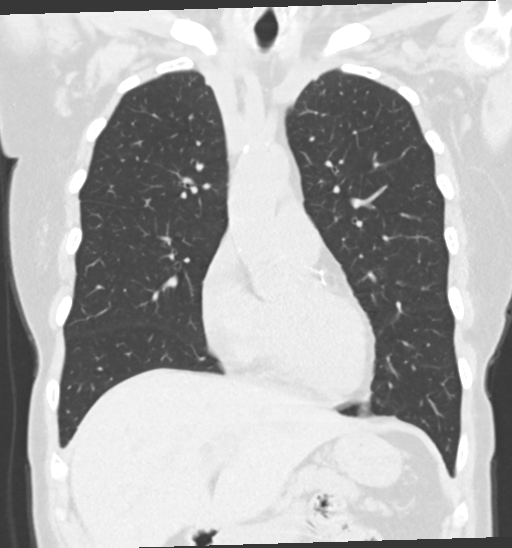
[im 47/79  lung]
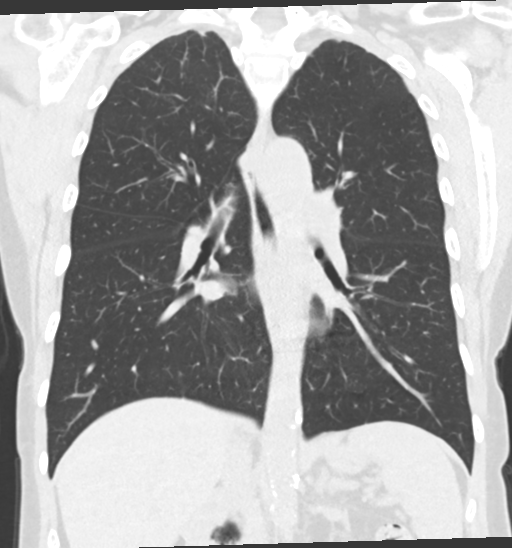

[14 of 36 positions shown; findings below may reference images not displayed]

FINDINGS: Cardiovascular: Heart size is normal. There is no significant
pericardial fluid, thickening or pericardial calcification. There is
aortic atherosclerosis, as well as atherosclerosis of the great
vessels of the mediastinum and the coronary arteries, including
calcified atherosclerotic plaque in the left main, left anterior
descending, left circumflex and right coronary arteries.

Mediastinum/Nodes: No pathologically enlarged mediastinal or hilar
lymph nodes. Please note that accurate exclusion of hilar adenopathy
is limited on noncontrast CT scans. Esophagus is unremarkable in
appearance. No axillary lymphadenopathy.

Lungs/Pleura: Several tiny calcified and noncalcified pulmonary
nodules are noted in the lungs bilaterally. The calcified nodules
are compatible with tiny calcified granulomas. The largest
noncalcified solid-appearing nodule is in the lateral segment of the
right middle lobe (axial image 79 of series 3) measuring 4 mm. The
largest ground-glass attenuation nodule is in the left lower lobe
measuring 8 x 6 mm (axial image 77 of series 3). No other larger
more suspicious appearing pulmonary nodules or masses are noted.
High-resolution images demonstrate no other significant regions of
ground-glass attenuation, subpleural reticulation, parenchymal
banding, traction bronchiectasis or frank honeycombing. Mild diffuse
bronchial wall thickening. Mild bilateral apical nodular
pleuroparenchymal thickening, most compatible with mild post
infectious or inflammatory scarring. Inspiratory and expiratory
imaging is unremarkable.

Upper Abdomen: Status post cholecystectomy. Several small
low-attenuation lesions scattered throughout the liver, incompletely
characterized on today's noncontrast CT examination, but
statistically likely to represent tiny cysts. Aortic
atherosclerosis.

Musculoskeletal: There are no aggressive appearing lytic or blastic
lesions noted in the visualized portions of the skeleton.
IMPRESSION: 1. No findings to suggest interstitial lung disease.
2. Mild diffuse bronchial wall thickening which may suggest chronic
bronchitis.
3. Multiple tiny pulmonary nodules scattered throughout both lungs.
The largest of these is a ground-glass attenuation nodule in the
left lower lobe measuring 8 x 6 mm (mean diameter 7 mm). Initial
follow-up with CT at 6-12 months is recommended to confirm
persistence. If nodules persist, subsequent management will be based
upon the most suspicious nodule(s). This recommendation follows the
consensus statement: Guidelines for Management of Incidental
Pulmonary Nodules Detected on CT Images: From the [HOSPITAL]
4. Aortic atherosclerosis, in addition to left main and 3 vessel
coronary artery disease. Assessment for potential risk factor
modification, dietary therapy or pharmacologic therapy may be
warranted, if clinically indicated.
5. Additional incidental findings, as above.

Aortic Atherosclerosis ([id number]-U7J.J).

## 2019-08-10 ENCOUNTER — Telehealth: Payer: Self-pay

## 2019-08-10 NOTE — Telephone Encounter (Signed)
Patient letting you know that she is having infusion on 8/25 in case you want to add on labs.

## 2019-08-10 NOTE — Telephone Encounter (Signed)
Copied from [place] [id number]. Topic: Quick Communication - See Telephone Encounter >> Aug 10, 2019  3:41 PM Loma Boston wrote: CRM for notification. See Telephone encounter for: 08/10/19. Pt says that she will have her infusion at [facility]  8/25  and she says that Dr Nicki Reaper was going to order her lab work at the same time since she has trouble with blood draws. Wanted to give you a heads up.

## 2019-08-11 NOTE — Telephone Encounter (Signed)
The lab orders have been placed to be drawn here.  Please print these and contact rheumatology's office (Dr Jefm Bryant) and see if they can do these labs.  Dr Jefm Bryant is already drawing some of these, pt just wants to combine labs- so that she only has to be stuck one time.  Thanks

## 2019-08-17 NOTE — Telephone Encounter (Signed)
Verbal given to [facility] at rheumatology. Labs added.

## 2019-08-24 DIAGNOSIS — E7800 Pure hypercholesterolemia, unspecified: Secondary | ICD-10-CM | POA: Diagnosis not present

## 2019-08-24 DIAGNOSIS — M0579 Rheumatoid arthritis with rheumatoid factor of multiple sites without organ or systems involvement: Secondary | ICD-10-CM | POA: Diagnosis not present

## 2019-08-24 DIAGNOSIS — Z79899 Other long term (current) drug therapy: Secondary | ICD-10-CM | POA: Diagnosis not present

## 2019-09-20 ENCOUNTER — Other Ambulatory Visit: Payer: Self-pay | Admitting: Internal Medicine

## 2019-09-20 NOTE — Telephone Encounter (Signed)
Medication Refill - Medication: valACYclovir (VALTREX) 1000 MG tablet    Has the patient contacted their pharmacy? No. (Agent: If no, request that the patient contact the pharmacy for the refill.) (Agent: If yes, when and what did the pharmacy advise?)  Preferred Pharmacy (with phone number or street name):  [id number] - [place], [place] [address]  [address] [place] [address]  Phone: [phone] Fax: [phone]  Not a 24 hour pharmacy; exact hours not known.     Agent: Please be advised that RX refills may take up to 3 business days. We ask that you follow-up with your pharmacy.

## 2019-09-20 NOTE — Telephone Encounter (Signed)
Requested medication (s) are due for refill today: yes  Requested medication (s) are on the active medication list: yes   Future visit scheduled: no  Notes to clinic: review for refill   Requested Prescriptions  Pending Prescriptions Disp Refills   valACYclovir (VALTREX) 1000 MG tablet 20 tablet 0    Sig: Take 1 tablet (1,000 mg total) by mouth 2 (two) times daily.     Antimicrobials:  Antiviral Agents - Anti-Herpetic Passed - 09/20/2019 10:12 AM      Passed - Valid encounter within last 12 months    Recent Outpatient Visits          2 months ago Aortic atherosclerosis [facility])   [facility] [place], [place], MD   5 months ago Aortic atherosclerosis [facility])   [facility] [place], Randell Patient, MD   9 months ago Routine general medical examination at a health care facility   [facility], MD   1 year ago Aortic atherosclerosis [facility])   [facility] [place] Einar Pheasant, MD   1 year ago Aortic atherosclerosis [facility])   [facility] [place], MD

## 2019-09-23 ENCOUNTER — Telehealth: Payer: Self-pay

## 2019-09-23 NOTE — Telephone Encounter (Signed)
Copied from [place] [id number] [id number]. Topic: Quick Communication - Rx Refill/Question >> Sep 23, 2019  3:18 PM Carolyn Stare wrote: Medication  valACYclovir (VALTREX) 1000 MG tablet   [facility] [place]  Agent: Please be advised that RX refills may take up to 3 business days. We ask that you follow-up with your pharmacy.

## 2019-09-24 MED ORDER — VALACYCLOVIR HCL 1 G PO TABS: 1000.0000 mg | ORAL_TABLET | Freq: Two times a day (BID) | ORAL | 0 refills | Status: DC

## 2019-09-24 NOTE — Telephone Encounter (Signed)
rx sent in 

## 2019-09-30 DIAGNOSIS — H2513 Age-related nuclear cataract, bilateral: Secondary | ICD-10-CM | POA: Diagnosis not present

## 2019-10-11 DIAGNOSIS — M0579 Rheumatoid arthritis with rheumatoid factor of multiple sites without organ or systems involvement: Secondary | ICD-10-CM | POA: Diagnosis not present

## 2019-11-15 DIAGNOSIS — M0579 Rheumatoid arthritis with rheumatoid factor of multiple sites without organ or systems involvement: Secondary | ICD-10-CM | POA: Diagnosis not present

## 2019-11-19 ENCOUNTER — Ambulatory Visit
Admission: RE | Admit: 2019-11-19 | Discharge: 2019-11-19 | Disposition: A | Discharge status: Home / Self Care | Payer: Medicare HMO | Source: Ambulatory Visit | Attending: Internal Medicine | Admitting: Internal Medicine

## 2019-11-19 ENCOUNTER — Other Ambulatory Visit: Payer: Self-pay

## 2019-11-19 DIAGNOSIS — R918 Other nonspecific abnormal finding of lung field: Secondary | ICD-10-CM | POA: Diagnosis not present

## 2019-11-23 ENCOUNTER — Ambulatory Visit (INDEPENDENT_AMBULATORY_CARE_PROVIDER_SITE_OTHER): Payer: Medicare HMO | Admitting: Pulmonary Disease

## 2019-11-23 ENCOUNTER — Encounter: Payer: Self-pay | Admitting: Pulmonary Disease

## 2019-11-23 ENCOUNTER — Other Ambulatory Visit: Payer: Self-pay

## 2019-11-23 VITALS — BP 110/68 | HR 71 | Temp 97.5°F | Ht 59.0 in | Wt 105.2 lb

## 2019-11-23 DIAGNOSIS — R05 Cough: Secondary | ICD-10-CM

## 2019-11-23 DIAGNOSIS — R918 Other nonspecific abnormal finding of lung field: Secondary | ICD-10-CM

## 2019-11-23 DIAGNOSIS — R059 Cough, unspecified: Secondary | ICD-10-CM

## 2019-11-23 NOTE — Patient Instructions (Signed)
1.  We will schedule another CT scan of the chest in a year's time.  Follow-up here after that.  Call sooner should any new difficulties arise.

## 2019-11-23 NOTE — Progress Notes (Signed)
Subjective:    Patient ID: Erica Little, female    DOB: 1946/03/29, 73 y.o.   MRN: [id number]  HPI This is a 73 year old former smoker (quit 1990) previously followed by Dr. Ashby Dawes.  I am assuming care as Dr. Ashby Dawes has departed the practice.  She was last seen on 11/18/2018 for follow-up on multiple pulmonary nodules.  The solid components of these nodules were stable however the patient has a 7 mm groundglass nodule on the left lower lobe which will require continued surveillance.  The patient has not had any symptomatology with regards to these nodules.  She has had an issue with chronic cough however this has been well controlled as of late.  She has not had any sputum production or hemoptysis.  No dyspnea, paroxysmal nocturnal dyspnea or orthopnea.  No lower extremity edema.  No fevers, chills or sweats.  No chest pain.  Appetite and weight are stable.  Voices no other complaint.  DATA: CT chest high-resolution 11/18/17: Compared to noncontrasted CT chest 05/19/2018; no evidence of interstitial lung disease, there are several tiny lung nodules, the largest of which is an 8 mm groundglass nodule, there are solid nodules which are 4 mm or less.  Review of the most recent scans shows no significant change from previous, largest nodule in the right middle lobe is 4 mm, largest groundglass nodule in the left lower lobe is 8 mm. PFT 11/18/17: Spirometry FVC 126% of predicted, FEV1 equals 101%, ratio was 65%, there was no bronchodilator administered. Lung volumes show TLC of 135%, vital capacity 126%.  RV to TLC ratio is normal. DLCO is normal at 95%.  Flow volume loop suggests obstruction.  Overall this test is suggestive of mild COPD CT chest 11/18/2018>> Largest nodule is 7 mm appears to be a groundglass nodule.  Review of Systems A 10 point review of systems was performed and it is as noted above otherwise negative.  Past Medical History:  Diagnosis Date  . Family history of  brain aneurysm   . GERD (gastroesophageal reflux disease)   . Hyperlipidemia   . Hypertension   . MRSA (methicillin resistant Staphylococcus aureus)    skin infections  . Rheumatoid arthritis(714.0)    transaminitis on MTX, remicade rxn, s/p sulfasalazine, orencia  . Ulcer disease    Past Surgical History:  Procedure Laterality Date  . CHOLECYSTECTOMY  2010  . INTRACRANIAL ANEURYSM REPAIR  05/29/2016  . MCP replacements  2001   left  . TUBAL LIGATION     Family History  Problem Relation Age of Onset  . Heart disease Mother   . Diabetes Mother   . Hypercholesterolemia Mother   . Heart disease Father   . Heart disease Brother        MI age 38  . Hypercholesterolemia Sister   . Cancer Sister   . Colon polyps Sister   . Arthritis/Rheumatoid Paternal Aunt   . Arthritis/Rheumatoid Paternal Uncle   . Breast cancer Neg Hx    Social History   Tobacco Use  . Smoking status: Former Smoker    Quit date: 12/30/1988    Years since quitting: 31.8  . Smokeless tobacco: Never Used  Substance Use Topics  . Alcohol use: No    Alcohol/week: 0.0 standard drinks   Allergies  Allergen Reactions  . Oxycodone Nausea Only  . Remicade [Infliximab] Other (See Comments)    Lightheadedness  . Simvastatin   . Sulfa Antibiotics   . Zetia [Ezetimibe] Other (See Comments)  Muscle aches   . Latex Rash  . Tape Rash   Current Meds  Medication Sig  . abatacept (ORENCIA) 250 MG injection Once every month  . albuterol (PROVENTIL HFA;VENTOLIN HFA) 108 (90 BASE) MCG/ACT inhaler Inhale 2 puffs into the lungs every 6 (six) hours as needed for wheezing or shortness of breath.  Marland Kitchen aspirin 81 MG chewable tablet Chew 81 mg by mouth daily.  . calcium gluconate 650 MG tablet Take 650 mg by mouth daily.  . cholecalciferol (VITAMIN D) 1000 UNITS tablet Take 1,000 Units by mouth daily.  . dabigatran (PRADAXA) 75 MG CAPS capsule Take 75 mg by mouth 2 (two) times daily.  Marland Kitchen ELDERBERRY PO Take by mouth daily.   . fluticasone (FLONASE) 50 MCG/ACT nasal spray Place 1-2 sprays into both nostrils daily as needed.  . magnesium oxide (MAG-OX) 400 MG tablet Take 400 mg by mouth daily.  . methotrexate 50 MG/2ML injection INJECT 0.2MLS SUBCUTANEOUSLY WEEKLY DISPENSE 2 VIALS  . [DISCONTINUED] amLODipine (NORVASC) 2.5 MG tablet Take 1 tablet (2.5 mg total) by mouth daily.  . [DISCONTINUED] lovastatin (MEVACOR) 10 MG tablet TAKE 1 TABLET BY MOUTH EVERYDAY AT BEDTIME  . [DISCONTINUED] methotrexate (RHEUMATREX) 2.5 MG tablet Take 2.5 mg by mouth once a week.   . [DISCONTINUED] pantoprazole (PROTONIX) 40 MG tablet TAKE 1 TABLET BY MOUTH EVERY DAY  . [DISCONTINUED] telmisartan (MICARDIS) 40 MG tablet Take 1 tablet (40 mg total) by mouth daily.  . [DISCONTINUED] valACYclovir (VALTREX) 1000 MG tablet Take 1 tablet (1,000 mg total) by mouth 2 (two) times daily.    Declines flu vaccine today.     Objective:   Physical Exam BP 110/68 (BP Location: Left Arm, Cuff Size: Normal)   Pulse 71   Temp (!) 97.5 F (36.4 C) (Temporal)   Ht 4\' 11"  (1.499 m)   Wt 105 lb 3.2 oz (47.7 kg)   SpO2 96%   BMI 21.25 kg/m  GENERAL: Slender well-developed well-nourished woman in no acute distress.  Fully ambulatory. HEAD: Normocephalic, atraumatic.  EYES: Pupils equal, round, reactive to light.  No scleral icterus.  MOUTH: Pharynx clear. NECK: Supple. No thyromegaly. Trachea midline. No JVD.  No adenopathy. PULMONARY: Good air entry bilaterally.  No adventitious sounds. CARDIOVASCULAR: S1 and S2. Regular rate and rhythm.  No rubs, murmurs or gallops heard. ABDOMEN: Benign. MUSCULOSKELETAL: No joint deformity, no clubbing, no edema.  NEUROLOGIC: No focal deficit.  Speech is fluent.  Gait is steady. SKIN: Intact,warm,dry. PSYCH: Mood and behavior normal.  Representative slice of CT performed 11/20/2019, groundglass opacity as noted:      Assessment & Plan:     ICD-10-CM   1. Abnormal findings on diagnostic imaging of  lung  R91.8 CT CHEST WO CONTRAST   Multiple nodules Solid nodules are stable Groundglass opacity left lower lobe Repeat CT chest 1 year  2. Cough  R05    This issue has been quiescent     Orders Placed This Encounter  Procedures  . CT CHEST WO CONTRAST    Standing Status:   Future    Standing Expiration Date:   01/22/2021    Scheduling Instructions:     10/2020    Order Specific Question:   Preferred imaging location?    Answer:   [facility]    Order Specific Question:   Radiology Contrast Protocol - do NOT remove file path    Answer:   [email or web address]   Discussion:  Patient has had a groundglass opacity on the left  lower lobe this need reassessment in a years time.  Low-grade carcinomas can have this appearance.  Currently patient is asymptomatic.  Lesion too small to biopsy.  We will continue to follow expectantly.  We will see the patient after CT scan is performed in a years time she is to contact us prior to that time should any new difficulties arise.  Gailen Shelter, MD [facility]   *This note was dictated using voice recognition software/Dragon.  Despite best efforts to proofread, errors can occur which can change the meaning.  Any change was purely unintentional.

## 2019-11-24 ENCOUNTER — Ambulatory Visit (INDEPENDENT_AMBULATORY_CARE_PROVIDER_SITE_OTHER): Payer: Medicare HMO

## 2019-11-24 DIAGNOSIS — Z0000 Encounter for general adult medical examination without abnormal findings: Secondary | ICD-10-CM

## 2019-11-24 NOTE — Patient Instructions (Addendum)
  Erica Little , Thank you for taking time to come for your Medicare Wellness Visit. I appreciate your ongoing commitment to your health goals. Please review the following plan we discussed and let me know if I can assist you in the future.   These are the goals we discussed: Goals    . Follow up with Primary Care Provider     As needed       This is a list of the screening recommended for you and due dates:  Health Maintenance  Topic Date Due  . Tetanus Vaccine  05/07/2020*  . Colon Cancer Screening  11/23/2020*  .  Hepatitis C: One time screening is recommended by Center for Disease Control  (CDC) for  adults born from [birth year] through 1967.   11/23/2020*  . Mammogram  07/04/2020  . DEXA scan (bone density measurement)  Completed  . Pneumonia vaccines  Completed  . Flu Shot  Discontinued  *Topic was postponed. The date shown is not the original due date.

## 2019-11-24 NOTE — Progress Notes (Addendum)
Subjective:   Erica Little is a 73 y.o. female who presents for Medicare Annual (Subsequent) preventive examination.  Review of Systems:  No ROS.  Medicare Wellness Virtual Visit.  Visual/audio telehealth visit, UTA vital signs.   See social history for additional risk factors.   Cardiac Risk Factors include: advanced age (>10455men, 63>65 women);hypertension     Objective:     Vitals: There were no vitals taken for this visit.  There is no height or weight on file to calculate BMI.  Advanced Directives 11/24/2019 11/17/2018 11/06/2017 08/16/2016 11/18/2015  Does Patient Have a Medical Advance Directive? Yes Yes Yes Yes Yes  Type of Estate agentAdvance Directive Healthcare Power of [place]orney;Living will Healthcare Power of [place];Living will Living will;Healthcare Power of Attorney Living will -  Does patient want to make changes to medical advance directive? No - Patient declined No - Patient declined No - Patient declined - -  Copy of Healthcare Power of Attorney in Chart? No - copy requested No - copy requested No - copy requested - -    Tobacco Social History   Tobacco Use  Smoking Status Former Smoker  . Quit date: 12/30/1988  . Years since quitting: 30.9  Smokeless Tobacco Never Used     Counseling given: Not Answered   Clinical Intake:  Pre-visit preparation completed: Yes        Diabetes: No  How often do you need to have someone help you when you read instructions, pamphlets, or other written materials from your doctor or pharmacy?: 1 - Never  Interpreter Needed?: No     Past Medical History:  Diagnosis Date  . Family history of brain aneurysm   . GERD (gastroesophageal reflux disease)   . Hyperlipidemia   . Hypertension   . MRSA (methicillin resistant Staphylococcus aureus)    skin infections  . Rheumatoid arthritis(714.0)    transaminitis on MTX, remicade rxn, s/p sulfasalazine, orencia  . Ulcer disease    Past Surgical History:  Procedure Laterality Date   . CHOLECYSTECTOMY  2010  . INTRACRANIAL ANEURYSM REPAIR  05/29/2016  . MCP replacements  2001   left  . TUBAL LIGATION     Family History  Problem Relation Age of Onset  . Heart disease Mother   . Diabetes Mother   . Hypercholesterolemia Mother   . Heart disease Father   . Heart disease Brother        MI age 75  . Hypercholesterolemia Sister   . Cancer Sister   . Colon polyps Sister   . Arthritis/Rheumatoid Paternal Aunt   . Arthritis/Rheumatoid Paternal Uncle   . Breast cancer Neg Hx    Social History   Socioeconomic History  . Marital status: Widowed    Spouse name: Not on file  . Number of children: 1  . Years of education: Not on file  . Highest education level: Not on file  Occupational History  . Not on file  Social Needs  . Financial resource strain: Not hard at all  . Food insecurity    Worry: Never true    Inability: Never true  . Transportation needs    Medical: No    Non-medical: No  Tobacco Use  . Smoking status: Former Smoker    Quit date: 12/30/1988    Years since quitting: 30.9  . Smokeless tobacco: Never Used  Substance and Sexual Activity  . Alcohol use: No    Alcohol/week: 0.0 standard drinks  . Drug use: No  . Sexual  activity: Yes  Lifestyle  . Physical activity    Days per week: Not on file    Minutes per session: Not on file  . Stress: Not at all  Relationships  . Social Musician on phone: Not on file    Gets together: Not on file    Attends religious service: Not on file    Active member of club or organization: Not on file    Attends meetings of clubs or organizations: Not on file    Relationship status: Widowed  Other Topics Concern  . Not on file  Social History Narrative   Enjoys dancing     Outpatient Encounter Medications as of 11/24/2019  Medication Sig  . abatacept (ORENCIA) 250 MG injection Once every month  . albuterol (PROVENTIL HFA;VENTOLIN HFA) 108 (90 BASE) MCG/ACT inhaler Inhale 2 puffs into the  lungs every 6 (six) hours as needed for wheezing or shortness of breath.  Marland Kitchen amLODipine (NORVASC) 2.5 MG tablet Take 1 tablet (2.5 mg total) by mouth daily.  Marland Kitchen aspirin 81 MG chewable tablet Chew 81 mg by mouth daily.  . calcium gluconate 650 MG tablet Take 650 mg by mouth daily.  . cholecalciferol (VITAMIN D) 1000 UNITS tablet Take 1,000 Units by mouth daily.  . dabigatran (PRADAXA) 75 MG CAPS capsule Take 75 mg by mouth 2 (two) times daily.  Marland Kitchen ELDERBERRY PO Take by mouth daily.  . fluticasone (FLONASE) 50 MCG/ACT nasal spray Place 1-2 sprays into both nostrils daily as needed.  . lovastatin (MEVACOR) 10 MG tablet TAKE 1 TABLET BY MOUTH EVERYDAY AT BEDTIME  . magnesium oxide (MAG-OX) 400 MG tablet Take 400 mg by mouth daily.  . methotrexate 50 MG/2ML injection INJECT 0.2MLS SUBCUTANEOUSLY WEEKLY DISPENSE 2 VIALS  . pantoprazole (PROTONIX) 40 MG tablet TAKE 1 TABLET BY MOUTH EVERY DAY  . telmisartan (MICARDIS) 40 MG tablet Take 1 tablet (40 mg total) by mouth daily.  . valACYclovir (VALTREX) 1000 MG tablet Take 1 tablet (1,000 mg total) by mouth 2 (two) times daily.   No facility-administered encounter medications on file as of 11/24/2019.     Activities of Daily Living In your present state of health, do you have any difficulty performing the following activities: 11/24/2019  Hearing? N  Vision? N  Difficulty concentrating or making decisions? N  Walking or climbing stairs? N  Dressing or bathing? N  Doing errands, shopping? N  Preparing Food and eating ? N  Using the Toilet? N  In the past six months, have you accidently leaked urine? N  Do you have problems with loss of bowel control? N  Managing your Medications? N  Managing your Finances? N  Housekeeping or managing your Housekeeping? N  Some recent data might be hidden    Patient Care Team: Dale [place], MD as PCP - General (Internal Medicine)    Assessment:   This is a routine wellness examination for Erica Little.  Nurse  connected with patient 11/24/19 at 10:30 AM EST by a telephone enabled telemedicine application and verified that I am speaking with the correct person using two identifiers. Patient stated full name and DOB. Patient gave permission to continue with virtual visit. Patient's location was at home and Nurse's location was at [place] office.   Health Maintenance Due: Update all pending maintenance due as appropriate.   See completed HM at the end of note.   Eye: Visual acuity not assessed. Virtual visit. Wears corrective lenses. Followed by their ophthalmologist.  Dental: UTD  Hearing: Demonstrates normal hearing during visit.  Safety:  Patient feels safe at home- yes Patient does have smoke detectors at home- yes Patient does wear sunscreen or protective clothing when in direct sunlight - yes Patient does wear seat belt when in a moving vehicle - yes Patient drives- limited Adequate lighting in walkways free from debris- yes Grab bars and handrails used as appropriate- yes Ambulates with no assistive device Cell phone on person when ambulating outside of the home- yes  Social: Alcohol intake - no  Smoking history- former  Smokers in home? none Illicit drug use? none  Depression: PHQ 2 &9 complete. See screening below. Denies irritability, anhedonia, sadness/tearfullness.    Falls: See screening below.    Medication: Taking as directed and without issues.   Covid-19: Precautions and sickness symptoms discussed. Wears mask, social distancing, hand hygiene as appropriate.   Activities of Daily Living Patient denies needing assistance with: household chores, feeding themselves, getting from bed to chair, getting to the toilet, bathing/showering, dressing, managing money, or preparing meals.   Memory: Patient is alert. Patient denies difficulty focusing or concentrating. Correctly identified the president of the [place], season and recall. Patient likes to read the Bible for  brain stimulation.   BMI- discussed the importance of a healthy diet, water intake and the benefits of aerobic exercise.  Educational material provided.  Physical activity- stretching, incline walking, dancing.  Diet:  Regular Fluid restriction: 42 ounces  Other Providers Patient Care Team: Einar Pheasant, MD as PCP - General (Internal Medicine)  Exercise Activities and Dietary recommendations Current Exercise Habits: Home exercise routine, Type of exercise: walking;stretching, Intensity: Mild  Goals    . Follow up with Primary Care Provider     As needed       Fall Risk Fall Risk  11/24/2019 11/17/2018 11/06/2017 12/05/2016 05/08/2015  Falls in the past year? 0 0 No No No  Follow up Education provided;Falls prevention discussed - - - -   Timed Get Up and Go performed: no, virtual visit  Depression Screen PHQ 2/9 Scores 11/24/2019 11/17/2018 11/06/2017 12/05/2016  PHQ - 2 Score 0 0 0 0  PHQ- 9 Score - - - -     Cognitive Function MMSE - Mini Mental State Exam 11/06/2017  Orientation to time 5  Orientation to Place 5  Registration 3  Attention/ Calculation 5  Recall 3  Language- name 2 objects 2  Language- repeat 1  Language- follow 3 step command 3  Language- read & follow direction 1  Write a sentence 1  Copy design 1  Total score 30     6CIT Screen 11/24/2019 11/17/2018  What Year? 0 points 0 points  What month? 0 points 0 points  What time? 0 points 0 points  Count back from 20 0 points 0 points  Months in reverse 0 points 0 points  Repeat phrase 0 points 0 points  Total Score 0 0    Immunization History  Administered Date(s) Administered  . Pneumococcal Conjugate-13 11/25/2017  . Pneumococcal Polysaccharide-23 12/10/2018   Screening Tests Health Maintenance  Topic Date Due  . TETANUS/TDAP  05/07/2020 (Originally 12/21/1965)  . COLONOSCOPY  11/23/2020 (Originally 09/24/2019)  . Hepatitis C Screening  11/23/2020 (Originally August 07, 1946)  . MAMMOGRAM   07/04/2020  . DEXA SCAN  Completed  . PNA vac Low Risk Adult  Completed  . INFLUENZA VACCINE  Discontinued      Plan:   Keep all routine maintenance appointments.  Medicare Attestation I have personally reviewed: The patient's medical and social history Their use of alcohol, tobacco or illicit drugs Their current medications and supplements The patient's functional ability including ADLs,fall risks, home safety risks, cognitive, and hearing and visual impairment Diet and physical activities Evidence for depression   In addition, I have reviewed and discussed with patient certain preventive protocols, quality metrics, and best practice recommendations. A written personalized care plan for preventive services as well as general preventive health recommendations were provided to patient via mail.     Ashok Pall, LPN  [id number]   Reviewed above information.  Agree with assessment and plan.    Dr Lorin Picket

## 2019-12-10 ENCOUNTER — Other Ambulatory Visit: Payer: Self-pay | Admitting: Internal Medicine

## 2019-12-13 DIAGNOSIS — M0579 Rheumatoid arthritis with rheumatoid factor of multiple sites without organ or systems involvement: Secondary | ICD-10-CM | POA: Diagnosis not present

## 2019-12-13 DIAGNOSIS — Z79899 Other long term (current) drug therapy: Secondary | ICD-10-CM | POA: Diagnosis not present

## 2020-01-18 DIAGNOSIS — M0579 Rheumatoid arthritis with rheumatoid factor of multiple sites without organ or systems involvement: Secondary | ICD-10-CM | POA: Diagnosis not present

## 2020-02-24 ENCOUNTER — Encounter: Payer: Self-pay | Admitting: Internal Medicine

## 2020-02-24 ENCOUNTER — Other Ambulatory Visit: Payer: Self-pay

## 2020-02-24 ENCOUNTER — Ambulatory Visit (INDEPENDENT_AMBULATORY_CARE_PROVIDER_SITE_OTHER): Payer: Medicare HMO | Admitting: Internal Medicine

## 2020-02-24 DIAGNOSIS — J449 Chronic obstructive pulmonary disease, unspecified: Secondary | ICD-10-CM

## 2020-02-24 DIAGNOSIS — R634 Abnormal weight loss: Secondary | ICD-10-CM | POA: Diagnosis not present

## 2020-02-24 DIAGNOSIS — M069 Rheumatoid arthritis, unspecified: Secondary | ICD-10-CM

## 2020-02-24 DIAGNOSIS — N950 Postmenopausal bleeding: Secondary | ICD-10-CM

## 2020-02-24 DIAGNOSIS — J4489 Other specified chronic obstructive pulmonary disease: Secondary | ICD-10-CM

## 2020-02-24 NOTE — Progress Notes (Signed)
Patient ID: Erica Little, female   DOB: February 04, 1946, 74 y.o.   MRN: [id number]   Virtual Visit via telephone Note  This visit type was conducted due to national recommendations for restrictions regarding the COVID-19 pandemic (e.g. social distancing).  This format is felt to be most appropriate for this patient at this time.  All issues noted in this document were discussed and addressed.  No physical exam was performed (except for noted visual exam findings with Video Visits).   I connected with Erica Little by telephone and verified that I am speaking with the correct person using two identifiers. Location patient: home Location provider: work Persons participating in the virtual visit: patient, provider  I discussed the limitations, risks, security and privacy concerns of performing an evaluation and management service by telephone and the availability of in person appointments.  The patient expressed understanding and agreed to proceed.   Reason for visit: work in appt  HPI: She reports that she noticed a few weeks ago some pink vaginal discharge with wiping - after intercourse.  The first episode was three weeks ago.  Intercourse was not painful.  Noticed pink discharge with wiping.  Had another episode recently.  Same - pink discharge with wiping.  Has noticed no blood since.  No abdominal pain or pressure.  No urinary symptoms.  No fever.  Eating and drinking.  Reports weight is stable.  Also reports breathing is ok.  No cough.  Seeing Dr Jayme Cloud.  Last evaluated 10/2019.  Stable.  Recommended f/u chest CT in one year.  On orencia and MTX.  Sees Dr Gavin Potters for her RA.  Stable.     ROS: See pertinent positives and negatives per HPI.  Past Medical History:  Diagnosis Date  . Family history of brain aneurysm   . GERD (gastroesophageal reflux disease)   . Hyperlipidemia   . Hypertension   . MRSA (methicillin resistant Staphylococcus aureus)    skin infections  . Rheumatoid  arthritis(714.0)    transaminitis on MTX, remicade rxn, s/p sulfasalazine, orencia  . Ulcer disease     Past Surgical History:  Procedure Laterality Date  . CHOLECYSTECTOMY  2010  . INTRACRANIAL ANEURYSM REPAIR  05/29/2016  . MCP replacements  2001   left  . TUBAL LIGATION      Family History  Problem Relation Age of Onset  . Heart disease Mother   . Diabetes Mother   . Hypercholesterolemia Mother   . Heart disease Father   . Heart disease Brother        MI age 6  . Hypercholesterolemia Sister   . Cancer Sister   . Colon polyps Sister   . Arthritis/Rheumatoid Paternal Aunt   . Arthritis/Rheumatoid Paternal Uncle   . Breast cancer Neg Hx     SOCIAL HX: reviewed.    Current Outpatient Medications:  .  abatacept (ORENCIA) 250 MG injection, Once every month, Disp: , Rfl:  .  albuterol (PROVENTIL HFA;VENTOLIN HFA) 108 (90 BASE) MCG/ACT inhaler, Inhale 2 puffs into the lungs every 6 (six) hours as needed for wheezing or shortness of breath., Disp: 1 Inhaler, Rfl: 2 .  amLODipine (NORVASC) 2.5 MG tablet, TAKE 1 TABLET BY MOUTH EVERY DAY, Disp: 90 tablet, Rfl: 1 .  aspirin 81 MG chewable tablet, Chew 81 mg by mouth daily., Disp: , Rfl:  .  calcium gluconate 650 MG tablet, Take 650 mg by mouth daily., Disp: , Rfl:  .  cholecalciferol (VITAMIN D) 1000 UNITS tablet, Take  1,000 Units by mouth daily., Disp: , Rfl:  .  dabigatran (PRADAXA) 75 MG CAPS capsule, Take 75 mg by mouth 2 (two) times daily., Disp: , Rfl:  .  ELDERBERRY PO, Take by mouth daily., Disp: , Rfl:  .  fluticasone (FLONASE) 50 MCG/ACT nasal spray, Place 1-2 sprays into both nostrils daily as needed., Disp: 16 g, Rfl: 2 .  lovastatin (MEVACOR) 10 MG tablet, TAKE 1 TABLET BY MOUTH EVERYDAY AT BEDTIME, Disp: 90 tablet, Rfl: 1 .  magnesium oxide (MAG-OX) 400 MG tablet, Take 400 mg by mouth daily., Disp: , Rfl:  .  methotrexate 50 MG/2ML injection, INJECT 0.2MLS SUBCUTANEOUSLY WEEKLY DISPENSE 2 VIALS, Disp: , Rfl: 1 .   pantoprazole (PROTONIX) 40 MG tablet, TAKE 1 TABLET BY MOUTH EVERY DAY, Disp: 90 tablet, Rfl: 3 .  telmisartan (MICARDIS) 40 MG tablet, TAKE 1 TABLET BY MOUTH EVERY DAY, Disp: 90 tablet, Rfl: 1 .  valACYclovir (VALTREX) 1000 MG tablet, Take 1 tablet (1,000 mg total) by mouth 2 (two) times daily., Disp: 20 tablet, Rfl: 0  EXAM:  GENERAL: alert. Sounds to be in no acute distress.  Answering questions appropriately.    PSYCH/NEURO: pleasant and cooperative, no obvious depression or anxiety, speech and thought processing grossly intact  ASSESSMENT AND PLAN:  Discussed the following assessment and plan:  Postmenopausal bleeding Had two episodes - noticed pink vaginal discharge with wiping.  Intercourse not painful.  She was questioning if related to vaginal dryness.  Discussed the need for further evaluation to confirm etiology.  She is in agreement.  Refer to gyn for further evaluation and question of need for pelvic ultrasound, etc.    Rheumatoid arthritis On MTX and orencia.  Stable.  Followed by Dr Jefm Bryant.    Weight loss Reports weight is stable.    COPD with chronic bronchitis and emphysema ([place]) Saw Dr Patsey Berthold 10/2019.  Stable.  Recommended f/u chest CT in one year.     Orders Placed This Encounter  Procedures  . Ambulatory referral to Gynecology    Referral Priority:   Routine    Referral Type:   Consultation    Referral Reason:   Specialty Services Required    Requested Specialty:   Gynecology    Number of Visits Requested:   1     I discussed the assessment and treatment plan with the patient. The patient was provided an opportunity to ask questions and all were answered. The patient agreed with the plan and demonstrated an understanding of the instructions.   The patient was advised to call back or seek an in-person evaluation if the symptoms worsen or if the condition fails to improve as anticipated.  I provided 22 minutes of non-face-to-face time during this  encounter.   Einar Pheasant, MD

## 2020-02-27 ENCOUNTER — Encounter: Payer: Self-pay | Admitting: Internal Medicine

## 2020-02-27 DIAGNOSIS — N950 Postmenopausal bleeding: Secondary | ICD-10-CM | POA: Insufficient documentation

## 2020-02-27 NOTE — Assessment & Plan Note (Signed)
On MTX and orencia.  Stable.  Followed by Dr Gavin Potters.

## 2020-02-27 NOTE — Assessment & Plan Note (Signed)
Had two episodes - noticed pink vaginal discharge with wiping.  Intercourse not painful.  She was questioning if related to vaginal dryness.  Discussed the need for further evaluation to confirm etiology.  She is in agreement.  Refer to gyn for further evaluation and question of need for pelvic ultrasound, etc.

## 2020-02-27 NOTE — Assessment & Plan Note (Signed)
Saw Dr Jayme Cloud 10/2019.  Stable.  Recommended f/u chest CT in one year.

## 2020-02-27 NOTE — Assessment & Plan Note (Signed)
Reports weight is stable.

## 2020-04-06 ENCOUNTER — Other Ambulatory Visit: Payer: Self-pay | Admitting: Internal Medicine

## 2020-04-13 ENCOUNTER — Telehealth: Payer: Self-pay | Admitting: Internal Medicine

## 2020-04-13 NOTE — Telephone Encounter (Signed)
Pt asked if Dr Lorin Picket would send lab order that she needs over to Dr [place] office. She states that she gets her lab work done there all at once since they have a hard time getting blood from her. Please advise.

## 2020-04-14 NOTE — Telephone Encounter (Signed)
Needs an a1c, fasting lipid panel and met b.   Dr Jefm Bryant is with rheumatology at [facility].  He will usually add labs if I need them added.  Please contact [facility] and let them know what labs are needed.  You can call [phone] and ask for rheumatology - Dr Scharlene Gloss nurse and see what we need to do to get orders to them.  Let me know if I need to do anything more.

## 2020-04-14 NOTE — Telephone Encounter (Signed)
Rheumatology closes at noon on Friday.

## 2020-04-17 NOTE — Telephone Encounter (Signed)
Attempted to reach [facility]- line was busy

## 2020-04-21 NOTE — Telephone Encounter (Signed)
Signed in placed on desk.

## 2020-04-21 NOTE — Telephone Encounter (Signed)
Letter printed with orders and dx code to fax to [facility]. Placed on your desk for signature

## 2020-04-21 NOTE — Telephone Encounter (Signed)
Orders faxed to rheumatology

## 2020-05-18 ENCOUNTER — Other Ambulatory Visit: Payer: Self-pay | Admitting: Internal Medicine

## 2020-06-08 ENCOUNTER — Other Ambulatory Visit: Payer: Self-pay | Admitting: Internal Medicine

## 2020-06-08 DIAGNOSIS — Z1231 Encounter for screening mammogram for malignant neoplasm of breast: Secondary | ICD-10-CM

## 2020-07-06 ENCOUNTER — Encounter: Payer: Self-pay | Admitting: Internal Medicine

## 2020-07-06 ENCOUNTER — Ambulatory Visit (INDEPENDENT_AMBULATORY_CARE_PROVIDER_SITE_OTHER): Payer: Medicare HMO | Admitting: Internal Medicine

## 2020-07-06 ENCOUNTER — Other Ambulatory Visit: Payer: Self-pay

## 2020-07-06 VITALS — BP 120/68 | HR 73 | Temp 98.4°F | Resp 16 | Ht 59.0 in | Wt 104.0 lb

## 2020-07-06 DIAGNOSIS — E871 Hypo-osmolality and hyponatremia: Secondary | ICD-10-CM

## 2020-07-06 DIAGNOSIS — N950 Postmenopausal bleeding: Secondary | ICD-10-CM

## 2020-07-06 DIAGNOSIS — Z0000 Encounter for general adult medical examination without abnormal findings: Secondary | ICD-10-CM | POA: Diagnosis not present

## 2020-07-06 DIAGNOSIS — M069 Rheumatoid arthritis, unspecified: Secondary | ICD-10-CM

## 2020-07-06 DIAGNOSIS — R739 Hyperglycemia, unspecified: Secondary | ICD-10-CM

## 2020-07-06 DIAGNOSIS — R918 Other nonspecific abnormal finding of lung field: Secondary | ICD-10-CM

## 2020-07-06 DIAGNOSIS — R634 Abnormal weight loss: Secondary | ICD-10-CM | POA: Diagnosis not present

## 2020-07-06 DIAGNOSIS — I700 Atherosclerosis of aorta: Secondary | ICD-10-CM

## 2020-07-06 DIAGNOSIS — I10 Essential (primary) hypertension: Secondary | ICD-10-CM

## 2020-07-06 DIAGNOSIS — J449 Chronic obstructive pulmonary disease, unspecified: Secondary | ICD-10-CM

## 2020-07-06 DIAGNOSIS — E7800 Pure hypercholesterolemia, unspecified: Secondary | ICD-10-CM

## 2020-07-06 DIAGNOSIS — J4489 Other specified chronic obstructive pulmonary disease: Secondary | ICD-10-CM

## 2020-07-06 NOTE — Progress Notes (Signed)
Patient ID: Erica [facility], female   DOB: 12/13/1946, 74 y.o.   MRN: [id number]   Subjective:    Patient ID: Erica [facility], female    DOB: 1946/05/12, 74 y.o.   MRN: [id number]  HPI This visit occurred during the SARS-CoV-2 [facility] emergency.  Safety protocols were in place, including screening questions prior to the visit, additional usage of staff PPE, and extensive cleaning of exam room while observing appropriate contact time as indicated for disinfecting solutions.  Patient here for her physical exam.  She reports she is doing well.  Feels good. Evaluated by Dr Leafy Ro 06/19/20 for post menopausal bleeding - discharge noticed after intercourse.  Had ultrasound - thin endometrium.  Felt her bleeding was likely due to vaginal atrophy.  Was placed on estrogen cream.  Declines anterior repair or further intervention.  Tries to stay active.  No chest pain or sob.  Breathing stable.  Eating.  No nausea or vomiting.  No abdominal pain.  Bowels moving.  Scheduled for mammogram 07/17/20.    Past Medical History:  Diagnosis Date  . Family history of brain aneurysm   . GERD (gastroesophageal reflux disease)   . Hyperlipidemia   . Hypertension   . MRSA (methicillin resistant Staphylococcus aureus)    skin infections  . Rheumatoid arthritis(714.0)    transaminitis on MTX, remicade rxn, s/p sulfasalazine, orencia  . Ulcer disease    Past Surgical History:  Procedure Laterality Date  . CHOLECYSTECTOMY  2010  . INTRACRANIAL ANEURYSM REPAIR  05/29/2016  . MCP replacements  2001   left  . TUBAL LIGATION     Family History  Problem Relation Age of Onset  . Heart disease Mother   . Diabetes Mother   . Hypercholesterolemia Mother   . Heart disease Father   . Heart disease Brother        MI age 25  . Hypercholesterolemia Sister   . Cancer Sister   . Colon polyps Sister   . Arthritis/Rheumatoid Paternal Aunt   . Arthritis/Rheumatoid Paternal Uncle   . Breast cancer Neg Hx    Social  History   Socioeconomic History  . Marital status: Widowed    Spouse name: Not on file  . Number of children: 1  . Years of education: Not on file  . Highest education level: Not on file  Occupational History  . Not on file  Tobacco Use  . Smoking status: Former Smoker    Quit date: 12/30/1988    Years since quitting: 31.5  . Smokeless tobacco: Never Used  Vaping Use  . Vaping Use: Never used  Substance and Sexual Activity  . Alcohol use: No    Alcohol/week: 0.0 standard drinks  . Drug use: No  . Sexual activity: Yes  Other Topics Concern  . Not on file  Social History Narrative   Enjoys dancing    Social Determinants of Health   Financial Resource Strain:   . Difficulty of Paying Living Expenses:   Food Insecurity:   . Worried About Charity fundraiser in the Last Year:   . Arboriculturist in the Last Year:   Transportation Needs:   . Film/video editor (Medical):   Marland Kitchen Lack of Transportation (Non-Medical):   Physical Activity:   . Days of Exercise per Week:   . Minutes of Exercise per Session:   Stress:   . Feeling of Stress :   Social Connections:   . Frequency of Communication with Friends  and Family:   . Frequency of Social Gatherings with Friends and Family:   . Attends Religious Services:   . Active Member of Clubs or Organizations:   . Attends Archivist Meetings:   Marland Kitchen Marital Status:     Outpatient Encounter Medications as of 07/06/2020  Medication Sig  . abatacept (ORENCIA) 250 MG injection Once every month  . albuterol (PROVENTIL HFA;VENTOLIN HFA) 108 (90 BASE) MCG/ACT inhaler Inhale 2 puffs into the lungs every 6 (six) hours as needed for wheezing or shortness of breath.  Marland Kitchen amLODipine (NORVASC) 2.5 MG tablet TAKE 1 TABLET BY MOUTH EVERY DAY  . aspirin 81 MG chewable tablet Chew 81 mg by mouth daily.  . calcium gluconate 650 MG tablet Take 650 mg by mouth daily.  . cholecalciferol (VITAMIN D) 1000 UNITS tablet Take 1,000 Units by mouth  daily.  . dabigatran (PRADAXA) 75 MG CAPS capsule Take 75 mg by mouth 2 (two) times daily.  Marland Kitchen ELDERBERRY PO Take by mouth daily.  . fluticasone (FLONASE) 50 MCG/ACT nasal spray Place 1-2 sprays into both nostrils daily as needed.  . lovastatin (MEVACOR) 10 MG tablet TAKE 1 TABLET BY MOUTH EVERYDAY AT BEDTIME  . magnesium oxide (MAG-OX) 400 MG tablet Take 400 mg by mouth daily.  . methotrexate 50 MG/2ML injection INJECT 0.2MLS SUBCUTANEOUSLY WEEKLY DISPENSE 2 VIALS  . pantoprazole (PROTONIX) 40 MG tablet TAKE 1 TABLET BY MOUTH EVERY DAY  . telmisartan (MICARDIS) 40 MG tablet TAKE 1 TABLET BY MOUTH EVERY DAY  . valACYclovir (VALTREX) 1000 MG tablet Take 1 tablet (1,000 mg total) by mouth 2 (two) times daily.   No facility-administered encounter medications on file as of 07/06/2020.    Review of Systems  Constitutional: Negative for appetite change and unexpected weight change.  HENT: Negative for congestion and sinus pressure.   Eyes: Negative for pain and visual disturbance.  Respiratory: Negative for cough, chest tightness and shortness of breath.   Cardiovascular: Negative for chest pain, palpitations and leg swelling.  Gastrointestinal: Negative for abdominal pain, diarrhea, nausea and vomiting.  Genitourinary: Negative for difficulty urinating and dysuria.  Musculoskeletal: Negative for joint swelling and myalgias.  Skin: Negative for color change and rash.  Neurological: Negative for dizziness, light-headedness and headaches.  Hematological: Negative for adenopathy. Does not bruise/bleed easily.  Psychiatric/Behavioral: Negative for agitation and dysphoric mood.       Objective:    Physical Exam Vitals reviewed.  Constitutional:      General: She is not in acute distress.    Appearance: Normal appearance. She is well-developed.  HENT:     Head: Normocephalic and atraumatic.     Right Ear: External ear normal.     Left Ear: External ear normal.  Eyes:     General: No  scleral icterus.       Right eye: No discharge.        Left eye: No discharge.     Conjunctiva/sclera: Conjunctivae normal.  Neck:     Thyroid: No thyromegaly.  Cardiovascular:     Rate and Rhythm: Normal rate and regular rhythm.  Pulmonary:     Effort: No tachypnea, accessory muscle usage or respiratory distress.     Breath sounds: Normal breath sounds. No decreased breath sounds or wheezing.  Chest:     Breasts:        Right: No inverted nipple, mass, nipple discharge or tenderness (no axillary adenopathy).        Left: No inverted nipple, mass, nipple  discharge or tenderness (no axilarry adenopathy).  Abdominal:     General: Bowel sounds are normal.     Palpations: Abdomen is soft.     Tenderness: There is no abdominal tenderness.  Musculoskeletal:        General: No swelling or tenderness.     Cervical back: Neck supple. No tenderness.  Lymphadenopathy:     Cervical: No cervical adenopathy.  Skin:    Findings: No erythema or rash.  Neurological:     Mental Status: She is alert and oriented to person, place, and time.  Psychiatric:        Mood and Affect: Mood normal.        Behavior: Behavior normal.     BP 120/68   Pulse 73   Temp 98.4 F (36.9 C)   Resp 16   Ht _0  (1.499 m)   Wt 104 lb (47.2 kg)   SpO2 98%   BMI 21.01 kg/m  Wt Readings from Last 3 Encounters:  07/06/20 104 lb (47.2 kg)  11/23/19 105 lb 3.2 oz (47.7 kg)  07/20/19 106 lb (48.1 kg)     Lab Results  Component Value Date   WBC 5.8 08/27/2018   HGB 13.1 08/27/2018   HCT 39.5 08/27/2018   PLT 206.0 08/27/2018   GLUCOSE 82 01/05/2019   CHOL 186 01/05/2019   TRIG 135.0 01/05/2019   HDL 48.80 01/05/2019   LDLDIRECT 167.8 12/20/2013   LDLCALC 110 (H) 01/05/2019   ALT 11 01/05/2019   AST 17 01/05/2019   NA 134 (L) 01/05/2019   K 4.5 01/05/2019   CL 101 01/05/2019   CREATININE 0.89 01/05/2019   BUN 11 01/05/2019   CO2 27 01/05/2019   TSH 3.19 01/05/2019   INR 1.1 (H) 12/06/2013    HGBA1C 5.7 01/05/2019    CT CHEST WO CONTRAST  Result Date: 11/20/2019 CLINICAL DATA:  Follow-up indeterminate pulmonary nodules. Former smoker. EXAM: CT CHEST WITHOUT CONTRAST TECHNIQUE: Multidetector CT imaging of the chest was performed following the standard protocol without IV contrast. COMPARISON:  11/16/2018, 05/19/2018, and 11/18/2017 FINDINGS: Cardiovascular: No acute findings. Aortic and coronary artery atherosclerosis. Mediastinum/Nodes: No masses or pathologically enlarged lymph nodes identified on this unenhanced exam. Lungs/Pleura: A few tiny sub-cm solid nodules in the right lung remain stable. A 7 mm ground-glass nodule is again seen superior left lower lobe on image 77/5, which shows no significant change in size and appearance dating back to previous study on 11/18/2017. No new or enlarging pulmonary nodules or masses identified. No evidence of pulmonary infiltrate or pleural effusion. Upper Abdomen:  Unremarkable. Musculoskeletal:  No suspicious bone lesions. IMPRESSION: Stable 7 mm ground-glass nodule in superior left lower lobe. Low-grade adenocarcinoma cannot be excluded. Continued follow-up by chest CT is recommended in 2 years. This recommendation follows the consensus statement: Guidelines for Management of Small Pulmonary Nodules Detected on CT Images: From the Fleischner Society 2017; Radiology 2017; 284:228-243. Aortic Atherosclerosis (ICD10-I70.0). Coronary artery atherosclerosis. Electronically Signed   By: Marlaine Hind M.D.   On: 11/20/2019 12:33       Assessment & Plan:   Problem List Items Addressed This Visit    Aortic atherosclerosis ([place])    On lovastatin.        COPD with chronic bronchitis and emphysema (HCC)    Seeing Dr Patsey Berthold.  Last evaluated 10/2019.  Recommended f/u in one year.        Hypercholesteremia    On lovastatin.  Follow lipid panel and liver  function tests.        Hyperglycemia    Follow met b and a1c.        Hypertension    Blood  pressure doing well.  On micardis.  Follow metabolic panel and pressures.        Hyponatremia    Has seen nephrology.  Follow metabolic panel.       Lung nodules    Followed by pulmonary.  Last evaluated 10/2019.  CT as outlined.  Recommended f/u in one year.        Postmenopausal bleeding    Worked up by Dr Leafy Ro.  Ultrasound - no thickened endometrium.  Felt to be related to vaginal atrophy.  Estrogen cream.  Follow.        Rheumatoid arthritis ([place])    On MTX and orencia.  Followed by Dr Jefm Bryant.  Stable.       Weight loss    States she is eatign well.  Good appetite.  Currently without symptoms.  Follow.            Einar Pheasant, MD

## 2020-07-11 ENCOUNTER — Encounter: Payer: Self-pay | Admitting: Internal Medicine

## 2020-07-11 NOTE — Assessment & Plan Note (Signed)
Has seen nephrology.  Follow metabolic panel.   

## 2020-07-11 NOTE — Assessment & Plan Note (Signed)
On lovastatin 

## 2020-07-11 NOTE — Assessment & Plan Note (Signed)
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  Good appetite.  Currently without symptoms.  Follow.

## 2020-07-11 NOTE — Assessment & Plan Note (Signed)
Followed by pulmonary.  Last evaluated 10/2019.  CT as outlined.  Recommended f/u in one year.

## 2020-07-11 NOTE — Assessment & Plan Note (Signed)
Follow met b and a1c.

## 2020-07-11 NOTE — Assessment & Plan Note (Signed)
On MTX and orencia.  Followed by Dr Gavin Potters.  Stable.

## 2020-07-11 NOTE — Assessment & Plan Note (Signed)
On lovastatin.  Follow lipid panel and liver function tests.   

## 2020-07-11 NOTE — Assessment & Plan Note (Signed)
Worked up by Dr Beasley.  Ultrasound - no thickened endometrium.  Felt to be related to vaginal atrophy.  Estrogen cream.  Follow.   

## 2020-07-11 NOTE — Assessment & Plan Note (Signed)
Blood pressure doing well.  On micardis.  Follow metabolic panel and pressures.

## 2020-07-11 NOTE — Assessment & Plan Note (Signed)
Seeing Dr Jayme Cloud.  Last evaluated 10/2019.  Recommended f/u in one year.

## 2020-07-17 ENCOUNTER — Other Ambulatory Visit: Payer: Self-pay

## 2020-07-17 ENCOUNTER — Ambulatory Visit
Admission: RE | Admit: 2020-07-17 | Discharge: 2020-07-17 | Disposition: A | Discharge status: Home / Self Care | Payer: Medicare HMO | Source: Ambulatory Visit | Attending: Internal Medicine | Admitting: Internal Medicine

## 2020-07-17 DIAGNOSIS — Z1231 Encounter for screening mammogram for malignant neoplasm of breast: Secondary | ICD-10-CM

## 2020-09-21 ENCOUNTER — Telehealth: Payer: Self-pay | Admitting: Internal Medicine

## 2020-09-21 NOTE — Telephone Encounter (Signed)
Patient called in stated that she has a hang nail that is infected and on her left leg she has a rash  She wanted to know if Dr.Scott could call in her antibiotic.

## 2020-09-22 NOTE — Telephone Encounter (Signed)
Called patient and advised that Dr Lorin Picket typically does not prescribe abx without a visit and no work in appts available today. Should not wait through the weekend. Gave her the info and phone number to urgent care. She is going to call them and make an appointment.

## 2020-09-22 NOTE — Telephone Encounter (Signed)
Pt called again about hang nail/pt wants a call back to know if she should go to urgent care

## 2020-09-27 ENCOUNTER — Other Ambulatory Visit: Payer: Self-pay | Admitting: Internal Medicine

## 2020-10-25 ENCOUNTER — Telehealth: Payer: Self-pay | Admitting: Internal Medicine

## 2020-10-25 MED ORDER — VALACYCLOVIR HCL 1 G PO TABS
1000.0000 mg | ORAL_TABLET | Freq: Every day | ORAL | 0 refills | Status: DC
Start: 2020-10-25 — End: 2020-11-15

## 2020-10-25 NOTE — Telephone Encounter (Signed)
Pt notified & had already picked up Rx. She is aware of directions

## 2020-10-25 NOTE — Telephone Encounter (Signed)
Patient called in about herpes and she need Dr.Scott to call in the prescription that was called in before.

## 2020-10-25 NOTE — Telephone Encounter (Signed)
Ok to refill valtrex for herpes outbreak? Have pended order if OK. Last OV 07/06/20 last fill 09/24/19

## 2020-10-25 NOTE — Telephone Encounter (Signed)
I have sent in rx for valtrex.  Given is recurrence, she can take one tablet q day for five days.  (I sent in 10 pills, she she would have another 5 days if has another outbreak).

## 2020-11-01 ENCOUNTER — Encounter: Payer: Self-pay | Admitting: Pulmonary Disease

## 2020-11-06 ENCOUNTER — Ambulatory Visit: Payer: Medicare HMO | Admitting: Internal Medicine

## 2020-11-10 ENCOUNTER — Ambulatory Visit: Payer: Medicare HMO

## 2020-11-15 ENCOUNTER — Ambulatory Visit: Payer: Medicare HMO | Admitting: Internal Medicine

## 2020-11-15 ENCOUNTER — Other Ambulatory Visit: Payer: Self-pay

## 2020-11-15 ENCOUNTER — Encounter: Payer: Self-pay | Admitting: Internal Medicine

## 2020-11-15 DIAGNOSIS — M069 Rheumatoid arthritis, unspecified: Secondary | ICD-10-CM

## 2020-11-15 DIAGNOSIS — R739 Hyperglycemia, unspecified: Secondary | ICD-10-CM

## 2020-11-15 DIAGNOSIS — J449 Chronic obstructive pulmonary disease, unspecified: Secondary | ICD-10-CM

## 2020-11-15 DIAGNOSIS — Z8679 Personal history of other diseases of the circulatory system: Secondary | ICD-10-CM

## 2020-11-15 DIAGNOSIS — Z9889 Other specified postprocedural states: Secondary | ICD-10-CM

## 2020-11-15 DIAGNOSIS — I700 Atherosclerosis of aorta: Secondary | ICD-10-CM

## 2020-11-15 DIAGNOSIS — A6000 Herpesviral infection of urogenital system, unspecified: Secondary | ICD-10-CM

## 2020-11-15 DIAGNOSIS — I10 Essential (primary) hypertension: Secondary | ICD-10-CM

## 2020-11-15 DIAGNOSIS — R918 Other nonspecific abnormal finding of lung field: Secondary | ICD-10-CM

## 2020-11-15 DIAGNOSIS — E7800 Pure hypercholesterolemia, unspecified: Secondary | ICD-10-CM

## 2020-11-15 MED ORDER — VALACYCLOVIR HCL 1 G PO TABS
1000.0000 mg | ORAL_TABLET | Freq: Every day | ORAL | 3 refills | Status: DC
Start: 2020-11-15 — End: 2021-02-08

## 2020-11-15 NOTE — Progress Notes (Signed)
Patient ID: Erica [facility], female   DOB: 02/21/1946, 74 y.o.   MRN: [id number]   Subjective:    Patient ID: Erica [facility], female    DOB: 09/10/1946, 74 y.o.   MRN: [id number]  HPI This visit occurred during the SARS-CoV-2 public health emergency.  Safety protocols were in place, including screening questions prior to the visit, additional usage of staff PPE, and extensive cleaning of exam room while observing appropriate contact time as indicated for disinfecting solutions.  Patient here for a scheduled follow up.  She is doing well.  She is walking.  Dancing.  No chest pain or sob with increased activity or exertion.  Previously had abscess finger.  Treated with doxycycline.  Better now.  Sees Dr Jefm Bryant for f/u RA.  On orencia.  Eating.  States has a good appetite.  Monitors her weight.  No acid reflux, abdominal pain or bowel change.     Past Medical History:  Diagnosis Date  . Family history of brain aneurysm   . GERD (gastroesophageal reflux disease)   . Hyperlipidemia   . Hypertension   . MRSA (methicillin resistant Staphylococcus aureus)    skin infections  . Rheumatoid arthritis(714.0)    transaminitis on MTX, remicade rxn, s/p sulfasalazine, orencia  . Ulcer disease    Past Surgical History:  Procedure Laterality Date  . CHOLECYSTECTOMY  2010  . INTRACRANIAL ANEURYSM REPAIR  05/29/2016  . MCP replacements  2001   left  . TUBAL LIGATION     Family History  Problem Relation Age of Onset  . Heart disease Mother   . Diabetes Mother   . Hypercholesterolemia Mother   . Heart disease Father   . Heart disease Brother        MI age 32  . Hypercholesterolemia Sister   . Cancer Sister   . Colon polyps Sister   . Arthritis/Rheumatoid Paternal Aunt   . Arthritis/Rheumatoid Paternal Uncle   . Breast cancer Neg Hx    Social History   Socioeconomic History  . Marital status: Widowed    Spouse name: Not on file  . Number of children: 1  . Years of education: Not  on file  . Highest education level: Not on file  Occupational History  . Not on file  Tobacco Use  . Smoking status: Former Smoker    Quit date: 12/30/1988    Years since quitting: 31.9  . Smokeless tobacco: Never Used  Vaping Use  . Vaping Use: Never used  Substance and Sexual Activity  . Alcohol use: No    Alcohol/week: 0.0 standard drinks  . Drug use: No  . Sexual activity: Yes  Other Topics Concern  . Not on file  Social History Narrative   Enjoys dancing    Social Determinants of Health   Financial Resource Strain:   . Difficulty of Paying Living Expenses: Not on file  Food Insecurity:   . Worried About Charity fundraiser in the Last Year: Not on file  . Ran Out of Food in the Last Year: Not on file  Transportation Needs:   . Lack of Transportation (Medical): Not on file  . Lack of Transportation (Non-Medical): Not on file  Physical Activity:   . Days of Exercise per Week: Not on file  . Minutes of Exercise per Session: Not on file  Stress:   . Feeling of Stress : Not on file  Social Connections:   . Frequency of Communication with Friends and Family: Not  on file  . Frequency of Social Gatherings with Friends and Family: Not on file  . Attends Religious Services: Not on file  . Active Member of Clubs or Organizations: Not on file  . Attends Archivist Meetings: Not on file  . Marital Status: Not on file    Outpatient Encounter Medications as of 11/15/2020  Medication Sig  . abatacept (ORENCIA) 250 MG injection Once every month  . albuterol (PROVENTIL HFA;VENTOLIN HFA) 108 (90 BASE) MCG/ACT inhaler Inhale 2 puffs into the lungs every 6 (six) hours as needed for wheezing or shortness of breath.  Marland Kitchen amLODipine (NORVASC) 2.5 MG tablet TAKE 1 TABLET BY MOUTH EVERY DAY  . aspirin 81 MG chewable tablet Chew 81 mg by mouth daily.  . calcium gluconate 650 MG tablet Take 650 mg by mouth daily.  . cholecalciferol (VITAMIN D) 1000 UNITS tablet Take 1,000 Units by  mouth daily.  . dabigatran (PRADAXA) 75 MG CAPS capsule Take 75 mg by mouth 2 (two) times daily.  Marland Kitchen ELDERBERRY PO Take by mouth daily.  . fluticasone (FLONASE) 50 MCG/ACT nasal spray Place 1-2 sprays into both nostrils daily as needed.  . lovastatin (MEVACOR) 10 MG tablet TAKE 1 TABLET BY MOUTH EVERYDAY AT BEDTIME  . magnesium oxide (MAG-OX) 400 MG tablet Take 400 mg by mouth daily.  . methotrexate 50 MG/2ML injection INJECT 0.2MLS SUBCUTANEOUSLY WEEKLY DISPENSE 2 VIALS  . pantoprazole (PROTONIX) 40 MG tablet TAKE 1 TABLET BY MOUTH EVERY DAY  . telmisartan (MICARDIS) 40 MG tablet TAKE 1 TABLET BY MOUTH EVERY DAY  . valACYclovir (VALTREX) 1000 MG tablet Take 1 tablet (1,000 mg total) by mouth daily.  . [DISCONTINUED] valACYclovir (VALTREX) 1000 MG tablet Take 1 tablet (1,000 mg total) by mouth daily. Take one tablet q day for 5 days.  May repeat if return of symptoms.   No facility-administered encounter medications on file as of 11/15/2020.    Review of Systems  Constitutional: Negative for appetite change and unexpected weight change.  HENT: Negative for congestion and sinus pressure.   Respiratory: Negative for cough, chest tightness and shortness of breath.   Cardiovascular: Negative for chest pain, palpitations and leg swelling.  Gastrointestinal: Negative for abdominal pain, diarrhea, nausea and vomiting.  Genitourinary: Negative for difficulty urinating and dysuria.  Musculoskeletal: Negative for joint swelling and myalgias.  Skin: Negative for color change and rash.  Neurological: Negative for dizziness, light-headedness and headaches.  Psychiatric/Behavioral: Negative for agitation and dysphoric mood.       Objective:    Physical Exam Vitals reviewed.  Constitutional:      General: She is not in acute distress.    Appearance: Normal appearance.  HENT:     Head: Normocephalic and atraumatic.  Eyes:     General: No scleral icterus.       Right eye: No discharge.         Left eye: No discharge.     Conjunctiva/sclera: Conjunctivae normal.  Neck:     Thyroid: No thyromegaly.  Cardiovascular:     Rate and Rhythm: Normal rate and regular rhythm.  Pulmonary:     Effort: No respiratory distress.     Breath sounds: Normal breath sounds. No wheezing.  Abdominal:     General: Bowel sounds are normal.     Palpations: Abdomen is soft.     Tenderness: There is no abdominal tenderness.  Musculoskeletal:        General: No swelling or tenderness.     Cervical  back: Neck supple. No tenderness.  Lymphadenopathy:     Cervical: No cervical adenopathy.  Skin:    Findings: No erythema or rash.  Neurological:     Mental Status: She is alert.  Psychiatric:        Mood and Affect: Mood normal.        Behavior: Behavior normal.     BP 118/70   Pulse 69   Temp 98.2 F (36.8 C) (Oral)   Resp 16   Ht $R'4\' 11"'qS$  (1.499 m)   Wt 103 lb 6.4 oz (46.9 kg)   SpO2 98%   BMI 20.88 kg/m  Wt Readings from Last 3 Encounters:  11/15/20 103 lb 6.4 oz (46.9 kg)  07/06/20 104 lb (47.2 kg)  11/23/19 105 lb 3.2 oz (47.7 kg)     Lab Results  Component Value Date   WBC 5.8 08/27/2018   HGB 13.1 08/27/2018   HCT 39.5 08/27/2018   PLT 206.0 08/27/2018   GLUCOSE 82 01/05/2019   CHOL 186 01/05/2019   TRIG 135.0 01/05/2019   HDL 48.80 01/05/2019   LDLDIRECT 167.8 12/20/2013   LDLCALC 110 (H) 01/05/2019   ALT 11 01/05/2019   AST 17 01/05/2019   NA 134 (L) 01/05/2019   K 4.5 01/05/2019   CL 101 01/05/2019   CREATININE 0.89 01/05/2019   BUN 11 01/05/2019   CO2 27 01/05/2019   TSH 3.19 01/05/2019   INR 1.1 (H) 12/06/2013   HGBA1C 5.7 01/05/2019    MM 3D SCREEN BREAST BILATERAL  Result Date: 07/18/2020 CLINICAL DATA:  Screening. EXAM: DIGITAL SCREENING BILATERAL MAMMOGRAM WITH TOMO AND CAD COMPARISON:  Previous exam(s). ACR Breast Density Category b: There are scattered areas of fibroglandular density. FINDINGS: There are no findings suspicious for malignancy. Images  were processed with CAD. IMPRESSION: No mammographic evidence of malignancy. A result letter of this screening mammogram will be mailed directly to the patient. RECOMMENDATION: Screening mammogram in one year. (Code:SM-B-01Y) BI-RADS CATEGORY  1: Negative. Electronically Signed   By: Dorise Bullion III M.D   On: 07/18/2020 14:10       Assessment & Plan:   Problem List Items Addressed This Visit    Rheumatoid arthritis ([place])    On MTX and orencia.  Followed by Dr Jefm Bryant.  Stable.       Lung nodules    Followed by pulmonary.  Last evaluated 10/2019.  Recommended f/u in one year.       Hypertension    Blood pressure doing well. On micardis.  Follow met b and a1c.       Hyperglycemia    Follow met b and a1c.       Hypercholesteremia    On lovastatin.  Low cholesterol diet and exercise.  Follow lipid panel and liver function tests.       History of cerebral aneurysm     S/p stenting.  Being followed at [facility]. On plavix.  Last evaluated 07/14/19.  Recommended f/u in 2 years.        Genital herpes    Valtrex - suppressive dose.        Relevant Medications   valACYclovir (VALTREX) 1000 MG tablet   COPD with chronic bronchitis and emphysema (HCC)    Seeing Dr Patsey Berthold.  Last evaluated 10/2019.  Recommended f/u in one year.  Breathing stable.        Aortic atherosclerosis (HCC)    On lovastatin.  Aqueelah Cotrell, MD 

## 2020-11-19 ENCOUNTER — Encounter: Payer: Self-pay | Admitting: Internal Medicine

## 2020-11-19 NOTE — Assessment & Plan Note (Signed)
S/p stenting.  Being followed at [facility]. On plavix.  Last evaluated 07/14/19.  Recommended f/u in 2 years.

## 2020-11-19 NOTE — Assessment & Plan Note (Signed)
Blood pressure doing well. On micardis.  Follow met b and a1c.

## 2020-11-19 NOTE — Assessment & Plan Note (Signed)
On lovastatin 

## 2020-11-19 NOTE — Assessment & Plan Note (Signed)
Valtrex - suppressive dose.

## 2020-11-19 NOTE — Assessment & Plan Note (Signed)
Followed by pulmonary.  Last evaluated 10/2019.  Recommended f/u in one year.

## 2020-11-19 NOTE — Assessment & Plan Note (Signed)
On MTX and orencia.  Followed by Dr Gavin Potters.  Stable.

## 2020-11-19 NOTE — Assessment & Plan Note (Signed)
Seeing Dr Jayme Cloud.  Last evaluated 10/2019.  Recommended f/u in one year.  Breathing stable.

## 2020-11-19 NOTE — Assessment & Plan Note (Signed)
Follow met b and a1c.  

## 2020-11-19 NOTE — Assessment & Plan Note (Signed)
On lovastatin.  Low cholesterol diet and exercise.  Follow lipid panel and liver function tests.   

## 2020-11-27 ENCOUNTER — Ambulatory Visit
Admission: RE | Admit: 2020-11-27 | Discharge: 2020-11-27 | Disposition: A | Discharge status: Home / Self Care | Payer: Medicare HMO | Source: Ambulatory Visit | Attending: Pulmonary Disease | Admitting: Pulmonary Disease

## 2020-11-27 ENCOUNTER — Other Ambulatory Visit: Payer: Self-pay

## 2020-11-27 DIAGNOSIS — R918 Other nonspecific abnormal finding of lung field: Secondary | ICD-10-CM | POA: Insufficient documentation

## 2020-11-28 ENCOUNTER — Ambulatory Visit (INDEPENDENT_AMBULATORY_CARE_PROVIDER_SITE_OTHER): Payer: Medicare HMO

## 2020-11-28 VITALS — Ht 59.0 in | Wt 103.0 lb

## 2020-11-28 DIAGNOSIS — Z1159 Encounter for screening for other viral diseases: Secondary | ICD-10-CM | POA: Diagnosis not present

## 2020-11-28 DIAGNOSIS — Z0000 Encounter for general adult medical examination without abnormal findings: Secondary | ICD-10-CM

## 2020-11-28 NOTE — Progress Notes (Addendum)
Subjective:   Erica Little is a 74 y.o. female who presents for Medicare Annual (Subsequent) preventive examination.  Review of Systems    No ROS.  Medicare Wellness Virtual Visit.    Cardiac Risk Factors include: advanced age (>48men, >79 women);hypertension     Objective:    Today's Vitals   11/28/20 1038  Weight: 103 lb (46.7 kg)  Height: 4\' 11"  (1.499 m)   Body mass index is 20.8 kg/m.  Advanced Directives 11/28/2020 11/24/2019 11/17/2018 11/06/2017 08/16/2016 11/18/2015  Does Patient Have a Medical Advance Directive? Yes Yes Yes Yes Yes Yes  Type of 11/20/2015 of [place];Living will Healthcare Power of [place];Living will Healthcare Power of [place];Living will Living will;Healthcare Power of Attorney Living will -  Does patient want to make changes to medical advance directive? No - Patient declined No - Patient declined No - Patient declined No - Patient declined - -  Copy of Healthcare Power of Attorney in Chart? No - copy requested No - copy requested No - copy requested No - copy requested - -    Current Medications (verified) Outpatient Encounter Medications as of 11/28/2020  Medication Sig  . abatacept (ORENCIA) 250 MG injection Once every month  . albuterol (PROVENTIL HFA;VENTOLIN HFA) 108 (90 BASE) MCG/ACT inhaler Inhale 2 puffs into the lungs every 6 (six) hours as needed for wheezing or shortness of breath.  11/30/2020 amLODipine (NORVASC) 2.5 MG tablet TAKE 1 TABLET BY MOUTH EVERY DAY  . aspirin 81 MG chewable tablet Chew 81 mg by mouth daily.  . calcium gluconate 650 MG tablet Take 650 mg by mouth daily.  . cholecalciferol (VITAMIN D) 1000 UNITS tablet Take 1,000 Units by mouth daily.  . dabigatran (PRADAXA) 75 MG CAPS capsule Take 75 mg by mouth 2 (two) times daily.  Marland Kitchen ELDERBERRY PO Take by mouth daily.  . fluticasone (FLONASE) 50 MCG/ACT nasal spray Place 1-2 sprays into both nostrils daily as needed.  . lovastatin (MEVACOR) 10 MG  tablet TAKE 1 TABLET BY MOUTH EVERYDAY AT BEDTIME  . magnesium oxide (MAG-OX) 400 MG tablet Take 400 mg by mouth daily.  . methotrexate 50 MG/2ML injection INJECT 0.2MLS SUBCUTANEOUSLY WEEKLY DISPENSE 2 VIALS  . pantoprazole (PROTONIX) 40 MG tablet TAKE 1 TABLET BY MOUTH EVERY DAY  . telmisartan (MICARDIS) 40 MG tablet TAKE 1 TABLET BY MOUTH EVERY DAY  . valACYclovir (VALTREX) 1000 MG tablet Take 1 tablet (1,000 mg total) by mouth daily.   No facility-administered encounter medications on file as of 11/28/2020.    Allergies (verified) Oxycodone, Remicade [infliximab], Simvastatin, Sulfa antibiotics, Zetia [ezetimibe], Latex, and Tape   History: Past Medical History:  Diagnosis Date  . Family history of brain aneurysm   . GERD (gastroesophageal reflux disease)   . Hyperlipidemia   . Hypertension   . MRSA (methicillin resistant Staphylococcus aureus)    skin infections  . Rheumatoid arthritis(714.0)    transaminitis on MTX, remicade rxn, s/p sulfasalazine, orencia  . Ulcer disease    Past Surgical History:  Procedure Laterality Date  . CHOLECYSTECTOMY  2010  . INTRACRANIAL ANEURYSM REPAIR  05/29/2016  . MCP replacements  2001   left  . TUBAL LIGATION     Family History  Problem Relation Age of Onset  . Heart disease Mother   . Diabetes Mother   . Hypercholesterolemia Mother   . Heart disease Father   . Heart disease Brother        MI age 43  .  Hypercholesterolemia Sister   . Cancer Sister   . Colon polyps Sister   . Arthritis/Rheumatoid Paternal Aunt   . Arthritis/Rheumatoid Paternal Uncle   . Breast cancer Neg Hx    Social History   Socioeconomic History  . Marital status: Widowed    Spouse name: Not on file  . Number of children: 1  . Years of education: Not on file  . Highest education level: Not on file  Occupational History  . Not on file  Tobacco Use  . Smoking status: Former Smoker    Quit date: 12/30/1988    Years since quitting: 31.9  . Smokeless  tobacco: Never Used  Vaping Use  . Vaping Use: Never used  Substance and Sexual Activity  . Alcohol use: No    Alcohol/week: 0.0 standard drinks  . Drug use: No  . Sexual activity: Yes  Other Topics Concern  . Not on file  Social History Narrative   Enjoys dancing    Social Determinants of Health   Financial Resource Strain: Low Risk   . Difficulty of Paying Living Expenses: Not hard at all  Food Insecurity: No Food Insecurity  . Worried About Programme researcher, broadcasting/film/video in the Last Year: Never true  . Ran Out of Food in the Last Year: Never true  Transportation Needs: No Transportation Needs  . Lack of Transportation (Medical): No  . Lack of Transportation (Non-Medical): No  Physical Activity: Sufficiently Active  . Days of Exercise per Week: 5 days  . Minutes of Exercise per Session: 30 min  Stress: No Stress Concern Present  . Feeling of Stress : Not at all  Social Connections: Unknown  . Frequency of Communication with Friends and Family: More than three times a week  . Frequency of Social Gatherings with Friends and Family: More than three times a week  . Attends Religious Services: Not on file  . Active Member of Clubs or Organizations: Not on file  . Attends Banker Meetings: Not on file  . Marital Status: Widowed    Tobacco Counseling Counseling given: Not Answered   Clinical Intake:  Pre-visit preparation completed: Yes        Diabetes: No  How often do you need to have someone help you when you read instructions, pamphlets, or other written materials from your doctor or pharmacy?: 1 - Never   Interpreter Needed?: No      Activities of Daily Living In your present state of health, do you have any difficulty performing the following activities: 11/28/2020  Hearing? N  Vision? N  Difficulty concentrating or making decisions? N  Walking or climbing stairs? N  Dressing or bathing? N  Doing errands, shopping? N  Preparing Food and eating ? N   Using the Toilet? N  In the past six months, have you accidently leaked urine? N  Do you have problems with loss of bowel control? N  Managing your Medications? N  Managing your Finances? N  Housekeeping or managing your Housekeeping? N  Some recent data might be hidden    Patient Care Team: Dale Airmont, MD as PCP - General (Internal Medicine)  Indicate any recent Medical Services you may have received from other than [facility] providers in the past year (date may be approximate).     Assessment:   This is a routine wellness examination for Erica Little.  I connected with Erica Little today by telephone and verified that I am speaking with the correct person using two identifiers. Location  patient: home Location provider: work Persons participating in the virtual visit: patient, Engineer, civil (consulting).    I discussed the limitations, risks, security and privacy concerns of performing an evaluation and management service by telephone and the availability of in person appointments. The patient expressed understanding and verbally consented to this telephonic visit.    Interactive audio and video telecommunications were attempted between this provider and patient, however failed, due to patient having technical difficulties OR patient did not have access to video capability.  We continued and completed visit with audio only.  Some vital signs may be absent or patient reported.   Hearing/Vision screen  Hearing Screening   125Hz  250Hz  500Hz  1000Hz  2000Hz  3000Hz  4000Hz  6000Hz  8000Hz   Right ear:           Left ear:           Comments: Patient is able to hear conversational tones without difficulty. No issues reported.  Vision Screening Comments: Followed by [facility]  Visual acuity not assessed per patient preference since they have regular follow up with the ophthalmologist  Dietary issues and exercise activities discussed: Current Exercise Habits: Home exercise routine, Type of exercise:  calisthenics;stretching, Time (Minutes): 30, Frequency (Times/Week): 5, Weekly Exercise (Minutes/Week): 150, Intensity: Moderate  Goals    . Follow up with Primary Care Provider     As needed      Depression Screen PHQ 2/9 Scores 11/28/2020 11/24/2019 11/17/2018 11/06/2017 12/05/2016 05/08/2015 04/22/2014  PHQ - 2 Score 0 0 0 0 0 0 0  PHQ- 9 Score - - - - - - -    Fall Risk Fall Risk  11/28/2020 11/24/2019 11/17/2018 11/06/2017 12/05/2016  Falls in the past year? 0 0 0 No No  Number falls in past yr: 0 - - - -  Injury with Fall? 0 - - - -  Follow up Falls evaluation completed Education provided;Falls prevention discussed - - -   Handrails in use when climbing stairs? Yes Home free of loose throw rugs in walkways, pet beds, electrical cords, etc? Yes  Adequate lighting in your home to reduce risk of falls? Yes   ASSISTIVE DEVICES UTILIZED TO PREVENT FALLS: Use of a cane, walker or w/c? No   TIMED UP AND GO: Was the test performed? No . Virtual visit.   Cognitive Function: MMSE - Mini Mental State Exam 11/06/2017  Orientation to time 5  Orientation to Place 5  Registration 3  Attention/ Calculation 5  Recall 3  Language- name 2 objects 2  Language- repeat 1  Language- follow 3 step command 3  Language- read & follow direction 1  Write a sentence 1  Copy design 1  Total score 30     6CIT Screen 11/28/2020 11/24/2019 11/17/2018  What Year? 0 points 0 points 0 points  What month? 0 points 0 points 0 points  What time? 0 points 0 points 0 points  Count back from 20 0 points 0 points 0 points  Months in reverse 0 points 0 points 0 points  Repeat phrase 0 points 0 points 0 points  Total Score 0 0 0    Immunizations Immunization History  Administered Date(s) Administered  . PFIZER SARS-COV-2 Vaccination 01/25/2020, 02/15/2020  . Pneumococcal Conjugate-13 11/25/2017  . Pneumococcal Polysaccharide-23 12/10/2018   TDAP status: Due, Education has been provided regarding the  importance of this vaccine. Advised may receive this vaccine at local pharmacy or Health Dept. Aware to provide a copy of the vaccination record if obtained  from local pharmacy or Health Dept. Verbalized acceptance and understanding. Deferred.  Health Maintenance Health Maintenance  Topic Date Due  . Hepatitis C Screening  Never done  . TETANUS/TDAP  Never done  . COLONOSCOPY  11/28/2021 (Originally 09/24/2019)  . MAMMOGRAM  07/17/2021  . DEXA SCAN  Completed  . COVID-19 Vaccine  Completed  . PNA vac Low Risk Adult  Completed  . INFLUENZA VACCINE  Discontinued   Colonoscopy- declined.   Mammogram status: Completed 07/17/20. Repeat every year   Bone Density- completed 07/21/17.   Lung Cancer Screening: (Low Dose CT Chest recommended if Age 53-80 years, 30 pack-year currently smoking OR have quit w/in 15years.) does not qualify.   Hepatitis C Screening: Consent given; future order placed.   Vision Screening: Recommended annual ophthalmology exams for early detection of glaucoma and other disorders of the eye. Is the patient up to date with their annual eye exam?  Yes  Who is the provider or what is the name of the office in which the patient attends annual eye exams? [facility]. Cataract extraction scheduled January 2022.   Dental Screening: Recommended annual dental exams for proper oral hygiene.  Community Resource Referral / Chronic Care Management: CRR required this visit?  No   CCM required this visit?  No      Plan:   Keep all routine maintenance appointments.   Cpe 03/15/20 @ 2:00  I have personally reviewed and noted the following in the patient's chart:   . Medical and social history . Use of alcohol, tobacco or illicit drugs  . Current medications and supplements . Functional ability and status . Nutritional status . Physical activity . Advanced directives . List of other physicians . Hospitalizations, surgeries, and ER visits in previous 12  months . Vitals . Screenings to include cognitive, depression, and falls . Referrals and appointments  In addition, I have reviewed and discussed with patient certain preventive protocols, quality metrics, and best practice recommendations. A written personalized care plan for preventive services as well as general preventive health recommendations were provided to patient.     Ashok Pall, LPN   16/09/[id number]   Nurse Notes: Patient notes taking Valtrex as  daily, not . Unless otherwise directed, she will continue this regimen. Reports  makes her dizzy. However with , no dizziness and working well with her body.    reviewed above information.  Agree with assessment and plan.  Will continue  q day dose.    Dr Lorin Picket

## 2020-11-28 NOTE — Patient Instructions (Addendum)
Erica Little , Thank you for taking time to come for your Medicare Wellness Visit. I appreciate your ongoing commitment to your health goals. Please review the following plan we discussed and let me know if I can assist you in the future.   These are the goals we discussed: Goals    . Follow up with Primary Care Provider     As needed       This is a list of the screening recommended for you and due dates:  Health Maintenance  Topic Date Due  .  Hepatitis C: One time screening is recommended by Center for Disease Control  (CDC) for  adults born from [birth year] through 1967.   Never done  . Tetanus Vaccine  Never done  . Colon Cancer Screening  11/28/2021*  . Mammogram  07/17/2021  . DEXA scan (bone density measurement)  Completed  . COVID-19 Vaccine  Completed  . Pneumonia vaccines  Completed  . Flu Shot  Discontinued  *Topic was postponed. The date shown is not the original due date.    Immunizations Immunization History  Administered Date(s) Administered  . PFIZER SARS-COV-2 Vaccination 01/25/2020, 02/15/2020  . Pneumococcal Conjugate-13 11/25/2017  . Pneumococcal Polysaccharide-23 12/10/2018   Keep all routine maintenance appointments.   Cpe 03/15/20 @ 2:00  Advanced directives: End of life planning; Advance aging; Advanced directives discussed.  Copy of current HCPOA/Living Will requested.    Conditions/risks identified: none new  Follow up in one year for your annual wellness visit    Preventive Care 65 Years and Older, Female Preventive care refers to lifestyle choices and visits with your health care provider that can promote health and wellness. What does preventive care include?  A yearly physical exam. This is also called an annual well check.  Dental exams once or twice a year.  Routine eye exams. Ask your health care provider how often you should have your eyes checked.  Personal lifestyle choices, including:  Daily care of your teeth and gums.  Regular  physical activity.  Eating a healthy diet.  Avoiding tobacco and drug use.  Limiting alcohol use.  Practicing safe sex.  Taking low-dose aspirin every day.  Taking vitamin and mineral supplements as recommended by your health care provider. What happens during an annual well check? The services and screenings done by your health care provider during your annual well check will depend on your age, overall health, lifestyle risk factors, and family history of disease. Counseling  Your health care provider may ask you questions about your:  Alcohol use.  Tobacco use.  Drug use.  Emotional well-being.  Home and relationship well-being.  Sexual activity.  Eating habits.  History of falls.  Memory and ability to understand (cognition).  Work and work Astronomer.  Reproductive health. Screening  You may have the following tests or measurements:  Height, weight, and BMI.  Blood pressure.  Lipid and cholesterol levels. These may be checked every 5 years, or more frequently if you are over 31 years old.  Skin check.  Lung cancer screening. You may have this screening every year starting at age 66 if you have a 30-pack-year history of smoking and currently smoke or have quit within the past 15 years.  Fecal occult blood test (FOBT) of the stool. You may have this test every year starting at age 54.  Flexible sigmoidoscopy or colonoscopy. You may have a sigmoidoscopy every 5 years or a colonoscopy every 10 years starting at age 44.  Hepatitis  C blood test.  Hepatitis B blood test.  Sexually transmitted disease (STD) testing.  Diabetes screening. This is done by checking your blood sugar (glucose) after you have not eaten for a while (fasting). You may have this done every 1-3 years.  Bone density scan. This is done to screen for osteoporosis. You may have this done starting at age 53.  Mammogram. This may be done every 1-2 years. Talk to your health care  provider about how often you should have regular mammograms. Talk with your health care provider about your test results, treatment options, and if necessary, the need for more tests. Vaccines  Your health care provider may recommend certain vaccines, such as:  Influenza vaccine. This is recommended every year.  Tetanus, diphtheria, and acellular pertussis (Tdap, Td) vaccine. You may need a Td booster every 10 years.  Zoster vaccine. You may need this after age 17.  Pneumococcal 13-valent conjugate (PCV13) vaccine. One dose is recommended after age 75.  Pneumococcal polysaccharide (PPSV23) vaccine. One dose is recommended after age 31. Talk to your health care provider about which screenings and vaccines you need and how often you need them. This information is not intended to replace advice given to you by your health care provider. Make sure you discuss any questions you have with your health care provider. Document Released: 01/12/2016 Document Revised: 09/04/2016 Document Reviewed: 10/17/2015 Elsevier Interactive Patient Education  2017 [place] Prevention in the Home Falls can cause injuries. They can happen to people of all ages. There are many things you can do to make your home safe and to help prevent falls. What can I do on the outside of my home?  Regularly fix the edges of walkways and driveways and fix any cracks.  Remove anything that might make you trip as you walk through a door, such as a raised step or threshold.  Trim any bushes or trees on the path to your home.  Use bright outdoor lighting.  Clear any walking paths of anything that might make someone trip, such as rocks or tools.  Regularly check to see if handrails are loose or broken. Make sure that both sides of any steps have handrails.  Any raised decks and porches should have guardrails on the edges.  Have any leaves, snow, or ice cleared regularly.  Use sand or salt on walking paths  during winter.  Clean up any spills in your garage right away. This includes oil or grease spills. What can I do in the bathroom?  Use night lights.  Install grab bars by the toilet and in the tub and shower. Do not use towel bars as grab bars.  Use non-skid mats or decals in the tub or shower.  If you need to sit down in the shower, use a plastic, non-slip stool.  Keep the floor dry. Clean up any water that spills on the floor as soon as it happens.  Remove soap buildup in the tub or shower regularly.  Attach bath mats securely with double-sided non-slip rug tape.  Do not have throw rugs and other things on the floor that can make you trip. What can I do in the bedroom?  Use night lights.  Make sure that you have a light by your bed that is easy to reach.  Do not use any sheets or blankets that are too big for your bed. They should not hang down onto the floor.  Have a firm chair that has side arms.  You can use this for support while you get dressed.  Do not have throw rugs and other things on the floor that can make you trip. What can I do in the kitchen?  Clean up any spills right away.  Avoid walking on wet floors.  Keep items that you use a lot in easy-to-reach places.  If you need to reach something above you, use a strong step stool that has a grab bar.  Keep electrical cords out of the way.  Do not use floor polish or wax that makes floors slippery. If you must use wax, use non-skid floor wax.  Do not have throw rugs and other things on the floor that can make you trip. What can I do with my stairs?  Do not leave any items on the stairs.  Make sure that there are handrails on both sides of the stairs and use them. Fix handrails that are broken or loose. Make sure that handrails are as long as the stairways.  Check any carpeting to make sure that it is firmly attached to the stairs. Fix any carpet that is loose or worn.  Avoid having throw rugs at the top  or bottom of the stairs. If you do have throw rugs, attach them to the floor with carpet tape.  Make sure that you have a light switch at the top of the stairs and the bottom of the stairs. If you do not have them, ask someone to add them for you. What else can I do to help prevent falls?  Wear shoes that:  Do not have high heels.  Have rubber bottoms.  Are comfortable and fit you well.  Are closed at the toe. Do not wear sandals.  If you use a stepladder:  Make sure that it is fully opened. Do not climb a closed stepladder.  Make sure that both sides of the stepladder are locked into place.  Ask someone to hold it for you, if possible.  Clearly mark and make sure that you can see:  Any grab bars or handrails.  First and last steps.  Where the edge of each step is.  Use tools that help you move around (mobility aids) if they are needed. These include:  Canes.  Walkers.  Scooters.  Crutches.  Turn on the lights when you go into a dark area. Replace any light bulbs as soon as they burn out.  Set up your furniture so you have a clear path. Avoid moving your furniture around.  If any of your floors are uneven, fix them.  If there are any pets around you, be aware of where they are.  Review your medicines with your doctor. Some medicines can make you feel dizzy. This can increase your chance of falling. Ask your doctor what other things that you can do to help prevent falls. This information is not intended to replace advice given to you by your health care provider. Make sure you discuss any questions you have with your health care provider. Document Released: 10/12/2009 Document Revised: 05/23/2016 Document Reviewed: 01/20/2015 Elsevier Interactive Patient Education  2017 [employer].

## 2021-01-08 ENCOUNTER — Encounter: Payer: Self-pay | Admitting: Anesthesiology

## 2021-01-08 ENCOUNTER — Encounter: Payer: Self-pay | Admitting: Ophthalmology

## 2021-01-08 ENCOUNTER — Other Ambulatory Visit: Payer: Self-pay

## 2021-01-10 ENCOUNTER — Other Ambulatory Visit: Payer: Self-pay | Admitting: Internal Medicine

## 2021-01-11 ENCOUNTER — Other Ambulatory Visit
Admission: RE | Admit: 2021-01-11 | Discharge: 2021-01-11 | Disposition: A | Discharge status: Home / Self Care | Payer: Medicare HMO | Source: Ambulatory Visit | Attending: Ophthalmology | Admitting: Ophthalmology

## 2021-01-11 ENCOUNTER — Other Ambulatory Visit: Payer: Self-pay

## 2021-01-11 DIAGNOSIS — Z20822 Contact with and (suspected) exposure to covid-19: Secondary | ICD-10-CM | POA: Insufficient documentation

## 2021-01-11 DIAGNOSIS — Z01812 Encounter for preprocedural laboratory examination: Secondary | ICD-10-CM | POA: Diagnosis present

## 2021-01-11 LAB — SARS CORONAVIRUS 2 (TAT 6-24 HRS): SARS Coronavirus 2: NEGATIVE

## 2021-01-11 NOTE — Discharge Instructions (Signed)

## 2021-01-18 ENCOUNTER — Other Ambulatory Visit
Admission: RE | Admit: 2021-01-18 | Discharge: 2021-01-18 | Disposition: A | Discharge status: Home / Self Care | Payer: Medicare HMO | Source: Ambulatory Visit | Attending: Ophthalmology | Admitting: Ophthalmology

## 2021-01-18 ENCOUNTER — Other Ambulatory Visit: Payer: Self-pay

## 2021-01-18 DIAGNOSIS — Z01812 Encounter for preprocedural laboratory examination: Secondary | ICD-10-CM | POA: Diagnosis present

## 2021-01-18 DIAGNOSIS — Z20822 Contact with and (suspected) exposure to covid-19: Secondary | ICD-10-CM | POA: Insufficient documentation

## 2021-01-18 LAB — SARS CORONAVIRUS 2 (TAT 6-24 HRS): SARS Coronavirus 2: NEGATIVE

## 2021-01-22 ENCOUNTER — Encounter
Admission: RE | Disposition: A | Discharge status: Home / Self Care | Payer: Self-pay | Source: Home / Self Care | Attending: Ophthalmology

## 2021-01-22 ENCOUNTER — Other Ambulatory Visit: Payer: Self-pay

## 2021-01-22 ENCOUNTER — Encounter: Payer: Self-pay | Admitting: Anesthesiology

## 2021-01-22 ENCOUNTER — Ambulatory Visit
Admission: RE | Admit: 2021-01-22 | Discharge: 2021-01-22 | Disposition: A | Discharge status: Home / Self Care | Payer: Medicare HMO | Attending: Ophthalmology | Admitting: Ophthalmology

## 2021-01-22 ENCOUNTER — Encounter: Payer: Self-pay | Admitting: Ophthalmology

## 2021-01-22 DIAGNOSIS — Z7982 Long term (current) use of aspirin: Secondary | ICD-10-CM | POA: Diagnosis not present

## 2021-01-22 DIAGNOSIS — Z91040 Latex allergy status: Secondary | ICD-10-CM | POA: Diagnosis not present

## 2021-01-22 DIAGNOSIS — Z885 Allergy status to narcotic agent status: Secondary | ICD-10-CM | POA: Insufficient documentation

## 2021-01-22 DIAGNOSIS — Z888 Allergy status to other drugs, medicaments and biological substances status: Secondary | ICD-10-CM | POA: Insufficient documentation

## 2021-01-22 DIAGNOSIS — H2512 Age-related nuclear cataract, left eye: Secondary | ICD-10-CM | POA: Insufficient documentation

## 2021-01-22 DIAGNOSIS — Z882 Allergy status to sulfonamides status: Secondary | ICD-10-CM | POA: Diagnosis not present

## 2021-01-22 DIAGNOSIS — Z79899 Other long term (current) drug therapy: Secondary | ICD-10-CM | POA: Diagnosis not present

## 2021-01-22 DIAGNOSIS — Z87891 Personal history of nicotine dependence: Secondary | ICD-10-CM | POA: Insufficient documentation

## 2021-01-22 HISTORY — DX: Family history of other specified conditions: Z84.89

## 2021-01-22 HISTORY — PX: CATARACT EXTRACTION W/PHACO: SHX586

## 2021-01-22 SURGERY — PHACOEMULSIFICATION, CATARACT, WITH IOL INSERTION
Anesthesia: Monitor Anesthesia Care | Site: Eye | Laterality: Left

## 2021-01-22 MED ORDER — TETRACAINE HCL 0.5 % OP SOLN
1.0000 [drp] | OPHTHALMIC | Status: DC | PRN
  Administered 2021-01-22 (×3): 1 [drp] via OPHTHALMIC

## 2021-01-22 MED ORDER — LIDOCAINE HCL (PF) 2 % IJ SOLN
INTRAOCULAR | Status: DC | PRN
  Administered 2021-01-22: 1 mL via INTRAOCULAR

## 2021-01-22 MED ORDER — MIDAZOLAM HCL 2 MG/2ML IJ SOLN
INTRAMUSCULAR | Status: DC | PRN
  Administered 2021-01-22: 1.5 mg via INTRAVENOUS

## 2021-01-22 MED ORDER — EPINEPHRINE PF 1 MG/ML IJ SOLN: INTRAOCULAR | Status: DC | PRN

## 2021-01-22 MED ORDER — FENTANYL CITRATE (PF) 100 MCG/2ML IJ SOLN
INTRAMUSCULAR | Status: DC | PRN
  Administered 2021-01-22: 50 ug via INTRAVENOUS

## 2021-01-22 MED ORDER — LACTATED RINGERS IV SOLN: INTRAVENOUS | Status: DC

## 2021-01-22 MED ORDER — ACETAMINOPHEN 325 MG PO TABS: 325.0000 mg | ORAL_TABLET | Freq: Once | ORAL | Status: DC

## 2021-01-22 MED ORDER — ACETAMINOPHEN 160 MG/5ML PO SOLN: 325.0000 mg | Freq: Once | ORAL | Status: DC

## 2021-01-22 MED ORDER — SODIUM HYALURONATE 23 MG/ML IO SOLN
INTRAOCULAR | Status: DC | PRN
  Administered 2021-01-22: 0.6 mL via INTRAOCULAR

## 2021-01-22 MED ORDER — MOXIFLOXACIN HCL 0.5 % OP SOLN
OPHTHALMIC | Status: DC | PRN
  Administered 2021-01-22: 0.2 mL via OPHTHALMIC

## 2021-01-22 MED ORDER — SODIUM HYALURONATE 10 MG/ML IO SOLN
INTRAOCULAR | Status: DC | PRN
  Administered 2021-01-22: 0.55 mL via INTRAOCULAR

## 2021-01-22 MED ORDER — ARMC OPHTHALMIC DILATING DROPS
1.0000 "application " | OPHTHALMIC | Status: DC | PRN
  Administered 2021-01-22 (×3): 1 via OPHTHALMIC

## 2021-01-22 SURGICAL SUPPLY — 17 items
CANNULA ANT/CHMB 27GA (MISCELLANEOUS) ×4 IMPLANT
DISSECTOR HYDRO NUCLEUS 50X22 (MISCELLANEOUS) ×2 IMPLANT
GLOVE SURG LX 7.5 STRW (GLOVE) ×2
GLOVE SURG LX STRL 7.5 STRW (GLOVE) ×2 IMPLANT
GLOVE SURG SYN 8.5  E (GLOVE) ×1
GLOVE SURG SYN 8.5 E (GLOVE) ×1 IMPLANT
GOWN STRL REUS W/ TWL LRG LVL3 (GOWN DISPOSABLE) ×2 IMPLANT
GOWN STRL REUS W/TWL LRG LVL3 (GOWN DISPOSABLE) ×4
LENS IOL TECNIS EYHANCE 17.0 (Intraocular Lens) ×2 IMPLANT
MARKER SKIN DUAL TIP RULER LAB (MISCELLANEOUS) ×2 IMPLANT
PACK DR. KING ARMS (PACKS) ×2 IMPLANT
PACK EYE AFTER SURG (MISCELLANEOUS) ×2 IMPLANT
PACK OPTHALMIC (MISCELLANEOUS) ×2 IMPLANT
SYR 3ML LL SCALE MARK (SYRINGE) ×2 IMPLANT
SYR TB 1ML LUER SLIP (SYRINGE) ×2 IMPLANT
WATER STERILE IRR 250ML POUR (IV SOLUTION) ×2 IMPLANT
WIPE NON LINTING 3.25X3.25 (MISCELLANEOUS) ×2 IMPLANT

## 2021-01-22 NOTE — Anesthesia Procedure Notes (Signed)
Procedure Name: MAC Performed by: Neena Beecham, CRNA Pre-anesthesia Checklist: Patient identified, Emergency Drugs available, Suction available, Timeout performed and Patient being monitored Patient Re-evaluated:Patient Re-evaluated prior to induction Oxygen Delivery Method: Nasal cannula Placement Confirmation: positive ETCO2       

## 2021-01-22 NOTE — Anesthesia Preprocedure Evaluation (Signed)
Anesthesia Evaluation  Patient identified by MRN, date of birth, ID band Patient awake    Reviewed: Allergy & Precautions, H&P , NPO status , Patient's Chart, lab work & pertinent test results  Airway Mallampati: II  TM Distance: >3 FB Neck ROM: full    Dental no notable dental hx.    Pulmonary COPD, former smoker,    Pulmonary exam normal breath sounds clear to auscultation       Cardiovascular hypertension, Normal cardiovascular exam Rhythm:regular Rate:Normal     Neuro/Psych    GI/Hepatic GERD  ,  Endo/Other    Renal/GU      Musculoskeletal RA   Abdominal   Peds  Hematology   Anesthesia Other Findings   Reproductive/Obstetrics                             Anesthesia Physical Anesthesia Plan  ASA: III  Anesthesia Plan: MAC   Post-op Pain Management:    Induction:   PONV Risk Score and Plan: 2 and Treatment may vary due to age or medical condition, TIVA and Midazolam  Airway Management Planned:   Additional Equipment:   Intra-op Plan:   Post-operative Plan:   Informed Consent: I have reviewed the patients History and Physical, chart, labs and discussed the procedure including the risks, benefits and alternatives for the proposed anesthesia with the patient or authorized representative who has indicated his/her understanding and acceptance.     Dental Advisory Given  Plan Discussed with: CRNA  Anesthesia Plan Comments:         Anesthesia Quick Evaluation

## 2021-01-22 NOTE — Transfer of Care (Signed)
Immediate Anesthesia Transfer of Care Note  Patient: Erica Little  Procedure(s) Performed: CATARACT EXTRACTION PHACO AND INTRAOCULAR LENS PLACEMENT (IOC) LEFT (Left Eye)  Patient Location: PACU  Anesthesia Type: MAC  Level of Consciousness: awake, alert  and patient cooperative  Airway and Oxygen Therapy: Patient Spontanous Breathing and Patient connected to supplemental oxygen  Post-op Assessment: Post-op Vital signs reviewed, Patient's Cardiovascular Status Stable, Respiratory Function Stable, Patent Airway and No signs of Nausea or vomiting  Post-op Vital Signs: Reviewed and stable  Complications: No complications documented.

## 2021-01-22 NOTE — Anesthesia Postprocedure Evaluation (Signed)
Anesthesia Post Note  Patient: Erica Little  Procedure(s) Performed: CATARACT EXTRACTION PHACO AND INTRAOCULAR LENS PLACEMENT ([place]) LEFT (Left Eye)     Patient location during evaluation: PACU Anesthesia Type: MAC Level of consciousness: awake and alert and oriented Pain management: satisfactory to patient Vital Signs Assessment: post-procedure vital signs reviewed and stable Respiratory status: spontaneous breathing, nonlabored ventilation and respiratory function stable Cardiovascular status: blood pressure returned to baseline and stable Postop Assessment: Adequate PO intake and No signs of nausea or vomiting Anesthetic complications: no   No complications documented.  Raliegh Ip

## 2021-01-22 NOTE — H&P (Signed)
[facility]   Primary Care Physician:  Dale [place], MD Ophthalmologist: Dr. Willey Blade  Pre-Procedure History & Physical: HPI:  Erica Little is a 75 y.o. female here for cataract surgery.   Past Medical History:  Diagnosis Date  . Family history of adverse reaction to anesthesia    Mother - PONV  . Family history of brain aneurysm   . GERD (gastroesophageal reflux disease)   . Hyperlipidemia   . Hypertension   . MRSA (methicillin resistant Staphylococcus aureus)    skin infections  . Rheumatoid arthritis(714.0)    transaminitis on MTX, remicade rxn, s/p sulfasalazine, orencia  . Ulcer disease     Past Surgical History:  Procedure Laterality Date  . CHOLECYSTECTOMY  2010  . INTRACRANIAL ANEURYSM REPAIR  05/29/2016  . MCP replacements  2001   left  . TUBAL LIGATION      Prior to Admission medications   Medication Sig Start Date End Date Taking? Authorizing Provider  abatacept (ORENCIA) 250 MG injection Once every month   Yes [provider]  albuterol (PROVENTIL HFA;VENTOLIN HFA) 108 (90 BASE) MCG/ACT inhaler Inhale 2 puffs into the lungs every 6 (six) hours as needed for wheezing or shortness of breath. 11/18/15  Yes Paduchowski, Caryn Bee, MD  amLODipine (NORVASC) 2.5 MG tablet TAKE 1 TABLET BY MOUTH EVERY DAY 01/10/21  Yes Dale [place], MD  aspirin 81 MG chewable tablet Chew 81 mg by mouth daily.   Yes [provider]  calcium gluconate 650 MG tablet Take 650 mg by mouth daily.   Yes [provider]  cholecalciferol (VITAMIN D) 1000 UNITS tablet Take 1,000 Units by mouth daily.   Yes [provider]  ELDERBERRY PO Take by mouth daily.   Yes [provider]  lovastatin (MEVACOR) 10 MG tablet TAKE 1 TABLET BY MOUTH EVERYDAY AT BEDTIME 09/27/20  Yes Dale [place], MD  magnesium oxide (MAG-OX) 400 MG tablet Take 400 mg by mouth daily.   Yes [provider]  methotrexate 50 MG/2ML injection INJECT 0.2MLS  SUBCUTANEOUSLY WEEKLY DISPENSE 2 VIALS 04/01/18  Yes [provider]  pantoprazole (PROTONIX) 40 MG tablet TAKE 1 TABLET BY MOUTH EVERY DAY 05/18/20  Yes Dale [place], MD  telmisartan (MICARDIS) 40 MG tablet TAKE 1 TABLET BY MOUTH EVERY DAY 09/27/20  Yes Dale , MD  Triamcinolone Acetonide (NASACORT AQ NA) Place into the nose as needed.   Yes [provider]  valACYclovir (VALTREX) 1000 MG tablet Take 1 tablet (1,000 mg total) by mouth daily. 11/15/20  Yes Dale , MD    Allergies as of 10/16/2020 - Review Complete 07/11/2020  Allergen Reaction Noted  . Oxycodone Nausea Only   . Remicade [infliximab] Other (See Comments) 10/09/2012  . Simvastatin  12/17/2012  . Sulfa antibiotics  10/11/2012  . Zetia [ezetimibe] Other (See Comments) 12/17/2012  . Latex Rash 06/14/2016  . Tape Rash     Family History  Problem Relation Age of Onset  . Heart disease Mother   . Diabetes Mother   . Hypercholesterolemia Mother   . Heart disease Father   . Heart disease Brother        MI age 44  . Hypercholesterolemia Sister   . Cancer Sister   . Colon polyps Sister   . Arthritis/Rheumatoid Paternal Aunt   . Arthritis/Rheumatoid Paternal Uncle   . Breast cancer Neg Hx     Social History   Socioeconomic History  . Marital status: Widowed    Spouse name: Not  on file  . Number of children: 1  . Years of education: Not on file  . Highest education level: Not on file  Occupational History  . Not on file  Tobacco Use  . Smoking status: Former Smoker    Quit date: 12/30/1988    Years since quitting: 32.0  . Smokeless tobacco: Never Used  Vaping Use  . Vaping Use: Never used  Substance and Sexual Activity  . Alcohol use: No    Alcohol/week: 0.0 standard drinks  . Drug use: No  . Sexual activity: Yes  Other Topics Concern  . Not on file  Social History Narrative   Enjoys dancing    Social Determinants of Health   Financial Resource Strain: Low Risk   .  Difficulty of Paying Living Expenses: Not hard at all  Food Insecurity: No Food Insecurity  . Worried About Programme researcher, broadcasting/film/video in the Last Year: Never true  . Ran Out of Food in the Last Year: Never true  Transportation Needs: No Transportation Needs  . Lack of Transportation (Medical): No  . Lack of Transportation (Non-Medical): No  Physical Activity: Sufficiently Active  . Days of Exercise per Week: 5 days  . Minutes of Exercise per Session: 30 min  Stress: No Stress Concern Present  . Feeling of Stress : Not at all  Social Connections: Unknown  . Frequency of Communication with Friends and Family: More than three times a week  . Frequency of Social Gatherings with Friends and Family: More than three times a week  . Attends Religious Services: Not on file  . Active Member of Clubs or Organizations: Not on file  . Attends Banker Meetings: Not on file  . Marital Status: Widowed  Intimate Partner Violence: Not At Risk  . Fear of Current or Ex-Partner: No  . Emotionally Abused: No  . Physically Abused: No  . Sexually Abused: No    Review of Systems: See HPI, otherwise negative ROS  Physical Exam: BP 134/63   Pulse 64   Temp 97.6 F (36.4 C) (Temporal)   Resp 18   Ht 4' 11.02" (1.499 m)   Wt 46.7 kg   SpO2 100%   BMI 20.79 kg/m  General:   Alert,  pleasant and cooperative in NAD Head:  Normocephalic and atraumatic. Respiratory:  Normal work of breathing.  Impression/Plan: Erica Little is here for cataract surgery.  Risks, benefits, limitations, and alternatives regarding cataract surgery have been reviewed with the patient.  Questions have been answered.  All parties agreeable.   Willey Blade, MD  01/22/2021, 12:32 PM

## 2021-01-22 NOTE — Op Note (Signed)
OPERATIVE NOTE  Erica Little [id number] 01/22/2021   PREOPERATIVE DIAGNOSIS:  Nuclear sclerotic cataract left eye.  H25.12   POSTOPERATIVE DIAGNOSIS:    Nuclear sclerotic cataract left eye.     PROCEDURE:  Phacoemusification with posterior chamber intraocular lens placement of the left eye   LENS:   Implant Name Type Inv. Item Serial No. Manufacturer Lot No. LRB No. Used Action  LENS IOL TECNIS EYHANCE 17.0 - [id number] Intraocular Lens LENS IOL TECNIS EYHANCE 17.0 [id number] JOHNSON   Left 1 Implanted      Procedure(s) with comments: CATARACT EXTRACTION PHACO AND INTRAOCULAR LENS PLACEMENT (IOC) LEFT (Left) - 5.90 0:37.7  DIB00 +17.0   ULTRASOUND TIME: 0 minutes 37 seconds.  CDE 5.90   SURGEON:  Willey Blade, MD, MPH   ANESTHESIA:  Topical with tetracaine drops augmented with 1% preservative-free intracameral lidocaine.  ESTIMATED BLOOD LOSS: <1 mL   COMPLICATIONS:  None.   DESCRIPTION OF PROCEDURE:  The patient was identified in the holding room and transported to the operating room and placed in the supine position under the operating microscope.  The left eye was identified as the operative eye and it was prepped and draped in the usual sterile ophthalmic fashion.   A 1.0 millimeter clear-corneal paracentesis was made at the 5:00 position. 0.5 ml of preservative-free 1% lidocaine with epinephrine was injected into the anterior chamber.  The anterior chamber was filled with Healon 5 viscoelastic.  A 2.4 millimeter keratome was used to make a near-clear corneal incision at the 2:00 position.  A curvilinear capsulorrhexis was made with a cystotome and capsulorrhexis forceps.  Balanced salt solution was used to hydrodissect and hydrodelineate the nucleus.   Phacoemulsification was then used in stop and chop fashion to remove the lens nucleus and epinucleus.  The remaining cortex was then removed using the irrigation and aspiration handpiece. Healon was then placed into the  capsular bag to distend it for lens placement.  A lens was then injected into the capsular bag.  The remaining viscoelastic was aspirated.   Wounds were hydrated with balanced salt solution.  The anterior chamber was inflated to a physiologic pressure with balanced salt solution.  Intracameral vigamox 0.1 mL undiltued was injected into the eye and a drop placed onto the ocular surface.  No wound leaks were noted.  The patient was taken to the recovery room in stable condition without complications of anesthesia or surgery  Willey Blade 01/22/2021, 1:02 PM

## 2021-01-23 ENCOUNTER — Encounter: Payer: Self-pay | Admitting: Ophthalmology

## 2021-01-24 ENCOUNTER — Encounter: Payer: Self-pay | Admitting: Ophthalmology

## 2021-02-01 ENCOUNTER — Other Ambulatory Visit
Admission: RE | Admit: 2021-02-01 | Discharge: 2021-02-01 | Disposition: A | Discharge status: Home / Self Care | Payer: Medicare HMO | Source: Ambulatory Visit | Attending: Ophthalmology | Admitting: Ophthalmology

## 2021-02-01 ENCOUNTER — Other Ambulatory Visit: Payer: Self-pay

## 2021-02-01 DIAGNOSIS — Z01812 Encounter for preprocedural laboratory examination: Secondary | ICD-10-CM | POA: Diagnosis present

## 2021-02-01 DIAGNOSIS — Z20822 Contact with and (suspected) exposure to covid-19: Secondary | ICD-10-CM | POA: Insufficient documentation

## 2021-02-01 LAB — SARS CORONAVIRUS 2 (TAT 6-24 HRS): SARS Coronavirus 2: NEGATIVE

## 2021-02-01 NOTE — Discharge Instructions (Signed)

## 2021-02-05 ENCOUNTER — Ambulatory Visit: Payer: Medicare HMO | Admitting: Anesthesiology

## 2021-02-05 ENCOUNTER — Other Ambulatory Visit: Payer: Self-pay

## 2021-02-05 ENCOUNTER — Encounter
Admission: RE | Disposition: A | Discharge status: Home / Self Care | Payer: Self-pay | Source: Home / Self Care | Attending: Ophthalmology

## 2021-02-05 ENCOUNTER — Ambulatory Visit
Admission: RE | Admit: 2021-02-05 | Discharge: 2021-02-05 | Disposition: A | Discharge status: Home / Self Care | Payer: Medicare HMO | Attending: Ophthalmology | Admitting: Ophthalmology

## 2021-02-05 DIAGNOSIS — Z888 Allergy status to other drugs, medicaments and biological substances status: Secondary | ICD-10-CM | POA: Insufficient documentation

## 2021-02-05 DIAGNOSIS — M069 Rheumatoid arthritis, unspecified: Secondary | ICD-10-CM | POA: Diagnosis not present

## 2021-02-05 DIAGNOSIS — Z91040 Latex allergy status: Secondary | ICD-10-CM | POA: Insufficient documentation

## 2021-02-05 DIAGNOSIS — Z87891 Personal history of nicotine dependence: Secondary | ICD-10-CM | POA: Insufficient documentation

## 2021-02-05 DIAGNOSIS — Z91048 Other nonmedicinal substance allergy status: Secondary | ICD-10-CM | POA: Insufficient documentation

## 2021-02-05 DIAGNOSIS — H2511 Age-related nuclear cataract, right eye: Secondary | ICD-10-CM | POA: Insufficient documentation

## 2021-02-05 DIAGNOSIS — Z7982 Long term (current) use of aspirin: Secondary | ICD-10-CM | POA: Insufficient documentation

## 2021-02-05 DIAGNOSIS — Z885 Allergy status to narcotic agent status: Secondary | ICD-10-CM | POA: Diagnosis not present

## 2021-02-05 DIAGNOSIS — Z9842 Cataract extraction status, left eye: Secondary | ICD-10-CM | POA: Diagnosis not present

## 2021-02-05 DIAGNOSIS — Z79899 Other long term (current) drug therapy: Secondary | ICD-10-CM | POA: Diagnosis not present

## 2021-02-05 DIAGNOSIS — Z833 Family history of diabetes mellitus: Secondary | ICD-10-CM | POA: Diagnosis not present

## 2021-02-05 DIAGNOSIS — Z882 Allergy status to sulfonamides status: Secondary | ICD-10-CM | POA: Insufficient documentation

## 2021-02-05 DIAGNOSIS — Z8249 Family history of ischemic heart disease and other diseases of the circulatory system: Secondary | ICD-10-CM | POA: Insufficient documentation

## 2021-02-05 HISTORY — PX: CATARACT EXTRACTION W/PHACO: SHX586

## 2021-02-05 SURGERY — PHACOEMULSIFICATION, CATARACT, WITH IOL INSERTION
Anesthesia: Monitor Anesthesia Care | Site: Eye | Laterality: Right

## 2021-02-05 MED ORDER — SODIUM HYALURONATE 10 MG/ML IO SOLN
INTRAOCULAR | Status: DC | PRN
  Administered 2021-02-05: 0.55 mL via INTRAOCULAR

## 2021-02-05 MED ORDER — MIDAZOLAM HCL 2 MG/2ML IJ SOLN
INTRAMUSCULAR | Status: DC | PRN
  Administered 2021-02-05: 2 mg via INTRAVENOUS

## 2021-02-05 MED ORDER — ACETAMINOPHEN 160 MG/5ML PO SOLN: 325.0000 mg | Freq: Once | ORAL | Status: DC

## 2021-02-05 MED ORDER — SODIUM HYALURONATE 23 MG/ML IO SOLN
INTRAOCULAR | Status: DC | PRN
  Administered 2021-02-05: 0.6 mL via INTRAOCULAR

## 2021-02-05 MED ORDER — MOXIFLOXACIN HCL 0.5 % OP SOLN
OPHTHALMIC | Status: DC | PRN
  Administered 2021-02-05: 0.2 mL via OPHTHALMIC

## 2021-02-05 MED ORDER — EPINEPHRINE PF 1 MG/ML IJ SOLN
INTRAOCULAR | Status: DC | PRN
  Administered 2021-02-05: 74 mL via OPHTHALMIC

## 2021-02-05 MED ORDER — TETRACAINE HCL 0.5 % OP SOLN
1.0000 [drp] | OPHTHALMIC | Status: DC | PRN
  Administered 2021-02-05 (×3): 1 [drp] via OPHTHALMIC

## 2021-02-05 MED ORDER — FENTANYL CITRATE (PF) 100 MCG/2ML IJ SOLN
INTRAMUSCULAR | Status: DC | PRN
  Administered 2021-02-05: 50 ug via INTRAVENOUS

## 2021-02-05 MED ORDER — ARMC OPHTHALMIC DILATING DROPS
1.0000 "application " | OPHTHALMIC | Status: DC | PRN
  Administered 2021-02-05 (×3): 1 via OPHTHALMIC

## 2021-02-05 MED ORDER — ACETAMINOPHEN 325 MG PO TABS: 325.0000 mg | ORAL_TABLET | Freq: Once | ORAL | Status: DC

## 2021-02-05 MED ORDER — LIDOCAINE HCL (PF) 2 % IJ SOLN
INTRAOCULAR | Status: DC | PRN
  Administered 2021-02-05: 1 mL via INTRAOCULAR

## 2021-02-05 SURGICAL SUPPLY — 19 items
CANNULA ANT/CHMB 27G (MISCELLANEOUS) ×2 IMPLANT
CANNULA ANT/CHMB 27GA (MISCELLANEOUS) ×4 IMPLANT
DISSECTOR HYDRO NUCLEUS 50X22 (MISCELLANEOUS) ×2 IMPLANT
GLOVE SURG LX 7.5 STRW (GLOVE) ×1
GLOVE SURG LX STRL 7.5 STRW (GLOVE) ×1 IMPLANT
GLOVE SURG SYN 8.5  E (GLOVE) ×1
GLOVE SURG SYN 8.5 E (GLOVE) ×1 IMPLANT
GLOVE SURG SYN 8.5 PF PI (GLOVE) ×1 IMPLANT
GOWN STRL REUS W/ TWL LRG LVL3 (GOWN DISPOSABLE) ×2 IMPLANT
GOWN STRL REUS W/TWL LRG LVL3 (GOWN DISPOSABLE) ×4
LENS IOL TECNIS EYHANCE 17.5 (Intraocular Lens) ×1 IMPLANT
MARKER SKIN DUAL TIP RULER LAB (MISCELLANEOUS) ×2 IMPLANT
PACK DR. KING ARMS (PACKS) ×2 IMPLANT
PACK EYE AFTER SURG (MISCELLANEOUS) ×2 IMPLANT
PACK OPTHALMIC (MISCELLANEOUS) ×2 IMPLANT
SYR 3ML LL SCALE MARK (SYRINGE) ×2 IMPLANT
SYR TB 1ML LUER SLIP (SYRINGE) ×2 IMPLANT
WATER STERILE IRR 250ML POUR (IV SOLUTION) ×2 IMPLANT
WIPE NON LINTING 3.25X3.25 (MISCELLANEOUS) ×2 IMPLANT

## 2021-02-05 NOTE — Progress Notes (Signed)
No risk

## 2021-02-05 NOTE — Anesthesia Procedure Notes (Signed)
Procedure Name: MAC Date/Time: 02/05/2021 1:25 PM Performed by: Silvana Newness, CRNA Pre-anesthesia Checklist: Patient identified, Emergency Drugs available, Suction available, Patient being monitored and Timeout performed Patient Re-evaluated:Patient Re-evaluated prior to induction Oxygen Delivery Method: Nasal cannula Placement Confirmation: positive ETCO2

## 2021-02-05 NOTE — Anesthesia Postprocedure Evaluation (Signed)
Anesthesia Post Note  Patient: Erica Little  Procedure(s) Performed: CATARACT EXTRACTION PHACO AND INTRAOCULAR LENS PLACEMENT (I[facility]) RIGHT 4.33 00:34.6 (Right Eye)     Patient location during evaluation: PACU Anesthesia Type: MAC Level of consciousness: awake and alert and oriented Pain management: satisfactory to patient Vital Signs Assessment: post-procedure vital signs reviewed and stable Respiratory status: spontaneous breathing, nonlabored ventilation and respiratory function stable Cardiovascular status: blood pressure returned to baseline and stable Postop Assessment: Adequate PO intake and No signs of nausea or vomiting Anesthetic complications: no   No complications documented.  Raliegh Ip

## 2021-02-05 NOTE — H&P (Signed)
[facility]   Primary Care Physician:  Dale [place], MD Ophthalmologist: Dr. Willey Blade  Pre-Procedure History & Physical: HPI:  Erica Little is a 75 y.o. female here for cataract surgery.   Past Medical History:  Diagnosis Date  . Family history of adverse reaction to anesthesia    Mother - PONV  . Family history of brain aneurysm   . GERD (gastroesophageal reflux disease)   . Hyperlipidemia   . Hypertension   . MRSA (methicillin resistant Staphylococcus aureus)    skin infections  . Rheumatoid arthritis(714.0)    transaminitis on MTX, remicade rxn, s/p sulfasalazine, orencia  . Ulcer disease     Past Surgical History:  Procedure Laterality Date  . CATARACT EXTRACTION W/PHACO Left 01/22/2021   Procedure: CATARACT EXTRACTION PHACO AND INTRAOCULAR LENS PLACEMENT (I[facility]) LEFT;  Surgeon: Nevada Crane, MD;  Location: [facility];  Service: Ophthalmology;  Laterality: Left;  5.90 0:37.7  . CHOLECYSTECTOMY  2010  . INTRACRANIAL ANEURYSM REPAIR  05/29/2016  . MCP replacements  2001   left  . TUBAL LIGATION      Prior to Admission medications   Medication Sig Start Date End Date Taking? Authorizing Provider  amLODipine (NORVASC) 2.5 MG tablet TAKE 1 TABLET BY MOUTH EVERY DAY 01/10/21  Yes Dale [place], MD  aspirin 81 MG chewable tablet Chew 81 mg by mouth daily.   Yes [provider]  calcium gluconate 650 MG tablet Take 650 mg by mouth daily.   Yes [provider]  cholecalciferol (VITAMIN D) 1000 UNITS tablet Take 1,000 Units by mouth daily.   Yes [provider]  ELDERBERRY PO Take by mouth daily.   Yes [provider]  lovastatin (MEVACOR) 10 MG tablet TAKE 1 TABLET BY MOUTH EVERYDAY AT BEDTIME 09/27/20  Yes Dale [place], MD  magnesium oxide (MAG-OX) 400 MG tablet Take 400 mg by mouth daily.   Yes [provider]  methotrexate 50 MG/2ML injection INJECT 0.2MLS SUBCUTANEOUSLY WEEKLY DISPENSE 2 VIALS  04/01/18  Yes [provider]  pantoprazole (PROTONIX) 40 MG tablet TAKE 1 TABLET BY MOUTH EVERY DAY 05/18/20  Yes Dale [place], MD  telmisartan (MICARDIS) 40 MG tablet TAKE 1 TABLET BY MOUTH EVERY DAY 09/27/20  Yes Dale [place], MD  abatacept (ORENCIA) 250 MG injection Once every month    [provider]  albuterol (PROVENTIL HFA;VENTOLIN HFA) 108 (90 BASE) MCG/ACT inhaler Inhale 2 puffs into the lungs every 6 (six) hours as needed for wheezing or shortness of breath. 11/18/15   Minna Antis, MD  Triamcinolone Acetonide (NASACORT AQ NA) Place into the nose as needed.    [provider]  valACYclovir (VALTREX) 1000 MG tablet Take 1 tablet (1,000 mg total) by mouth daily. 11/15/20   Dale [place], MD    Allergies as of 01/24/2021 - Review Complete 01/24/2021  Allergen Reaction Noted  . Oxycodone Nausea Only   . Remicade [infliximab] Other (See Comments) 10/09/2012  . Simvastatin  12/17/2012  . Sulfa antibiotics  10/11/2012  . Zetia [ezetimibe] Other (See Comments) 12/17/2012  . Latex Rash 06/14/2016  . Tape Rash     Family History  Problem Relation Age of Onset  . Heart disease Mother   . Diabetes Mother   . Hypercholesterolemia Mother   . Heart disease Father   . Heart disease Brother        MI age 74  . Hypercholesterolemia Sister   . Cancer Sister   . Colon polyps Sister   .  Arthritis/Rheumatoid Paternal Aunt   . Arthritis/Rheumatoid Paternal Uncle   . Breast cancer Neg Hx     Social History   Socioeconomic History  . Marital status: Widowed    Spouse name: Not on file  . Number of children: 1  . Years of education: Not on file  . Highest education level: Not on file  Occupational History  . Not on file  Tobacco Use  . Smoking status: Former Smoker    Quit date: 12/30/1988    Years since quitting: 32.1  . Smokeless tobacco: Never Used  Vaping Use  . Vaping Use: Never used  Substance and Sexual Activity  . Alcohol use: No     Alcohol/week: 0.0 standard drinks  . Drug use: No  . Sexual activity: Yes  Other Topics Concern  . Not on file  Social History Narrative   Enjoys dancing    Social Determinants of Health   Financial Resource Strain: Low Risk   . Difficulty of Paying Living Expenses: Not hard at all  Food Insecurity: No Food Insecurity  . Worried About Programme researcher, broadcasting/film/video in the Last Year: Never true  . Ran Out of Food in the Last Year: Never true  Transportation Needs: No Transportation Needs  . Lack of Transportation (Medical): No  . Lack of Transportation (Non-Medical): No  Physical Activity: Sufficiently Active  . Days of Exercise per Week: 5 days  . Minutes of Exercise per Session: 30 min  Stress: No Stress Concern Present  . Feeling of Stress : Not at all  Social Connections: Unknown  . Frequency of Communication with Friends and Family: More than three times a week  . Frequency of Social Gatherings with Friends and Family: More than three times a week  . Attends Religious Services: Not on file  . Active Member of Clubs or Organizations: Not on file  . Attends Banker Meetings: Not on file  . Marital Status: Widowed  Intimate Partner Violence: Not At Risk  . Fear of Current or Ex-Partner: No  . Emotionally Abused: No  . Physically Abused: No  . Sexually Abused: No    Review of Systems: See HPI, otherwise negative ROS  Physical Exam: BP (!) 172/66   Pulse 69   Temp 97.8 F (36.6 C)   Resp 20   Ht 4\' 11"  (1.499 m)   Wt 46.3 kg   SpO2 100%   BMI 20.60 kg/m  General:   Alert,  pleasant and cooperative in NAD Head:  Normocephalic and atraumatic. Respiratory:  Normal work of breathing.  Impression/Plan: Erica Little is here for cataract surgery.  Risks, benefits, limitations, and alternatives regarding cataract surgery have been reviewed with the patient.  Questions have been answered.  All parties agreeable.   Charlann Noss, MD  02/05/2021, 1:09 PM

## 2021-02-05 NOTE — Op Note (Signed)
OPERATIVE NOTE  Kaisa Wofford [id number] 02/05/2021   PREOPERATIVE DIAGNOSIS:  Nuclear sclerotic cataract right eye.  H25.11   POSTOPERATIVE DIAGNOSIS:    Nuclear sclerotic cataract right eye.     PROCEDURE:  Phacoemusification with posterior chamber intraocular lens placement of the right eye   LENS:   Implant Name Type Inv. Item Serial No. Manufacturer Lot No. LRB No. Used Action  LENS IOL TECNIS EYHANCE 17.5 - [id number] Intraocular Lens LENS IOL TECNIS EYHANCE 17.5 [id number] JOHNSON   Right 1 Implanted       Procedure(s): CATARACT EXTRACTION PHACO AND INTRAOCULAR LENS PLACEMENT (IOC) RIGHT 4.33 00:34.6 (Right)  DIB00 +17.5   ULTRASOUND TIME: 0 minutes 34 seconds.  CDE 4.33   SURGEON:  Willey Blade, MD, MPH  ANESTHESIOLOGIST: Anesthesiologist: Ranee Gosselin, MD CRNA: Michaele Offer, CRNA   ANESTHESIA:  Topical with tetracaine drops augmented with 1% preservative-free intracameral lidocaine.  ESTIMATED BLOOD LOSS: less than 1 mL.   COMPLICATIONS:  None.   DESCRIPTION OF PROCEDURE:  The patient was identified in the holding room and transported to the operating room and placed in the supine position under the operating microscope.  The right eye was identified as the operative eye and it was prepped and draped in the usual sterile ophthalmic fashion.   A 1.0 millimeter clear-corneal paracentesis was made at the 10:30 position. 0.5 ml of preservative-free 1% lidocaine with epinephrine was injected into the anterior chamber.  The anterior chamber was filled with Healon 5 viscoelastic.  A 2.4 millimeter keratome was used to make a near-clear corneal incision at the 8:00 position.  A curvilinear capsulorrhexis was made with a cystotome and capsulorrhexis forceps.  Balanced salt solution was used to hydrodissect and hydrodelineate the nucleus.   Phacoemulsification was then used in stop and chop fashion to remove the lens nucleus and epinucleus.  The remaining cortex was then  removed using the irrigation and aspiration handpiece. Healon was then placed into the capsular bag to distend it for lens placement.  A lens was then injected into the capsular bag.  The remaining viscoelastic was aspirated.   Wounds were hydrated with balanced salt solution.  The anterior chamber was inflated to a physiologic pressure with balanced salt solution.   Intracameral vigamox 0.1 mL undiluted was injected into the eye and a drop placed onto the ocular surface.  No wound leaks were noted.  The patient was taken to the recovery room in stable condition without complications of anesthesia or surgery  Willey Blade 02/05/2021, 1:43 PM

## 2021-02-05 NOTE — Anesthesia Preprocedure Evaluation (Addendum)
Anesthesia Evaluation  Patient identified by MRN, date of birth, ID band Patient awake    Reviewed: Allergy & Precautions, H&P , NPO status , Patient's Chart, lab work & pertinent test results  Airway Mallampati: II  TM Distance: >3 FB Neck ROM: full    Dental no notable dental hx.    Pulmonary COPD, former smoker,    Pulmonary exam normal breath sounds clear to auscultation       Cardiovascular hypertension, Normal cardiovascular exam Rhythm:regular Rate:Normal     Neuro/Psych    GI/Hepatic GERD  ,  Endo/Other    Renal/GU      Musculoskeletal   Abdominal   Peds  Hematology   Anesthesia Other Findings   Reproductive/Obstetrics                            Anesthesia Physical Anesthesia Plan  ASA: III  Anesthesia Plan: MAC   Post-op Pain Management:    Induction:   PONV Risk Score and Plan: 2 and Treatment may vary due to age or medical condition, TIVA and Midazolam  Airway Management Planned:   Additional Equipment:   Intra-op Plan:   Post-operative Plan:   Informed Consent: I have reviewed the patients History and Physical, chart, labs and discussed the procedure including the risks, benefits and alternatives for the proposed anesthesia with the patient or authorized representative who has indicated his/her understanding and acceptance.     Dental Advisory Given  Plan Discussed with: CRNA  Anesthesia Plan Comments:         Anesthesia Quick Evaluation                                  Anesthesia Evaluation  Patient identified by MRN, date of birth, ID band Patient awake    Reviewed: Allergy & Precautions, H&P , NPO status , Patient's Chart, lab work & pertinent test results  Airway Mallampati: II  TM Distance: >3 FB Neck ROM: full    Dental no notable dental hx.    Pulmonary COPD, former smoker,    Pulmonary exam normal breath sounds clear to  auscultation       Cardiovascular hypertension, Normal cardiovascular exam Rhythm:regular Rate:Normal     Neuro/Psych    GI/Hepatic GERD  ,  Endo/Other    Renal/GU      Musculoskeletal RA   Abdominal   Peds  Hematology   Anesthesia Other Findings   Reproductive/Obstetrics                             Anesthesia Physical Anesthesia Plan  ASA: III  Anesthesia Plan: MAC   Post-op Pain Management:    Induction:   PONV Risk Score and Plan: 2 and Treatment may vary due to age or medical condition, TIVA and Midazolam  Airway Management Planned:   Additional Equipment:   Intra-op Plan:   Post-operative Plan:   Informed Consent: I have reviewed the patients History and Physical, chart, labs and discussed the procedure including the risks, benefits and alternatives for the proposed anesthesia with the patient or authorized representative who has indicated his/her understanding and acceptance.     Dental Advisory Given  Plan Discussed with: CRNA  Anesthesia Plan Comments:         Anesthesia Quick Evaluation  Anesthesia Evaluation  Patient identified by MRN, date of birth, ID band Patient awake    Reviewed: Allergy & Precautions, H&P , NPO status , Patient's Chart, lab work & pertinent test results  Airway Mallampati: II  TM Distance: >3 FB Neck ROM: full    Dental no notable dental hx.    Pulmonary COPD, former smoker,    Pulmonary exam normal breath sounds clear to auscultation       Cardiovascular hypertension, Normal cardiovascular exam Rhythm:regular Rate:Normal     Neuro/Psych    GI/Hepatic GERD  ,  Endo/Other    Renal/GU      Musculoskeletal RA   Abdominal   Peds  Hematology   Anesthesia Other Findings   Reproductive/Obstetrics                             Anesthesia Physical Anesthesia Plan  ASA: III  Anesthesia  Plan: MAC   Post-op Pain Management:    Induction:   PONV Risk Score and Plan: 2 and Treatment may vary due to age or medical condition, TIVA and Midazolam  Airway Management Planned:   Additional Equipment:   Intra-op Plan:   Post-operative Plan:   Informed Consent: I have reviewed the patients History and Physical, chart, labs and discussed the procedure including the risks, benefits and alternatives for the proposed anesthesia with the patient or authorized representative who has indicated his/her understanding and acceptance.     Dental Advisory Given  Plan Discussed with: CRNA  Anesthesia Plan Comments:         Anesthesia Quick Evaluation

## 2021-02-05 NOTE — Transfer of Care (Signed)
Immediate Anesthesia Transfer of Care Note  Patient: Erica Little  Procedure(s) Performed: CATARACT EXTRACTION PHACO AND INTRAOCULAR LENS PLACEMENT (I[facility]) RIGHT 4.33 00:34.6 (Right Eye)  Patient Location: PACU  Anesthesia Type: MAC  Level of Consciousness: awake, alert  and patient cooperative  Airway and Oxygen Therapy: Patient Spontanous Breathing and Patient connected to supplemental oxygen  Post-op Assessment: Post-op Vital signs reviewed, Patient's Cardiovascular Status Stable, Respiratory Function Stable, Patent Airway and No signs of Nausea or vomiting  Post-op Vital Signs: Reviewed and stable  Complications: No complications documented.

## 2021-02-06 ENCOUNTER — Encounter: Payer: Self-pay | Admitting: Ophthalmology

## 2021-02-08 ENCOUNTER — Other Ambulatory Visit: Payer: Self-pay | Admitting: Internal Medicine

## 2021-03-05 ENCOUNTER — Other Ambulatory Visit: Payer: Self-pay | Admitting: Internal Medicine

## 2021-03-15 ENCOUNTER — Other Ambulatory Visit: Payer: Self-pay

## 2021-03-15 ENCOUNTER — Ambulatory Visit (INDEPENDENT_AMBULATORY_CARE_PROVIDER_SITE_OTHER): Payer: Medicare HMO | Admitting: Internal Medicine

## 2021-03-15 VITALS — BP 118/70 | HR 76 | Temp 98.1°F | Resp 16 | Ht 59.0 in | Wt 100.6 lb

## 2021-03-15 DIAGNOSIS — J449 Chronic obstructive pulmonary disease, unspecified: Secondary | ICD-10-CM

## 2021-03-15 DIAGNOSIS — R918 Other nonspecific abnormal finding of lung field: Secondary | ICD-10-CM

## 2021-03-15 DIAGNOSIS — M069 Rheumatoid arthritis, unspecified: Secondary | ICD-10-CM

## 2021-03-15 DIAGNOSIS — Z0000 Encounter for general adult medical examination without abnormal findings: Secondary | ICD-10-CM

## 2021-03-15 DIAGNOSIS — R739 Hyperglycemia, unspecified: Secondary | ICD-10-CM

## 2021-03-15 DIAGNOSIS — I700 Atherosclerosis of aorta: Secondary | ICD-10-CM

## 2021-03-15 DIAGNOSIS — F439 Reaction to severe stress, unspecified: Secondary | ICD-10-CM

## 2021-03-15 DIAGNOSIS — N950 Postmenopausal bleeding: Secondary | ICD-10-CM

## 2021-03-15 DIAGNOSIS — R634 Abnormal weight loss: Secondary | ICD-10-CM

## 2021-03-15 DIAGNOSIS — E7800 Pure hypercholesterolemia, unspecified: Secondary | ICD-10-CM

## 2021-03-15 DIAGNOSIS — I10 Essential (primary) hypertension: Secondary | ICD-10-CM

## 2021-03-15 NOTE — Progress Notes (Signed)
Patient ID: Erica Little, female   DOB: [birth year], 75 y.o.   MRN: [id number]   Subjective:    Patient ID: Erica Little, female    DOB: [birth year], 75 y.o.   MRN: [id number]  HPI This visit occurred during the SARS-CoV-2 public health emergency.  Safety protocols were in place, including screening questions prior to the visit, additional usage of staff PPE, and extensive cleaning of exam room while observing appropriate contact time as indicated for disinfecting solutions.  Patient here for physical exam.  She reports she is doing relatively well.  Stays active. No chest pain.  Breathing stable. No increased cough or congestion.  No acid reflux.  No abdominal pain or cramping.  Seeing rheumatology for f/u RA.  Receiving orencia. Joints - stable.  Handling stress.     Past Medical History:  Diagnosis Date  . Family history of adverse reaction to anesthesia    Mother - PONV  . Family history of brain aneurysm   . GERD (gastroesophageal reflux disease)   . Hyperlipidemia   . Hypertension   . MRSA (methicillin resistant Staphylococcus aureus)    skin infections  . Rheumatoid arthritis(714.0)    transaminitis on MTX, remicade rxn, s/p sulfasalazine, orencia  . Ulcer disease    Past Surgical History:  Procedure Laterality Date  . CATARACT EXTRACTION W/PHACO Left 01/22/2021   Procedure: CATARACT EXTRACTION PHACO AND INTRAOCULAR LENS PLACEMENT ([place]) LEFT;  Surgeon: Eulogio Bear, MD;  Location: [facility];  Service: Ophthalmology;  Laterality: Left;  5.90 0:37.7  . CATARACT EXTRACTION W/PHACO Right 02/05/2021   Procedure: CATARACT EXTRACTION PHACO AND INTRAOCULAR LENS PLACEMENT ([facility]) RIGHT 4.33 00:34.6;  Surgeon: Eulogio Bear, MD;  Location: [facility];  Service: Ophthalmology;  Laterality: Right;  . CHOLECYSTECTOMY  2010  . INTRACRANIAL ANEURYSM REPAIR  05/29/2016  . MCP replacements  2001   left  . TUBAL LIGATION     Family History  Problem  Relation Age of Onset  . Heart disease Mother   . Diabetes Mother   . Hypercholesterolemia Mother   . Heart disease Father   . Heart disease Brother        MI age 22  . Hypercholesterolemia Sister   . Cancer Sister   . Colon polyps Sister   . Arthritis/Rheumatoid Paternal Aunt   . Arthritis/Rheumatoid Paternal Uncle   . Breast cancer Neg Hx    Social History   Socioeconomic History  . Marital status: Widowed    Spouse name: Not on file  . Number of children: 1  . Years of education: Not on file  . Highest education level: Not on file  Occupational History  . Not on file  Tobacco Use  . Smoking status: Former Smoker    Quit date: 12/30/1988    Years since quitting: 32.2  . Smokeless tobacco: Never Used  Vaping Use  . Vaping Use: Never used  Substance and Sexual Activity  . Alcohol use: No    Alcohol/week: 0.0 standard drinks  . Drug use: No  . Sexual activity: Yes  Other Topics Concern  . Not on file  Social History Narrative   Enjoys dancing    Social Determinants of Health   Financial Resource Strain: Low Risk   . Difficulty of Paying Living Expenses: Not hard at all  Food Insecurity: No Food Insecurity  . Worried About Charity fundraiser in the Last Year: Never true  . Ran Out of Food in the Last  Year: Never true  Transportation Needs: No Transportation Needs  . Lack of Transportation (Medical): No  . Lack of Transportation (Non-Medical): No  Physical Activity: Sufficiently Active  . Days of Exercise per Week: 5 days  . Minutes of Exercise per Session: 30 min  Stress: No Stress Concern Present  . Feeling of Stress : Not at all  Social Connections: Unknown  . Frequency of Communication with Friends and Family: More than three times a week  . Frequency of Social Gatherings with Friends and Family: More than three times a week  . Attends Religious Services: Not on file  . Active Member of Clubs or Organizations: Not on file  . Attends Archivist  Meetings: Not on file  . Marital Status: Widowed    Outpatient Encounter Medications as of 03/15/2021  Medication Sig  . abatacept (ORENCIA) 250 MG injection Once every month  . albuterol (PROVENTIL HFA;VENTOLIN HFA) 108 (90 BASE) MCG/ACT inhaler Inhale 2 puffs into the lungs every 6 (six) hours as needed for wheezing or shortness of breath.  Marland Kitchen amLODipine (NORVASC) 2.5 MG tablet TAKE 1 TABLET BY MOUTH EVERY DAY  . aspirin 81 MG chewable tablet Chew 81 mg by mouth daily.  . calcium gluconate 650 MG tablet Take 650 mg by mouth daily.  . cholecalciferol (VITAMIN D) 1000 UNITS tablet Take 1,000 Units by mouth daily.  Marland Kitchen ELDERBERRY PO Take by mouth daily.  Marland Kitchen lovastatin (MEVACOR) 10 MG tablet TAKE 1 TABLET BY MOUTH EVERYDAY AT BEDTIME  . magnesium oxide (MAG-OX) 400 MG tablet Take 400 mg by mouth daily.  . methotrexate 50 MG/2ML injection INJECT 0.2MLS SUBCUTANEOUSLY WEEKLY DISPENSE 2 VIALS  . pantoprazole (PROTONIX) 40 MG tablet TAKE 1 TABLET BY MOUTH EVERY DAY  . telmisartan (MICARDIS) 40 MG tablet TAKE 1 TABLET BY MOUTH EVERY DAY  . Triamcinolone Acetonide (NASACORT AQ NA) Place into the nose as needed.  . valACYclovir (VALTREX) 1000 MG tablet TAKE 1 TABLET (1,000 MG TOTAL) BY MOUTH DAILY.   No facility-administered encounter medications on file as of 03/15/2021.    Review of Systems  Constitutional: Negative for appetite change and unexpected weight change.  HENT: Negative for congestion, sinus pressure and sore throat.   Eyes: Negative for pain and visual disturbance.  Respiratory: Negative for cough, chest tightness and shortness of breath.   Cardiovascular: Negative for chest pain, palpitations and leg swelling.  Gastrointestinal: Negative for abdominal pain, diarrhea, nausea and vomiting.  Genitourinary: Negative for difficulty urinating and frequency.  Musculoskeletal: Negative for myalgias.       Joints - stable.    Skin: Negative for color change and rash.  Neurological: Negative  for dizziness and headaches.  Hematological: Negative for adenopathy. Does not bruise/bleed easily.  Psychiatric/Behavioral: Negative for agitation and dysphoric mood.       Objective:    Physical Exam Vitals reviewed.  Constitutional:      General: She is not in acute distress.    Appearance: Normal appearance. She is well-developed.  HENT:     Head: Normocephalic and atraumatic.     Right Ear: External ear normal.     Left Ear: External ear normal.  Eyes:     General: No scleral icterus.       Right eye: No discharge.        Left eye: No discharge.     Conjunctiva/sclera: Conjunctivae normal.  Neck:     Thyroid: No thyromegaly.  Cardiovascular:     Rate and Rhythm: Normal  rate and regular rhythm.  Pulmonary:     Effort: No tachypnea, accessory muscle usage or respiratory distress.     Breath sounds: Normal breath sounds. No decreased breath sounds or wheezing.  Chest:  Breasts:     Right: No inverted nipple, mass, nipple discharge or tenderness (no axillary adenopathy).     Left: No inverted nipple, mass, nipple discharge or tenderness (no axilarry adenopathy).    Abdominal:     General: Bowel sounds are normal.     Palpations: Abdomen is soft.     Tenderness: There is no abdominal tenderness.  Musculoskeletal:        General: No swelling or tenderness.     Cervical back: Neck supple. No tenderness.  Lymphadenopathy:     Cervical: No cervical adenopathy.  Skin:    Findings: No erythema or rash.  Neurological:     Mental Status: She is alert and oriented to person, place, and time.  Psychiatric:        Mood and Affect: Mood normal.        Behavior: Behavior normal.     BP 118/70   Pulse 76   Temp 98.1 F (36.7 C) (Oral)   Resp 16   Ht 4' 11"  (1.499 m)   Wt 100 lb 9.6 oz (45.6 kg)   SpO2 98%   BMI 20.32 kg/m  Wt Readings from Last 3 Encounters:  03/15/21 100 lb 9.6 oz (45.6 kg)  02/05/21 102 lb (46.3 kg)  01/22/21 103 lb (46.7 kg)     Lab Results   Component Value Date   WBC 5.8 08/27/2018   HGB 13.1 08/27/2018   HCT 39.5 08/27/2018   PLT 206.0 08/27/2018   GLUCOSE 82 01/05/2019   CHOL 186 01/05/2019   TRIG 135.0 01/05/2019   HDL 48.80 01/05/2019   LDLDIRECT 167.8 12/20/2013   LDLCALC 110 (H) 01/05/2019   ALT 11 01/05/2019   AST 17 01/05/2019   NA 134 (L) 01/05/2019   K 4.5 01/05/2019   CL 101 01/05/2019   CREATININE 0.89 01/05/2019   BUN 11 01/05/2019   CO2 27 01/05/2019   TSH 3.19 01/05/2019   INR 1.1 (H) 12/06/2013   HGBA1C 5.7 01/05/2019       Assessment & Plan:   Problem List Items Addressed This Visit    Aortic atherosclerosis ([place])    Continue lovastatin.       COPD with chronic bronchitis and emphysema ([place])    Has seen Dr Patsey Berthold.  Breathing stable.  Follow.       Health care maintenance    Physical today 03/15/21.  Mammogram 07/18/20 - Birads I.  Colonoscopy 08/2014.  Recommended f/u 08/2019.  Will need to confirm f/u colonoscopy.       Hypercholesteremia    On lovastatin.  Follow lipid panel and liver function tests.        Hyperglycemia    Follow met b and a1c.       Hypertension    Continue micardis.  Blood pressure doing well.  Follow pressures.  Follow metabolic panel.       Lung nodules    Followed by pulmonary.  Had f/u CT scan 10/2020.   Stable.  Recommended f/u in 2 years.        Postmenopausal bleeding    Worked up by Dr Leafy Ro.  Ultrasound - no thickened endometrium.  Felt to be related to vaginal atrophy.  Estrogen cream.  Follow.  Rheumatoid arthritis ([place])    On MTX and orencia.  Doing well.  Followed by rheumatology.       Stress    Discussed with her today. She feels she is doing well.  Follow.       Weight loss    States she is eating well.  She watches what she eats. States she feels better when weight is 100lbs.  Follow.        Other Visit Diagnoses    Routine general medical examination at a health care facility    -  Primary       Einar Pheasant,  MD

## 2021-03-24 ENCOUNTER — Encounter: Payer: Self-pay | Admitting: Internal Medicine

## 2021-03-24 ENCOUNTER — Telehealth: Payer: Self-pay | Admitting: Internal Medicine

## 2021-03-24 NOTE — Assessment & Plan Note (Signed)
Worked up by Dr Dalbert Garnet.  Ultrasound - no thickened endometrium.  Felt to be related to vaginal atrophy.  Estrogen cream.  Follow.

## 2021-03-24 NOTE — Assessment & Plan Note (Signed)
Follow met b and a1c.  

## 2021-03-24 NOTE — Telephone Encounter (Signed)
Need to confirm with Erica Little  - we have colonoscopy 08/2014.  Due 9/202.  If has not had, would like to refer her to GI for f/u colonoscopy.  If has had colonoscopy, need copy.

## 2021-03-24 NOTE — Assessment & Plan Note (Signed)
Continue lovastatin 

## 2021-03-24 NOTE — Assessment & Plan Note (Signed)
States she is eating well.  She watches what she eats. States she feels better when weight is 100lbs.  Follow.

## 2021-03-24 NOTE — Assessment & Plan Note (Signed)
Physical today 03/15/21.  Mammogram 07/18/20 - Birads I.  Colonoscopy 08/2014.  Recommended f/u 08/2019.  Will need to confirm f/u colonoscopy.

## 2021-03-24 NOTE — Assessment & Plan Note (Signed)
On lovastatin.  Follow lipid panel and liver function tests.   

## 2021-03-24 NOTE — Assessment & Plan Note (Signed)
Discussed with her today.  She feels she is doing well.  Follow.   

## 2021-03-24 NOTE — Assessment & Plan Note (Signed)
Has seen Dr Gonzalez.  Breathing stable.  Follow.  

## 2021-03-24 NOTE — Assessment & Plan Note (Signed)
Continue micardis.  Blood pressure doing well.  Follow pressures.  Follow metabolic panel.   

## 2021-03-24 NOTE — Assessment & Plan Note (Addendum)
Followed by pulmonary.  Had f/u CT scan 10/2020.   Stable.  Recommended f/u in 2 years.   

## 2021-03-24 NOTE — Assessment & Plan Note (Signed)
On MTX and orencia.  Doing well.  Followed by rheumatology.

## 2021-03-26 NOTE — Telephone Encounter (Signed)
Patient has not had a colonoscopy since 2015 and declined having another one done. Pt says at her age she feels she does not need another.

## 2021-06-20 ENCOUNTER — Other Ambulatory Visit: Payer: Self-pay | Admitting: Internal Medicine

## 2021-06-20 DIAGNOSIS — Z1231 Encounter for screening mammogram for malignant neoplasm of breast: Secondary | ICD-10-CM

## 2021-07-11 ENCOUNTER — Other Ambulatory Visit: Payer: Self-pay | Admitting: Internal Medicine

## 2021-08-14 ENCOUNTER — Ambulatory Visit
Admission: RE | Admit: 2021-08-14 | Discharge: 2021-08-14 | Disposition: A | Discharge status: Home / Self Care | Payer: Medicare HMO | Source: Ambulatory Visit | Attending: Internal Medicine | Admitting: Internal Medicine

## 2021-08-14 ENCOUNTER — Other Ambulatory Visit: Payer: Self-pay

## 2021-08-14 DIAGNOSIS — Z1231 Encounter for screening mammogram for malignant neoplasm of breast: Secondary | ICD-10-CM

## 2021-08-16 ENCOUNTER — Other Ambulatory Visit: Payer: Self-pay | Admitting: Internal Medicine

## 2021-08-16 DIAGNOSIS — R928 Other abnormal and inconclusive findings on diagnostic imaging of breast: Secondary | ICD-10-CM

## 2021-08-16 NOTE — Progress Notes (Signed)
Order placed for left breast mammogram and ultrasound.  

## 2021-08-17 ENCOUNTER — Telehealth: Payer: Self-pay | Admitting: Internal Medicine

## 2021-08-17 NOTE — Progress Notes (Signed)
Left message for patient to return call back for mammogram results.

## 2021-08-17 NOTE — Telephone Encounter (Signed)
PT called to return the call for results. 

## 2021-08-27 ENCOUNTER — Other Ambulatory Visit: Payer: Self-pay | Admitting: Internal Medicine

## 2021-08-29 ENCOUNTER — Ambulatory Visit
Admission: RE | Admit: 2021-08-29 | Discharge: 2021-08-29 | Disposition: A | Discharge status: Home / Self Care | Payer: Medicare HMO | Source: Ambulatory Visit | Attending: Internal Medicine | Admitting: Internal Medicine

## 2021-08-29 ENCOUNTER — Other Ambulatory Visit: Payer: Self-pay

## 2021-08-29 DIAGNOSIS — R928 Other abnormal and inconclusive findings on diagnostic imaging of breast: Secondary | ICD-10-CM

## 2021-09-07 ENCOUNTER — Other Ambulatory Visit: Payer: Self-pay | Admitting: Internal Medicine

## 2021-09-17 ENCOUNTER — Ambulatory Visit: Payer: Medicare HMO | Admitting: Internal Medicine

## 2021-10-11 ENCOUNTER — Ambulatory Visit: Payer: Medicare HMO | Admitting: Internal Medicine

## 2021-10-11 ENCOUNTER — Encounter: Payer: Self-pay | Admitting: Internal Medicine

## 2021-10-11 ENCOUNTER — Other Ambulatory Visit: Payer: Self-pay

## 2021-10-11 ENCOUNTER — Telehealth: Payer: Self-pay | Admitting: Internal Medicine

## 2021-10-11 VITALS — BP 121/68 | HR 69 | Temp 98.2°F | Ht 59.0 in | Wt 99.7 lb

## 2021-10-11 DIAGNOSIS — J449 Chronic obstructive pulmonary disease, unspecified: Secondary | ICD-10-CM

## 2021-10-11 DIAGNOSIS — I700 Atherosclerosis of aorta: Secondary | ICD-10-CM

## 2021-10-11 DIAGNOSIS — R918 Other nonspecific abnormal finding of lung field: Secondary | ICD-10-CM

## 2021-10-11 DIAGNOSIS — E7800 Pure hypercholesterolemia, unspecified: Secondary | ICD-10-CM

## 2021-10-11 DIAGNOSIS — Z9889 Other specified postprocedural states: Secondary | ICD-10-CM

## 2021-10-11 DIAGNOSIS — E871 Hypo-osmolality and hyponatremia: Secondary | ICD-10-CM

## 2021-10-11 DIAGNOSIS — H6120 Impacted cerumen, unspecified ear: Secondary | ICD-10-CM

## 2021-10-11 DIAGNOSIS — R0989 Other specified symptoms and signs involving the circulatory and respiratory systems: Secondary | ICD-10-CM

## 2021-10-11 DIAGNOSIS — M069 Rheumatoid arthritis, unspecified: Secondary | ICD-10-CM | POA: Diagnosis not present

## 2021-10-11 DIAGNOSIS — J4489 Other specified chronic obstructive pulmonary disease: Secondary | ICD-10-CM

## 2021-10-11 DIAGNOSIS — R634 Abnormal weight loss: Secondary | ICD-10-CM

## 2021-10-11 DIAGNOSIS — R739 Hyperglycemia, unspecified: Secondary | ICD-10-CM | POA: Diagnosis not present

## 2021-10-11 DIAGNOSIS — I10 Essential (primary) hypertension: Secondary | ICD-10-CM | POA: Diagnosis not present

## 2021-10-11 DIAGNOSIS — Z8679 Personal history of other diseases of the circulatory system: Secondary | ICD-10-CM

## 2021-10-11 DIAGNOSIS — N950 Postmenopausal bleeding: Secondary | ICD-10-CM

## 2021-10-11 MED ORDER — DEBROX 6.5 % OT SOLN: OTIC | 0 refills | Status: AC

## 2021-10-11 NOTE — Telephone Encounter (Signed)
Patient scheduled for labs in 6-8 weeks as requested by check out note.   Needing orders placed.

## 2021-10-11 NOTE — Progress Notes (Signed)
Patient ID: Deriona Altemose, female   DOB: [birth year], 75 y.o.   MRN: [id number]   Subjective:    Patient ID: [name], female    DOB: 1946-06-24, 75 y.o.   MRN: [id number]  This visit occurred during the SARS-CoV-2 public health emergency.  Safety protocols were in place, including screening questions prior to the visit, additional usage of staff PPE, and extensive cleaning of exam room while observing appropriate contact time as indicated for disinfecting solutions.   Patient here for a scheduled follow up.   HPI Here to follow up regarding blood pressure and cholesterol.  Has RA.  Receiving orencia infusion.  Doing well.  Staying active.  No chest pain or sob reported.  No abdominal pain.  Bowels moving.  Reports appetite is good.  Eating well.    Past Medical History:  Diagnosis Date   Family history of adverse reaction to anesthesia    Mother - PONV   Family history of brain aneurysm    GERD (gastroesophageal reflux disease)    Hyperlipidemia    Hypertension    MRSA (methicillin resistant Staphylococcus aureus)    skin infections   Rheumatoid arthritis(714.0)    transaminitis on MTX, remicade rxn, s/p sulfasalazine, orencia   Ulcer disease    Past Surgical History:  Procedure Laterality Date   CATARACT EXTRACTION W/PHACO Left 01/22/2021   Procedure: CATARACT EXTRACTION PHACO AND INTRAOCULAR LENS PLACEMENT ([place]) LEFT;  Surgeon: Eulogio Bear, MD;  Location: [facility];  Service: Ophthalmology;  Laterality: Left;  5.90 0:37.7   CATARACT EXTRACTION W/PHACO Right 02/05/2021   Procedure: CATARACT EXTRACTION PHACO AND INTRAOCULAR LENS PLACEMENT ([facility]) RIGHT 4.33 00:34.6;  Surgeon: Eulogio Bear, MD;  Location: [facility];  Service: Ophthalmology;  Laterality: Right;   CHOLECYSTECTOMY  2010   INTRACRANIAL ANEURYSM REPAIR  05/29/2016   MCP replacements  2001   left   TUBAL LIGATION     Family History  Problem Relation Age of Onset   Heart  disease Mother    Diabetes Mother    Hypercholesterolemia Mother    Heart disease Father    Heart disease Brother        MI age 24   Hypercholesterolemia Sister    Cancer Sister    Colon polyps Sister    Arthritis/Rheumatoid Paternal Aunt    Arthritis/Rheumatoid Paternal Uncle    Breast cancer Neg Hx    Social History   Socioeconomic History   Marital status: Widowed    Spouse name: Not on file   Number of children: 1   Years of education: Not on file   Highest education level: Not on file  Occupational History   Not on file  Tobacco Use   Smoking status: Former    Types: Cigarettes    Quit date: 12/30/1988    Years since quitting: 32.8   Smokeless tobacco: Never  Vaping Use   Vaping Use: Never used  Substance and Sexual Activity   Alcohol use: No    Alcohol/week: 0.0 standard drinks   Drug use: No   Sexual activity: Yes  Other Topics Concern   Not on file  Social History Narrative   Enjoys dancing    Social Determinants of Health   Financial Resource Strain: Low Risk    Difficulty of Paying Living Expenses: Not hard at all  Food Insecurity: No Food Insecurity   Worried About [place] in the Last Year: Never true   Ran Out of  Food in the Last Year: Never true  Transportation Needs: No Transportation Needs   Lack of Transportation (Medical): No   Lack of Transportation (Non-Medical): No  Physical Activity: Sufficiently Active   Days of Exercise per Week: 5 days   Minutes of Exercise per Session: 30 min  Stress: No Stress Concern Present   Feeling of Stress : Not at all  Social Connections: Unknown   Frequency of Communication with Friends and Family: More than three times a week   Frequency of Social Gatherings with Friends and Family: More than three times a week   Attends Religious Services: Not on file   Active Member of Clubs or Organizations: Not on file   Attends Archivist Meetings: Not on file   Marital Status: Widowed     Review of Systems  Constitutional:  Negative for appetite change and unexpected weight change.  HENT:  Negative for congestion and sinus pressure.   Respiratory:  Negative for cough, chest tightness and shortness of breath.   Cardiovascular:  Negative for chest pain, palpitations and leg swelling.  Gastrointestinal:  Negative for abdominal pain, diarrhea, nausea and vomiting.  Genitourinary:  Negative for difficulty urinating and dysuria.  Musculoskeletal:  Negative for joint swelling and myalgias.  Skin:  Negative for color change and rash.  Neurological:  Negative for dizziness, light-headedness and headaches.  Psychiatric/Behavioral:  Negative for agitation and dysphoric mood.       Objective:     BP 121/68 (BP Location: Left Arm, Patient Position: Sitting, Cuff Size: Normal)   Pulse 69   Temp 98.2 F (36.8 C) (Oral)   Ht _0  (1.499 m)   Wt 99 lb 11.2 oz (45.2 kg)   SpO2 99%   BMI 20.14 kg/m  Wt Readings from Last 3 Encounters:  10/11/21 99 lb 11.2 oz (45.2 kg)  03/15/21 100 lb 9.6 oz (45.6 kg)  02/05/21 102 lb (46.3 kg)    Physical Exam Vitals reviewed.  Constitutional:      General: She is not in acute distress.    Appearance: Normal appearance.  HENT:     Head: Normocephalic and atraumatic.     Right Ear: External ear normal.     Left Ear: External ear normal.  Eyes:     General: No scleral icterus.       Right eye: No discharge.        Left eye: No discharge.     Conjunctiva/sclera: Conjunctivae normal.  Neck:     Thyroid: No thyromegaly.  Cardiovascular:     Rate and Rhythm: Normal rate and regular rhythm.  Pulmonary:     Effort: No respiratory distress.     Breath sounds: Normal breath sounds. No wheezing.  Abdominal:     General: Bowel sounds are normal.     Palpations: Abdomen is soft.     Tenderness: There is no abdominal tenderness.  Musculoskeletal:        General: No swelling or tenderness.     Cervical back: Neck supple. No  tenderness.  Lymphadenopathy:     Cervical: No cervical adenopathy.  Skin:    Findings: No erythema or rash.  Neurological:     Mental Status: She is alert.  Psychiatric:        Mood and Affect: Mood normal.        Behavior: Behavior normal.     Outpatient Encounter Medications as of 10/11/2021  Medication Sig   abatacept (ORENCIA) 250 MG injection Once every month  albuterol (PROVENTIL HFA;VENTOLIN HFA) 108 (90 BASE) MCG/ACT inhaler Inhale 2 puffs into the lungs every 6 (six) hours as needed for wheezing or shortness of breath.   amLODipine (NORVASC) 2.5 MG tablet TAKE 1 TABLET BY MOUTH EVERY DAY   aspirin 81 MG chewable tablet Chew 81 mg by mouth daily.   calcium gluconate 650 MG tablet Take 650 mg by mouth daily.   carbamide peroxide (DEBROX) 6.5 % OTIC solution 5 drops in right ear q day.  Massage for approximately 5 minutes.   cholecalciferol (VITAMIN D) 1000 UNITS tablet Take 1,000 Units by mouth daily.   ELDERBERRY PO Take by mouth daily.   lovastatin (MEVACOR) 10 MG tablet TAKE 1 TABLET BY MOUTH EVERYDAY AT BEDTIME   magnesium oxide (MAG-OX) 400 MG tablet Take 400 mg by mouth daily.   methotrexate 50 MG/2ML injection INJECT 0.2MLS SUBCUTANEOUSLY WEEKLY DISPENSE 2 VIALS   pantoprazole (PROTONIX) 40 MG tablet TAKE 1 TABLET BY MOUTH EVERY DAY   telmisartan (MICARDIS) 40 MG tablet TAKE 1 TABLET BY MOUTH EVERY DAY   Triamcinolone Acetonide (NASACORT AQ NA) Place into the nose as needed.   valACYclovir (VALTREX) 1000 MG tablet TAKE 1 TABLET (1,000 MG TOTAL) BY MOUTH DAILY.   No facility-administered encounter medications on file as of 10/11/2021.     Lab Results  Component Value Date   WBC 5.8 08/27/2018   HGB 13.1 08/27/2018   HCT 39.5 08/27/2018   PLT 206.0 08/27/2018   GLUCOSE 82 01/05/2019   CHOL 186 01/05/2019   TRIG 135.0 01/05/2019   HDL 48.80 01/05/2019   LDLDIRECT 167.8 12/20/2013   LDLCALC 110 (H) 01/05/2019   ALT 11 01/05/2019   AST 17 01/05/2019   NA 134  (L) 01/05/2019   K 4.5 01/05/2019   CL 101 01/05/2019   CREATININE 0.89 01/05/2019   BUN 11 01/05/2019   CO2 27 01/05/2019   TSH 3.19 01/05/2019   INR 1.1 (H) 12/06/2013   HGBA1C 5.7 01/05/2019    US BREAST LTD UNI LEFT INC AXILLA  Result Date: 08/29/2021 CLINICAL DATA:  Recall for possible mass in the left breast. EXAM: DIGITAL DIAGNOSTIC UNILATERAL LEFT MAMMOGRAM WITH TOMOSYNTHESIS AND CAD; ULTRASOUND LEFT BREAST LIMITED TECHNIQUE: Left digital diagnostic mammography and breast tomosynthesis was performed. The images were evaluated with computer-aided detection.; Targeted ultrasound examination of the left breast was performed. COMPARISON:  Previous exam(s). ACR Breast Density Category b: There are scattered areas of fibroglandular density. FINDINGS: The previously noted possible mass in the outer upper left breast does not persist on additional views, consistent with overlapping fibroglandular tissue. Confirmatory targeted ultrasound of the upper-outer left breast at the 1:00 to 3:00 positions was performed. No suspicious solid or cystic mass. IMPRESSION: No mammographic or sonographic findings of malignancy in the left breast. RECOMMENDATION: Annual screening mammogram. I have discussed the findings and recommendations with the patient. If applicable, a reminder letter will be sent to the patient regarding the next appointment. BI-RADS CATEGORY  1: Negative. Electronically Signed   By: Ileana Roup M.D.   On: 08/29/2021 12:27  MM DIAG BREAST TOMO UNI LEFT  Result Date: 08/29/2021 CLINICAL DATA:  Recall for possible mass in the left breast. EXAM: DIGITAL DIAGNOSTIC UNILATERAL LEFT MAMMOGRAM WITH TOMOSYNTHESIS AND CAD; ULTRASOUND LEFT BREAST LIMITED TECHNIQUE: Left digital diagnostic mammography and breast tomosynthesis was performed. The images were evaluated with computer-aided detection.; Targeted ultrasound examination of the left breast was performed. COMPARISON:  Previous exam(s). ACR Breast  Density Category b: There are  scattered areas of fibroglandular density. FINDINGS: The previously noted possible mass in the outer upper left breast does not persist on additional views, consistent with overlapping fibroglandular tissue. Confirmatory targeted ultrasound of the upper-outer left breast at the 1:00 to 3:00 positions was performed. No suspicious solid or cystic mass. IMPRESSION: No mammographic or sonographic findings of malignancy in the left breast. RECOMMENDATION: Annual screening mammogram. I have discussed the findings and recommendations with the patient. If applicable, a reminder letter will be sent to the patient regarding the next appointment. BI-RADS CATEGORY  1: Negative. Electronically Signed   By: Ileana Roup M.D.   On: 08/29/2021 12:27      Assessment & Plan:   Problem List Items Addressed This Visit     Aortic atherosclerosis ([place])    Continue lovastatin.       Carotid bruit    Previously evaluated by [facility].  Schedule f/u.        Relevant Orders   Ambulatory referral to Vascular Surgery   Cerumen impaction    Debrox.  She will call for ear irrigation.        COPD with chronic bronchitis and emphysema ([place])    Has seen Dr Patsey Berthold.  Breathing stable.  Follow.       History of cerebral aneurysm     S/p stenting.  Being followed at [facility]. On plavix.  Last evaluated 07/14/19.  Recommended f/u in 2 years.  Need to confirm f/u.        Relevant Orders   Ambulatory referral to Neurology   Hypercholesteremia    On lovastatin.  Follow lipid panel and liver function tests.        Relevant Orders   Hepatic function panel   TSH   Lipid panel   Hyperglycemia    Follow met b and a1c.       Relevant Orders   Hemoglobin A1c   Hypertension - Primary    Continue micardis.  Blood pressure doing well.  Follow pressures.  Follow metabolic panel.       Relevant Orders   Basic metabolic panel   Hyponatremia    Has seen nephrology.  Follow  metabolic panel.       Lung nodules    Followed by pulmonary.  Had f/u CT scan 10/2020.   Stable.  Recommended f/u in 2 years.        Postmenopausal bleeding    Worked up by Dr Leafy Ro.  Ultrasound - no thickened endometrium.  Felt to be related to vaginal atrophy.  Estrogen cream.  Follow.  No further bleeding.       Rheumatoid arthritis ([place])    On MTX and orencia.  Doing well.  Followed by rheumatology.       Relevant Orders   CBC with Differential/Platelet   Weight loss    Weight relatively stable from last check.  Eating well.  Follow.         Einar Pheasant, MD

## 2021-10-12 NOTE — Telephone Encounter (Signed)
Orders placed for labs

## 2021-10-15 ENCOUNTER — Encounter: Payer: Self-pay | Admitting: Internal Medicine

## 2021-10-15 ENCOUNTER — Other Ambulatory Visit: Payer: Self-pay | Admitting: Internal Medicine

## 2021-10-15 DIAGNOSIS — H6120 Impacted cerumen, unspecified ear: Secondary | ICD-10-CM | POA: Insufficient documentation

## 2021-10-15 NOTE — Assessment & Plan Note (Signed)
Debrox.  She will call for ear irrigation.

## 2021-10-15 NOTE — Assessment & Plan Note (Signed)
On MTX and orencia.  Doing well.  Followed by rheumatology.  

## 2021-10-15 NOTE — Assessment & Plan Note (Signed)
On lovastatin.  Follow lipid panel and liver function tests.   

## 2021-10-15 NOTE — Assessment & Plan Note (Signed)
Weight relatively stable from last check.  Eating well.  Follow.

## 2021-10-15 NOTE — Assessment & Plan Note (Signed)
Followed by pulmonary.  Had f/u CT scan 10/2020.   Stable.  Recommended f/u in 2 years.   

## 2021-10-15 NOTE — Assessment & Plan Note (Signed)
Previously evaluated by [facility].  Schedule f/u.

## 2021-10-15 NOTE — Assessment & Plan Note (Signed)
Has seen nephrology.  Follow metabolic panel.   

## 2021-10-15 NOTE — Assessment & Plan Note (Signed)
S/p stenting.  Being followed at [facility]. On plavix.  Last evaluated 07/14/19.  Recommended f/u in 2 years.  Need to confirm f/u.

## 2021-10-15 NOTE — Assessment & Plan Note (Signed)
Continue micardis.  Blood pressure doing well.  Follow pressures.  Follow metabolic panel.   

## 2021-10-15 NOTE — Assessment & Plan Note (Signed)
Has seen Dr Gonzalez.  Breathing stable.  Follow.  

## 2021-10-15 NOTE — Assessment & Plan Note (Signed)
Worked up by Dr Dalbert Garnet.  Ultrasound - no thickened endometrium.  Felt to be related to vaginal atrophy.  Estrogen cream.  Follow.  No further bleeding.

## 2021-10-15 NOTE — Assessment & Plan Note (Signed)
Follow met b and a1c.  

## 2021-10-15 NOTE — Assessment & Plan Note (Signed)
Continue lovastatin 

## 2021-11-05 ENCOUNTER — Other Ambulatory Visit (INDEPENDENT_AMBULATORY_CARE_PROVIDER_SITE_OTHER): Payer: Self-pay | Admitting: Nurse Practitioner

## 2021-11-05 DIAGNOSIS — R0989 Other specified symptoms and signs involving the circulatory and respiratory systems: Secondary | ICD-10-CM

## 2021-11-07 DIAGNOSIS — I671 Cerebral aneurysm, nonruptured: Secondary | ICD-10-CM | POA: Insufficient documentation

## 2021-11-07 NOTE — Progress Notes (Signed)
MRN : [id number]  Erica Little is a 75 y.o. (1946-08-07) female who presents with chief complaint of check the carotid arteries.  History of Present Illness:   The patient is seen for evaluation of carotid stenosis. The carotid stenosis was identified remotely by duplex ultrasound after a right sided bruit was identified.  The patient denies amaurosis fugax. There is no recent history of TIA symptoms or focal motor deficits. There is no prior documented CVA.  The patient is status post right supraclinoid internal carotid artery aneurysm repair in May 2017   There is no history of migraine headaches. There is no history of seizures.  The patient is taking enteric-coated aspirin 81 mg daily.  The patient has a history of coronary artery disease, no recent episodes of angina or shortness of breath. The patient denies PAD or claudication symptoms. There is a history of hyperlipidemia which is being treated with a statin.   Duplex ultrasound of the carotid arteries obtained today demonstrates less than 30% right internal carotid artery stenosis (there is a greater than 50% right external carotid artery stenosis which was previously noted) and the left internal carotid artery is less than 30% diameter reduction.  There is no significant change when compared to the study dated March 19, 2016  No outpatient medications have been marked as taking for the 11/08/21 encounter (Appointment) with Gilda Crease, Latina Craver, MD.    Past Medical History:  Diagnosis Date   Family history of adverse reaction to anesthesia    Mother - PONV   Family history of brain aneurysm    GERD (gastroesophageal reflux disease)    Hyperlipidemia    Hypertension    MRSA (methicillin resistant Staphylococcus aureus)    skin infections   Rheumatoid arthritis(714.0)    transaminitis on MTX, remicade rxn, s/p sulfasalazine, orencia   Ulcer disease     Past Surgical History:  Procedure Laterality Date    CATARACT EXTRACTION W/PHACO Left 01/22/2021   Procedure: CATARACT EXTRACTION PHACO AND INTRAOCULAR LENS PLACEMENT (IOC) LEFT;  Surgeon: Nevada Crane, MD;  Location: [facility];  Service: Ophthalmology;  Laterality: Left;  5.90 0:37.7   CATARACT EXTRACTION W/PHACO Right 02/05/2021   Procedure: CATARACT EXTRACTION PHACO AND INTRAOCULAR LENS PLACEMENT (IOC) RIGHT 4.33 00:34.6;  Surgeon: Nevada Crane, MD;  Location: [facility];  Service: Ophthalmology;  Laterality: Right;   CHOLECYSTECTOMY  2010   INTRACRANIAL ANEURYSM REPAIR  05/29/2016   MCP replacements  2001   left   TUBAL LIGATION      Social History Social History   Tobacco Use   Smoking status: Former    Types: Cigarettes    Quit date: 12/30/1988    Years since quitting: 32.8   Smokeless tobacco: Never  Vaping Use   Vaping Use: Never used  Substance Use Topics   Alcohol use: No    Alcohol/week: 0.0 standard drinks   Drug use: No    Family History Family History  Problem Relation Age of Onset   Heart disease Mother    Diabetes Mother    Hypercholesterolemia Mother    Heart disease Father    Heart disease Brother        MI age 8   Hypercholesterolemia Sister    Cancer Sister    Colon polyps Sister    Arthritis/Rheumatoid Paternal Aunt    Arthritis/Rheumatoid Paternal Uncle    Breast cancer Neg Hx     Allergies  Allergen Reactions   Oxycodone Nausea  Only   Remicade [Infliximab] Other (See Comments)    Lightheadedness   Simvastatin     Joint pain   Sulfa Antibiotics     Stomach ulcers   Zetia [Ezetimibe] Other (See Comments)    Muscle aches    Latex Rash    "Tape tears skin"   Tape Rash    "tape tears skin"      REVIEW OF SYSTEMS (Negative unless checked)  Constitutional: [] Weight loss  [] Fever  [] Chills Cardiac: [] Chest pain   [] Chest pressure   [] Palpitations   [] Shortness of breath when laying flat   [] Shortness of breath with exertion. Vascular:  [] Pain in legs with  walking   [] Pain in legs at rest  [] History of DVT   [] Phlebitis   [] Swelling in legs   [] Varicose veins   [] Non-healing ulcers Pulmonary:   [] Uses home oxygen   [] Productive cough   [] Hemoptysis   [] Wheeze  [] COPD   [] Asthma Neurologic:  [] Dizziness   [] Seizures   [] History of stroke   [] History of TIA  [] Aphasia   [] Vissual changes   [] Weakness or numbness in arm   [] Weakness or numbness in leg Musculoskeletal:   [] Joint swelling   [] Joint pain   [] Low back pain Hematologic:  [] Easy bruising  [] Easy bleeding   [] Hypercoagulable state   [] Anemic Gastrointestinal:  [] Diarrhea   [] Vomiting  [] Gastroesophageal reflux/heartburn   [] Difficulty swallowing. Genitourinary:  [] Chronic kidney disease   [] Difficult urination  [] Frequent urination   [] Blood in urine Skin:  [] Rashes   [] Ulcers  Psychological:  [] History of anxiety   []  History of major depression.  Physical Examination  There were no vitals filed for this visit. There is no height or weight on file to calculate BMI. Gen: WD/WN, NAD Head: [place]/AT, No temporalis wasting.  Ear/Nose/Throat: Hearing grossly intact, nares w/o erythema or drainage Eyes: PER, EOMI, sclera nonicteric.  Neck: Supple, no masses.  No bruit or JVD.  Pulmonary:  Good air movement, no audible wheezing, no use of accessory muscles.  Cardiac: RRR, normal S1, S2, no Murmurs. Vascular:   right carotid bruit Vessel Right Left  Radial Palpable Palpable  Carotid Palpable Palpable  Gastrointestinal: soft, non-distended. No guarding/no peritoneal signs.  Musculoskeletal: M/S 5/5 throughout.  No visible deformity.  Neurologic: CN 2-12 intact. Pain and light touch intact in extremities.  Symmetrical.  Speech is fluent. Motor exam as listed above. Psychiatric: Judgment intact, Mood & affect appropriate for pt's clinical situation. Dermatologic: No rashes or ulcers noted.  No changes consistent with cellulitis.   CBC Lab Results  Component Value Date   WBC 5.8 08/27/2018    HGB 13.1 08/27/2018   HCT 39.5 08/27/2018   MCV 90.9 08/27/2018   PLT 206.0 08/27/2018    BMET    Component Value Date/Time   NA 134 (L) 01/05/2019 1003   K 4.5 01/05/2019 1003   CL 101 01/05/2019 1003   CO2 27 01/05/2019 1003   GLUCOSE 82 01/05/2019 1003   BUN 11 01/05/2019 1003   CREATININE 0.89 01/05/2019 1003   CALCIUM 9.6 01/05/2019 1003   GFRNONAA 51 (L) 11/18/2015 2250   GFRAA 60 (L) 11/18/2015 2250   CrCl cannot be calculated (Patient's most recent lab result is older than the maximum 21 days allowed.).  COAG Lab Results  Component Value Date   INR 1.1 (H) 12/06/2013    Radiology No results found.   Assessment/Plan 1. Bilateral carotid artery stenosis Recommend:  Given the patient's asymptomatic subcritical stenosis no  further invasive testing or surgery at this time.  Duplex ultrasound shows <30% stenosis bilaterally.  Continue antiplatelet therapy as prescribed Continue management of CAD, HTN and Hyperlipidemia Healthy heart diet,  encouraged exercise at least 4 times per week Follow up in 24 months with duplex ultrasound and physical exam.    - VAS US CAROTID; Future  2. Internal carotid aneurysm Continue plavix and ASA  Follow up with [facility]  3. COPD with chronic bronchitis and emphysema (HCC) Continue pulmonary medications and aerosols as already ordered, these medications have been reviewed and there are no changes at this time.    4. Hypercholesteremia Continue statin as ordered and reviewed, no changes at this time     Levora Dredge, MD  11/07/2021 1:47 PM

## 2021-11-08 ENCOUNTER — Encounter (INDEPENDENT_AMBULATORY_CARE_PROVIDER_SITE_OTHER): Payer: Self-pay | Admitting: Vascular Surgery

## 2021-11-08 ENCOUNTER — Ambulatory Visit (INDEPENDENT_AMBULATORY_CARE_PROVIDER_SITE_OTHER): Payer: Medicare HMO

## 2021-11-08 ENCOUNTER — Ambulatory Visit (INDEPENDENT_AMBULATORY_CARE_PROVIDER_SITE_OTHER): Payer: Medicare HMO | Admitting: Vascular Surgery

## 2021-11-08 ENCOUNTER — Other Ambulatory Visit: Payer: Self-pay

## 2021-11-08 VITALS — BP 156/67 | HR 65 | Resp 16 | Ht 59.0 in | Wt 103.2 lb

## 2021-11-08 DIAGNOSIS — I671 Cerebral aneurysm, nonruptured: Secondary | ICD-10-CM

## 2021-11-08 DIAGNOSIS — R0989 Other specified symptoms and signs involving the circulatory and respiratory systems: Secondary | ICD-10-CM

## 2021-11-08 DIAGNOSIS — G453 Amaurosis fugax: Secondary | ICD-10-CM

## 2021-11-08 DIAGNOSIS — I6523 Occlusion and stenosis of bilateral carotid arteries: Secondary | ICD-10-CM

## 2021-11-08 DIAGNOSIS — E7800 Pure hypercholesterolemia, unspecified: Secondary | ICD-10-CM

## 2021-11-08 DIAGNOSIS — J449 Chronic obstructive pulmonary disease, unspecified: Secondary | ICD-10-CM | POA: Diagnosis not present

## 2021-11-08 DIAGNOSIS — I779 Disorder of arteries and arterioles, unspecified: Secondary | ICD-10-CM | POA: Insufficient documentation

## 2021-11-08 DIAGNOSIS — I6529 Occlusion and stenosis of unspecified carotid artery: Secondary | ICD-10-CM | POA: Insufficient documentation

## 2021-11-26 ENCOUNTER — Other Ambulatory Visit: Payer: Self-pay

## 2021-11-26 ENCOUNTER — Other Ambulatory Visit (INDEPENDENT_AMBULATORY_CARE_PROVIDER_SITE_OTHER): Payer: Medicare HMO

## 2021-11-26 DIAGNOSIS — R739 Hyperglycemia, unspecified: Secondary | ICD-10-CM | POA: Diagnosis not present

## 2021-11-26 DIAGNOSIS — M069 Rheumatoid arthritis, unspecified: Secondary | ICD-10-CM | POA: Diagnosis not present

## 2021-11-26 DIAGNOSIS — I10 Essential (primary) hypertension: Secondary | ICD-10-CM | POA: Diagnosis not present

## 2021-11-26 DIAGNOSIS — E7800 Pure hypercholesterolemia, unspecified: Secondary | ICD-10-CM | POA: Diagnosis not present

## 2021-11-26 LAB — CBC WITH DIFFERENTIAL/PLATELET
Basophils Absolute: 0.1 10*3/uL (ref 0.0–0.1)
Basophils Relative: 1 % (ref 0.0–3.0)
Eosinophils Absolute: 0.4 10*3/uL (ref 0.0–0.7)
Eosinophils Relative: 6.1 % — ABNORMAL HIGH (ref 0.0–5.0)
HCT: 38.9 % (ref 36.0–46.0)
Hemoglobin: 12.6 g/dL (ref 12.0–15.0)
Lymphocytes Relative: 24.4 % (ref 12.0–46.0)
Lymphs Abs: 1.4 10*3/uL (ref 0.7–4.0)
MCHC: 32.4 g/dL (ref 30.0–36.0)
MCV: 92.7 fl (ref 78.0–100.0)
Monocytes Absolute: 0.6 10*3/uL (ref 0.1–1.0)
Monocytes Relative: 9.4 % (ref 3.0–12.0)
Neutro Abs: 3.5 10*3/uL (ref 1.4–7.7)
Neutrophils Relative %: 59.1 % (ref 43.0–77.0)
Platelets: 206 10*3/uL (ref 150.0–400.0)
RBC: 4.19 Mil/uL (ref 3.87–5.11)
RDW: 15.6 % — ABNORMAL HIGH (ref 11.5–15.5)
WBC: 5.9 10*3/uL (ref 4.0–10.5)

## 2021-11-26 LAB — LIPID PANEL
Cholesterol: 198 mg/dL (ref 0–200)
HDL: 57.7 mg/dL (ref >39.00)
LDL Cholesterol: 120 mg/dL — ABNORMAL HIGH (ref 0–99)
NonHDL: 139.89
Total CHOL/HDL Ratio: 3
Triglycerides: 101 mg/dL (ref 0.0–149.0)
VLDL: 20.2 mg/dL (ref 0.0–40.0)

## 2021-11-26 LAB — BASIC METABOLIC PANEL
BUN: 10 mg/dL (ref 6–23)
CO2: 25 mEq/L (ref 19–32)
Calcium: 9.2 mg/dL (ref 8.4–10.5)
Chloride: 99 mEq/L (ref 96–112)
Creatinine, Ser: 0.92 mg/dL (ref 0.40–1.20)
GFR: 60.3 mL/min (ref >60.00)
Glucose, Bld: 79 mg/dL (ref 70–99)
Potassium: 4.3 mEq/L (ref 3.5–5.1)
Sodium: 133 mEq/L — ABNORMAL LOW (ref 135–145)

## 2021-11-26 LAB — HEMOGLOBIN A1C: Hgb A1c MFr Bld: 5.8 % (ref 4.6–6.5)

## 2021-11-26 LAB — HEPATIC FUNCTION PANEL
ALT: 11 U/L (ref 0–35)
AST: 18 U/L (ref 0–37)
Albumin: 4.2 g/dL (ref 3.5–5.2)
Alkaline Phosphatase: 72 U/L (ref 39–117)
Bilirubin, Direct: 0.1 mg/dL (ref 0.0–0.3)
Total Bilirubin: 0.8 mg/dL (ref 0.2–1.2)
Total Protein: 6.2 g/dL (ref 6.0–8.3)

## 2021-11-26 LAB — TSH: TSH: 2.71 u[IU]/mL (ref 0.35–5.50)

## 2021-11-28 ENCOUNTER — Other Ambulatory Visit: Payer: Self-pay | Admitting: Internal Medicine

## 2021-11-28 MED ORDER — LOVASTATIN 10 MG PO TABS: ORAL_TABLET | ORAL | 1 refills | Status: DC

## 2021-11-28 NOTE — Progress Notes (Signed)
Rx sent in for lovastatin - 2 tablets alternating with one tablet qod.  #135 with one refill.

## 2021-11-29 ENCOUNTER — Ambulatory Visit (INDEPENDENT_AMBULATORY_CARE_PROVIDER_SITE_OTHER): Payer: Medicare HMO

## 2021-11-29 VITALS — Ht 59.0 in | Wt 103.0 lb

## 2021-11-29 DIAGNOSIS — Z0000 Encounter for general adult medical examination without abnormal findings: Secondary | ICD-10-CM

## 2021-11-29 NOTE — Progress Notes (Signed)
Subjective:   Erica Little is a 75 y.o. female who presents for Medicare Annual (Subsequent) preventive examination.  Review of Systems    No ROS.  Medicare Wellness Virtual Visit.  Visual/audio telehealth visit, UTA vital signs.   See social history for additional risk factors.   Cardiac Risk Factors include: advanced age (>46men, >62 women)     Objective:    Today's Vitals   11/29/21 1036  Weight: 103 lb (46.7 kg)  Height: 4\' 11"  (1.499 m)   Body mass index is 20.8 kg/m.  Advanced Directives 11/29/2021 02/05/2021 01/22/2021 11/28/2020 11/24/2019 11/17/2018 11/06/2017  Does Patient Have a Medical Advance Directive? Yes - Yes Yes Yes Yes Yes  Type of 13/07/2017 of [place];Living will Living will Healthcare Power of [place];Living will Healthcare Power of [place];Living will Healthcare Power of [place];Living will Healthcare Power of [place];Living will Living will;Healthcare Power of Attorney  Does patient want to make changes to medical advance directive? No - Patient declined - No - Patient declined No - Patient declined No - Patient declined No - Patient declined No - Patient declined  Copy of Healthcare Power of Attorney in Chart? No - copy requested Yes - validated most recent copy scanned in chart (See row information) No - copy requested No - copy requested No - copy requested No - copy requested No - copy requested    Current Medications (verified) Outpatient Encounter Medications as of 11/29/2021  Medication Sig   abatacept (ORENCIA) 250 MG injection Once every month   albuterol (PROVENTIL HFA;VENTOLIN HFA) 108 (90 BASE) MCG/ACT inhaler Inhale 2 puffs into the lungs every 6 (six) hours as needed for wheezing or shortness of breath.   amLODipine (NORVASC) 2.5 MG tablet TAKE 1 TABLET BY MOUTH EVERY DAY   aspirin 81 MG chewable tablet Chew 81 mg by mouth daily.   calcium gluconate 650 MG tablet Take 650 mg by mouth daily.   carbamide peroxide  (DEBROX) 6.5 % OTIC solution 5 drops in right ear q day.  Massage for approximately 5 minutes.   cholecalciferol (VITAMIN D) 1000 UNITS tablet Take 1,000 Units by mouth daily.   ELDERBERRY PO Take by mouth daily.   lovastatin (MEVACOR) 10 MG tablet Take two tablets alternating with one tablet qod.   magnesium oxide (MAG-OX) 400 MG tablet Take 400 mg by mouth daily.   methotrexate 50 MG/2ML injection INJECT 0.2MLS SUBCUTANEOUSLY WEEKLY DISPENSE 2 VIALS   pantoprazole (PROTONIX) 40 MG tablet TAKE 1 TABLET BY MOUTH EVERY DAY   telmisartan (MICARDIS) 40 MG tablet TAKE 1 TABLET BY MOUTH EVERY DAY   Triamcinolone Acetonide (NASACORT AQ NA) Place into the nose as needed.   valACYclovir (VALTREX) 1000 MG tablet TAKE 1 TABLET (1,000 MG TOTAL) BY MOUTH DAILY.   No facility-administered encounter medications on file as of 11/29/2021.    Allergies (verified) Oxycodone, Remicade [infliximab], Simvastatin, Sulfa antibiotics, Zetia [ezetimibe], Latex, and Tape   History: Past Medical History:  Diagnosis Date   Family history of adverse reaction to anesthesia    Mother - PONV   Family history of brain aneurysm    GERD (gastroesophageal reflux disease)    Hyperlipidemia    Hypertension    MRSA (methicillin resistant Staphylococcus aureus)    skin infections   Rheumatoid arthritis(714.0)    transaminitis on MTX, remicade rxn, s/p sulfasalazine, orencia   Ulcer disease    Past Surgical History:  Procedure Laterality Date   CATARACT EXTRACTION [facility] Left 01/22/2021  Procedure: CATARACT EXTRACTION PHACO AND INTRAOCULAR LENS PLACEMENT (IOC) LEFT;  Surgeon: Nevada Crane, MD;  Location: [facility];  Service: Ophthalmology;  Laterality: Left;  5.90 0:37.7   CATARACT EXTRACTION W/PHACO Right 02/05/2021   Procedure: CATARACT EXTRACTION PHACO AND INTRAOCULAR LENS PLACEMENT (IOC) RIGHT 4.33 00:34.6;  Surgeon: Nevada Crane, MD;  Location: [facility];  Service: Ophthalmology;   Laterality: Right;   CHOLECYSTECTOMY  2010   INTRACRANIAL ANEURYSM REPAIR  05/29/2016   MCP replacements  2001   left   TUBAL LIGATION     Family History  Problem Relation Age of Onset   Heart disease Mother    Diabetes Mother    Hypercholesterolemia Mother    Heart disease Father    Heart disease Brother        MI age 75   Hypercholesterolemia Sister    Cancer Sister    Colon polyps Sister    Arthritis/Rheumatoid Paternal Aunt    Arthritis/Rheumatoid Paternal Uncle    Breast cancer Neg Hx    Social History   Socioeconomic History   Marital status: Widowed    Spouse name: Not on file   Number of children: 1   Years of education: Not on file   Highest education level: Not on file  Occupational History   Not on file  Tobacco Use   Smoking status: Former    Types: Cigarettes    Quit date: 12/30/1988    Years since quitting: 32.9   Smokeless tobacco: Never  Vaping Use   Vaping Use: Never used  Substance and Sexual Activity   Alcohol use: No    Alcohol/week: 0.0 standard drinks   Drug use: No   Sexual activity: Yes  Other Topics Concern   Not on file  Social History Narrative   Enjoys dancing    Social Determinants of Health   Financial Resource Strain: Low Risk    Difficulty of Paying Living Expenses: Not hard at all  Food Insecurity: No Food Insecurity   Worried About Programme researcher, broadcasting/film/video in the Last Year: Never true   Ran Out of Food in the Last Year: Never true  Transportation Needs: No Transportation Needs   Lack of Transportation (Medical): No   Lack of Transportation (Non-Medical): No  Physical Activity: Sufficiently Active   Days of Exercise per Week: 5 days   Minutes of Exercise per Session: 30 min  Stress: No Stress Concern Present   Feeling of Stress : Not at all  Social Connections: Unknown   Frequency of Communication with Friends and Family: More than three times a week   Frequency of Social Gatherings with Friends and Family: More than three  times a week   Attends Religious Services: Not on file   Active Member of Clubs or Organizations: Not on file   Attends Banker Meetings: Not on file   Marital Status: Widowed    Tobacco Counseling Counseling given: Not Answered   Clinical Intake:  Pre-visit preparation completed: Yes        Diabetes: No  How often do you need to have someone help you when you read instructions, pamphlets, or other written materials from your doctor or pharmacy?: 1 - Never   Interpreter Needed?: No      Activities of Daily Living In your present state of health, do you have any difficulty performing the following activities: 11/29/2021 01/22/2021  Hearing? N N  Vision? N N  Difficulty concentrating or making decisions? N  N  Walking or climbing stairs? N -  Comment Paces self and holds on to railings. -  Dressing or bathing? N N  Doing errands, shopping? Y -  Comment Accompanied for driving as needed -  Preparing Food and eating ? N -  Using the Toilet? N -  In the past six months, have you accidently leaked urine? N -  Do you have problems with loss of bowel control? N -  Managing your Medications? N -  Managing your Finances? N -  Housekeeping or managing your Housekeeping? N -  Some recent data might be hidden    Patient Care Team: Dale [place], MD as PCP - General (Internal Medicine)  Indicate any recent Medical Services you may have received from other than [facility] providers in the past year (date may be approximate).     Assessment:   This is a routine wellness examination for [place].  Virtual Visit via Telephone Note  I connected with  Charlann Noss on 11/29/21 at 10:30 AM EST by telephone and verified that I am speaking with the correct person using two identifiers.  Location: Patient: home Provider: office Persons participating in the virtual visit: patient/Nurse Health Advisor   I discussed the limitations, risks, security and privacy concerns  of performing an evaluation and management service by telephone and the availability of in person appointments. The patient expressed understanding and agreed to proceed.  Interactive audio and video telecommunications were attempted between this nurse and patient, however failed, due to patient having technical difficulties OR patient did not have access to video capability.  We continued and completed visit with audio only.  Some vital signs may be absent or patient reported.   Hearing/Vision screen Hearing Screening - Comments:: Patient is able to hear conversational tones without difficulty. No issues reported.  Vision Screening - Comments:: Followed by [facility] Cataracts extracted, bilateral They have regular follow up with the ophthalmologist  Dietary issues and exercise activities discussed: Current Exercise Habits: Home exercise routine, Type of exercise: stretching;walking, Intensity: Mild Healthy diet Good water intake   Goals Addressed             This Visit's Progress    Follow up with Primary Care Provider       As needed       Depression Screen [facility] 2/9 Scores 11/29/2021 11/28/2020 11/24/2019 11/17/2018 11/06/2017 12/05/2016 05/08/2015  PHQ - 2 Score 0 0 0 0 0 0 0  PHQ- 9 Score - - - - - - -    Fall Risk Fall Risk  11/29/2021 11/28/2020 11/24/2019 11/17/2018 11/06/2017  Falls in the past year? 0 0 0 0 No  Number falls in past yr: - 0 - - -  Injury with Fall? - 0 - - -  Follow up Falls evaluation completed Falls evaluation completed Education provided;Falls prevention discussed - -    FALL RISK PREVENTION PERTAINING TO THE HOME: Home free of loose throw rugs in walkways, pet beds, electrical cords, etc? Yes  Adequate lighting in your home to reduce risk of falls? Yes   ASSISTIVE DEVICES UTILIZED TO PREVENT FALLS: Use of a cane, walker or w/c? No   TIMED UP AND GO: Was the test performed? No .   Cognitive Function: Patient is alert and oriented  x3. MMSE - Mini Mental State Exam 11/06/2017  Orientation to time 5  Orientation to Place 5  Registration 3  Attention/ Calculation 5  Recall 3  Language- name 2 objects  2  Language- repeat 1  Language- follow 3 step command 3  Language- read & follow direction 1  Write a sentence 1  Copy design 1  Total score 30     6CIT Screen 11/29/2021 11/28/2020 11/24/2019 11/17/2018  What Year? 0 points 0 points 0 points 0 points  What month? 0 points 0 points 0 points 0 points  What time? 0 points 0 points 0 points 0 points  Count back from 20 0 points 0 points 0 points 0 points  Months in reverse 0 points 0 points 0 points 0 points  Repeat phrase 0 points 0 points 0 points 0 points  Total Score 0 0 0 0   Immunizations Immunization History  Administered Date(s) Administered   PFIZER(Purple Top)SARS-COV-2 Vaccination 01/25/2020, 02/15/2020, 12/10/2020   Pneumococcal Conjugate-13 11/25/2017   Pneumococcal Polysaccharide-23 12/10/2018   TDAP status: Due, Education has been provided regarding the importance of this vaccine. Advised may receive this vaccine at local pharmacy or Health Dept. Aware to provide a copy of the vaccination record if obtained from local pharmacy or Health Dept. Verbalized acceptance and understanding.  Shingrix Completed?: No.    Education has been provided regarding the importance of this vaccine. Patient has been advised to call insurance company to determine out of pocket expense if they have not yet received this vaccine. Advised may also receive vaccine at local pharmacy or Health Dept. Verbalized acceptance and understanding.  Screening Tests Health Maintenance  Topic Date Due   COVID-19 Vaccine (4 - Booster for Pfizer series) 12/15/2021 (Originally 02/04/2021)   Zoster Vaccines- Shingrix (1 of 2) 02/27/2022 (Originally 12/21/1965)   INFLUENZA VACCINE  03/29/2022 (Originally 07/30/2021)   COLONOSCOPY (Pts 45-84yrs Insurance coverage will need to be confirmed)   11/29/2022 (Originally 09/24/2019)   TETANUS/TDAP  11/29/2022 (Originally 12/21/1965)   Hepatitis C Screening  11/29/2022 (Originally 12/20/1964)   MAMMOGRAM  08/14/2022   Pneumonia Vaccine 30+ Years old  Completed   DEXA SCAN  Completed   HPV VACCINES  Aged Out   Health Maintenance There are no preventive care reminders to display for this patient.  Colonoscopy- deferred.   Hep C Screening- deferred.   Vision Screening: Recommended annual ophthalmology exams for early detection of glaucoma and other disorders of the eye. Is the patient up to date with their annual eye exam?  Yes   Dental Screening: Recommended annual dental exams for proper oral hygiene  Community Resource Referral / Chronic Care Management: CRR required this visit?  No   CCM required this visit?  No      Plan:   Keep all routine maintenance appointments.   I have personally reviewed and noted the following in the patient's chart:   Medical and social history Use of alcohol, tobacco or illicit drugs  Current medications and supplements including opioid prescriptions. Not taking opioid.  Functional ability and status Nutritional status Physical activity Advanced directives List of other physicians Hospitalizations, surgeries, and ER visits in previous 12 months Vitals Screenings to include cognitive, depression, and falls Referrals and appointments  In addition, I have reviewed and discussed with patient certain preventive protocols, quality metrics, and best practice recommendations. A written personalized care plan for preventive services as well as general preventive health recommendations were provided to patient.     Ashok Pall, LPN   [id number]

## 2021-11-29 NOTE — Patient Instructions (Addendum)
Erica Little , Thank you for taking time to come for your Medicare Wellness Visit. I appreciate your ongoing commitment to your health goals. Please review the following plan we discussed and let me know if I can assist you in the future.   These are the goals we discussed:  Goals      Follow up with Primary Care Provider     As needed        This is a list of the screening recommended for you and due dates:  Health Maintenance  Topic Date Due   COVID-19 Vaccine (4 - Booster for Pfizer series) 12/15/2021*   Zoster (Shingles) Vaccine (1 of 2) 02/27/2022*   Flu Shot  03/29/2022*   Colon Cancer Screening  11/29/2022*   Tetanus Vaccine  11/29/2022*   Hepatitis C Screening: USPSTF Recommendation to screen - Ages 18-79 yo.  11/29/2022*   Mammogram  08/14/2022   Pneumonia Vaccine  Completed   DEXA scan (bone density measurement)  Completed   HPV Vaccine  Aged Out  *Topic was postponed. The date shown is not the original due date.    Advanced directives: End of life planning; Advance aging; Advanced directives discussed.  Copy of current HCPOA/Living Will requested.    Conditions/risks identified: none new  Follow up in one year for your annual wellness visit    Preventive Care 65 Years and Older, Female Preventive care refers to lifestyle choices and visits with your health care provider that can promote health and wellness. What does preventive care include? A yearly physical exam. This is also called an annual well check. Dental exams once or twice a year. Routine eye exams. Ask your health care provider how often you should have your eyes checked. Personal lifestyle choices, including: Daily care of your teeth and gums. Regular physical activity. Eating a healthy diet. Avoiding tobacco and drug use. Limiting alcohol use. Practicing safe sex. Taking low-dose aspirin every day. Taking vitamin and mineral supplements as recommended by your health care provider. What happens  during an annual well check? The services and screenings done by your health care provider during your annual well check will depend on your age, overall health, lifestyle risk factors, and family history of disease. Counseling  Your health care provider may ask you questions about your: Alcohol use. Tobacco use. Drug use. Emotional well-being. Home and relationship well-being. Sexual activity. Eating habits. History of falls. Memory and ability to understand (cognition). Work and work Astronomer. Reproductive health. Screening  You may have the following tests or measurements: Height, weight, and BMI. Blood pressure. Lipid and cholesterol levels. These may be checked every 5 years, or more frequently if you are over 26 years old. Skin check. Lung cancer screening. You may have this screening every year starting at age 44 if you have a 30-pack-year history of smoking and currently smoke or have quit within the past 15 years. Fecal occult blood test (FOBT) of the stool. You may have this test every year starting at age 37. Flexible sigmoidoscopy or colonoscopy. You may have a sigmoidoscopy every 5 years or a colonoscopy every 10 years starting at age 8. Hepatitis C blood test. Hepatitis B blood test. Sexually transmitted disease (STD) testing. Diabetes screening. This is done by checking your blood sugar (glucose) after you have not eaten for a while (fasting). You may have this done every 1-3 years. Bone density scan. This is done to screen for osteoporosis. You may have this done starting at age 88. Mammogram.  This may be done every 1-2 years. Talk to your health care provider about how often you should have regular mammograms. Talk with your health care provider about your test results, treatment options, and if necessary, the need for more tests. Vaccines  Your health care provider may recommend certain vaccines, such as: Influenza vaccine. This is recommended every  year. Tetanus, diphtheria, and acellular pertussis (Tdap, Td) vaccine. You may need a Td booster every 10 years. Zoster vaccine. You may need this after age 69. Pneumococcal 13-valent conjugate (PCV13) vaccine. One dose is recommended after age 37. Pneumococcal polysaccharide (PPSV23) vaccine. One dose is recommended after age 26. Talk to your health care provider about which screenings and vaccines you need and how often you need them. This information is not intended to replace advice given to you by your health care provider. Make sure you discuss any questions you have with your health care provider. Document Released: 01/12/2016 Document Revised: 09/04/2016 Document Reviewed: 10/17/2015 Elsevier Interactive Patient Education  2017 [place] Prevention in the Home Falls can cause injuries. They can happen to people of all ages. There are many things you can do to make your home safe and to help prevent falls. What can I do on the outside of my home? Regularly fix the edges of walkways and driveways and fix any cracks. Remove anything that might make you trip as you walk through a door, such as a raised step or threshold. Trim any bushes or trees on the path to your home. Use bright outdoor lighting. Clear any walking paths of anything that might make someone trip, such as rocks or tools. Regularly check to see if handrails are loose or broken. Make sure that both sides of any steps have handrails. Any raised decks and porches should have guardrails on the edges. Have any leaves, snow, or ice cleared regularly. Use sand or salt on walking paths during winter. Clean up any spills in your garage right away. This includes oil or grease spills. What can I do in the bathroom? Use night lights. Install grab bars by the toilet and in the tub and shower. Do not use towel bars as grab bars. Use non-skid mats or decals in the tub or shower. If you need to sit down in the shower, use a  plastic, non-slip stool. Keep the floor dry. Clean up any water that spills on the floor as soon as it happens. Remove soap buildup in the tub or shower regularly. Attach bath mats securely with double-sided non-slip rug tape. Do not have throw rugs and other things on the floor that can make you trip. What can I do in the bedroom? Use night lights. Make sure that you have a light by your bed that is easy to reach. Do not use any sheets or blankets that are too big for your bed. They should not hang down onto the floor. Have a firm chair that has side arms. You can use this for support while you get dressed. Do not have throw rugs and other things on the floor that can make you trip. What can I do in the kitchen? Clean up any spills right away. Avoid walking on wet floors. Keep items that you use a lot in easy-to-reach places. If you need to reach something above you, use a strong step stool that has a grab bar. Keep electrical cords out of the way. Do not use floor polish or wax that makes floors slippery. If you  must use wax, use non-skid floor wax. Do not have throw rugs and other things on the floor that can make you trip. What can I do with my stairs? Do not leave any items on the stairs. Make sure that there are handrails on both sides of the stairs and use them. Fix handrails that are broken or loose. Make sure that handrails are as long as the stairways. Check any carpeting to make sure that it is firmly attached to the stairs. Fix any carpet that is loose or worn. Avoid having throw rugs at the top or bottom of the stairs. If you do have throw rugs, attach them to the floor with carpet tape. Make sure that you have a light switch at the top of the stairs and the bottom of the stairs. If you do not have them, ask someone to add them for you. What else can I do to help prevent falls? Wear shoes that: Do not have high heels. Have rubber bottoms. Are comfortable and fit you  well. Are closed at the toe. Do not wear sandals. If you use a stepladder: Make sure that it is fully opened. Do not climb a closed stepladder. Make sure that both sides of the stepladder are locked into place. Ask someone to hold it for you, if possible. Clearly mark and make sure that you can see: Any grab bars or handrails. First and last steps. Where the edge of each step is. Use tools that help you move around (mobility aids) if they are needed. These include: Canes. Walkers. Scooters. Crutches. Turn on the lights when you go into a dark area. Replace any light bulbs as soon as they burn out. Set up your furniture so you have a clear path. Avoid moving your furniture around. If any of your floors are uneven, fix them. If there are any pets around you, be aware of where they are. Review your medicines with your doctor. Some medicines can make you feel dizzy. This can increase your chance of falling. Ask your doctor what other things that you can do to help prevent falls. This information is not intended to replace advice given to you by your health care provider. Make sure you discuss any questions you have with your health care provider. Document Released: 10/12/2009 Document Revised: 05/23/2016 Document Reviewed: 01/20/2015 Elsevier Interactive Patient Education  2017 [employer].

## 2021-12-13 ENCOUNTER — Telehealth: Payer: Self-pay | Admitting: Internal Medicine

## 2021-12-13 DIAGNOSIS — E7800 Pure hypercholesterolemia, unspecified: Secondary | ICD-10-CM

## 2021-12-13 NOTE — Telephone Encounter (Signed)
Patient agreeable to CCM referral for repatha

## 2021-12-13 NOTE — Telephone Encounter (Signed)
Patient is taking  two lovastatin (MEVACOR) 10 MG tablet every other day. She would like Dr Lorin Picket to know that she can't take lovastatin. She would like to try another medication.

## 2021-12-13 NOTE — Telephone Encounter (Signed)
If tolerating, can continue one per day for now.  Has had intolerance to multiple statin medications.  If agreeable can refer to Catie - consider starting repatha (injectable cholesterol medication) etc.

## 2021-12-13 NOTE — Telephone Encounter (Signed)
Patient says that she does okay taking her lovastatin daily but we increased her to take 2 tablets qod and then 1 all other days. Patient is not tolerating. She says is makes her ache and like she doesn't have good strength in her muscles. Patient has went back to just taking one a day but wanted to know if this is ok or if you want to try her on a different medication. She has been intolerant to multiple statins.

## 2021-12-14 NOTE — Telephone Encounter (Signed)
CCM referral placed °

## 2021-12-14 NOTE — Addendum Note (Signed)
Addended by: Larry Sierras on: 12/14/2021 07:14 AM   Modules accepted: Orders

## 2021-12-17 ENCOUNTER — Telehealth: Payer: Self-pay

## 2021-12-17 NOTE — Chronic Care Management (AMB) (Signed)
Chronic Care Management   Note  12/17/2021 Name: Giavanni Zeitlin MRN: [id number] DOB: 1946/01/25  Sari Cogan is a 75 y.o. year old female who is a primary care patient of Einar Pheasant, MD. I reached out to [name] by phone today in response to a referral sent by Ms. Nani Ravens Westerman's PCP.  Ms. Birky was given information about Chronic Care Management services today including:  CCM service includes personalized support from designated clinical staff supervised by her physician, including individualized plan of care and coordination with other care providers 24/7 contact phone numbers for assistance for urgent and routine care needs. Service will only be billed when office clinical staff spend 20 minutes or more in a month to coordinate care. Only one practitioner may furnish and bill the service in a calendar month. The patient may stop CCM services at any time (effective at the end of the month) by phone call to the office staff. The patient is responsible for co-pay (up to 20% after annual deductible is met) if co-pay is required by the individual health plan.   Patient agreed to services and verbal consent obtained.   Follow up plan: Telephone appointment with care management team member scheduled for:01/08/2022  Noreene Larsson, [place], [place], [place] [address] Direct Dial: [phone] [email or web address]

## 2022-01-03 ENCOUNTER — Telehealth: Payer: Self-pay | Admitting: Internal Medicine

## 2022-01-03 NOTE — Telephone Encounter (Signed)
Patient called in requesting for Erica Little to call her @ [phone] Patient has several question

## 2022-01-04 NOTE — Telephone Encounter (Signed)
FYI   Called patient. She was calling to let me know that yesterday she was getting a little bit of yellow mucus out of her nose and was little bit tired. Confirmed no sob, chest tightness, cough, body aches, chills, fever, etc. Today she is having no symptoms. She has a home covid test that she is going to take just to be on the safe side. Patient is going to let me know if positive, monitor symptoms. Has appt with Catie next week and will let her know if sx persist and she needs work in appt

## 2022-01-04 NOTE — Telephone Encounter (Signed)
Reviewed.  Agree with testing and monitoring symptoms.  No symptoms today for note.  Call with update.  Call if problems.

## 2022-01-08 ENCOUNTER — Ambulatory Visit (INDEPENDENT_AMBULATORY_CARE_PROVIDER_SITE_OTHER): Payer: Medicare HMO | Admitting: Pharmacist

## 2022-01-08 DIAGNOSIS — M069 Rheumatoid arthritis, unspecified: Secondary | ICD-10-CM

## 2022-01-08 DIAGNOSIS — I700 Atherosclerosis of aorta: Secondary | ICD-10-CM

## 2022-01-08 DIAGNOSIS — M791 Myalgia, unspecified site: Secondary | ICD-10-CM

## 2022-01-08 DIAGNOSIS — T466X5A Adverse effect of antihyperlipidemic and antiarteriosclerotic drugs, initial encounter: Secondary | ICD-10-CM

## 2022-01-08 DIAGNOSIS — E7800 Pure hypercholesterolemia, unspecified: Secondary | ICD-10-CM

## 2022-01-08 DIAGNOSIS — I10 Essential (primary) hypertension: Secondary | ICD-10-CM

## 2022-01-08 MED ORDER — LOVASTATIN 10 MG PO TABS: 10.0000 mg | ORAL_TABLET | Freq: Every day | ORAL | 1 refills | Status: DC

## 2022-01-08 MED ORDER — PRALUENT 75 MG/ML ~~LOC~~ SOAJ: 75.0000 mg | SUBCUTANEOUS | 2 refills | Status: DC

## 2022-01-08 NOTE — Patient Instructions (Signed)
Visit Information   Following is a copy of your full care plan:  Care Plan : Medication Management  Updates made by De Hollingshead, RPH-CPP since 01/08/2022 12:00 AM     Problem: Hyperlipidemia, RA      Long-Range Goal: Disease Progression Prevention   Note:   Current Barriers:  Unable to achieve control of cholesterol   Pharmacist Clinical Goal(s):  patient will achieve control of cholesterol through collaboration with PharmD and provider.    Interventions: 1:1 collaboration with Einar Pheasant, MD regarding development and update of comprehensive plan of care as evidenced by provider attestation and co-signature Inter-disciplinary care team collaboration (see longitudinal plan of care) Comprehensive medication review performed; medication list updated in electronic medical record  Health Maintenance    Yearly influenza vaccination: due - declined Td/Tdap vaccination: up to date Pneumonia vaccination: up to date COVID vaccinations: due - declined Shingrix vaccinations: due - declined Colonoscopy: due - declined  Bone density scan: up to date Mammogram: up to date  Hypertension:   Controlled per last office reading; current treatment: telmisartan 40 mg, amlodipine 2.5 mg daily;  Recommended to continue current regimen at this time  Hyperlipidemia and Aortic Atherosclerosis with statin intolerance    Uncontrolled; current treatment: lovastatin 10 mg daily;  Medications previously tried: lovastatin alternating 10 and 20 mg daily, rosuvastatin 5 mg daily, pravastatin 10 mg daily - all resulted in joint pain and stiffness; ezetimibe 10 mg daily - also reports joint pain and stiffness  Educated on PCSK9i mechanism of action and role in therapy. Patient is on max tolerated statin therapy and LDL is not at goal. Recommend to start PCSK9i. Patient amenable. Per Cover My Meds, appears Praluent is preferred on the plan. PA completed for Praluent 75 mg every 14 days. Will follow  for result   Patient Goals/Self-Care Activities patient will:  - take medications as prescribed as evidenced by patient report and record review       Consent to CCM Services: Ms. Borak was given information about Chronic Care Management services including:  CCM service includes personalized support from designated clinical staff supervised by her physician, including individualized plan of care and coordination with other care providers 24/7 contact phone numbers for assistance for urgent and routine care needs. Service will only be billed when office clinical staff spend 20 minutes or more in a month to coordinate care. Only one practitioner may furnish and bill the service in a calendar month. The patient may stop CCM services at any time (effective at the end of the month) by phone call to the office staff. The patient will be responsible for cost sharing (co-pay) of up to 20% of the service fee (after annual deductible is met).  Patient agreed to services and verbal consent obtained.   Plan: Telephone follow up appointment with care management team member scheduled for:  pending medication access  Catie Darnelle Maffucci, PharmD, [place], CPP Clinical Pharmacist [place] at [facility] [phone]   Please call the care guide team at [phone] if you need to cancel or reschedule your appointment.   The patient verbalized understanding of instructions, educational materials, and care plan provided today and declined offer to receive copy of patient instructions, educational materials, and care plan.

## 2022-01-08 NOTE — Addendum Note (Signed)
Addended by: Lourena Simmonds on: 01/08/2022 05:08 PM   Modules accepted: Orders

## 2022-01-08 NOTE — Chronic Care Management (AMB) (Addendum)
Chronic Care Management CCM Pharmacy Note  01/08/2022 Name:  Erica Little MRN:  [id number] DOB:  1946-01-27  Summary: - Patient on max tolerated statin therapy and LDL not at goal. Discussed PCSK9i  Recommendations/Changes made from today's visit: - PA submitted for Praluent 75 mg today. Will follow for results   Subjective: Erica Little is an 76 y.o. year old female who is a primary patient of Dale Keene, MD.  The CCM team was consulted for assistance with disease management and care coordination needs.    Engaged with patient by telephone for initial visit for pharmacy case management and/or care coordination services.   Objective:  Medications Reviewed Today     Reviewed by Lourena Simmonds, RPh-CPP (Pharmacist) on 01/08/2022 at 1034 Med List Status: <None>   Medication Order Taking? Sig Documenting Provider Last Dose Status Informant  abatacept (ORENCIA) 250 MG injection [id number] No Once every month [provider] Taking Active   albuterol (PROVENTIL HFA;VENTOLIN HFA) 108 (90 BASE) MCG/ACT inhaler [id number] No Inhale 2 puffs into the lungs every 6 (six) hours as needed for wheezing or shortness of breath. Minna Antis, MD Taking Active   amLODipine (NORVASC) 2.5 MG tablet [id number] No TAKE 1 TABLET BY MOUTH EVERY DAY Dale Naranja, MD Taking Active   aspirin 81 MG chewable tablet [id number] No Chew 81 mg by mouth daily. [provider] Taking Active   calcium gluconate 650 MG tablet [id number] No Take 650 mg by mouth daily. [provider] Taking Active   carbamide peroxide (DEBROX) 6.5 % OTIC solution [id number] No 5 drops in right ear q day.  Massage for approximately 5 minutes. Dale Grafton, MD Taking Active   cholecalciferol (VITAMIN D) 1000 UNITS tablet [id number] No Take 1,000 Units by mouth daily. [provider] Taking Active   ELDERBERRY PO [id number] No Take by mouth daily. [provider] Taking Active    lovastatin (MEVACOR) 10 MG tablet [id number]  Take two tablets alternating with one tablet qod. Dale LaSalle, MD  Active   magnesium oxide (MAG-OX) 400 MG tablet [id number] No Take 400 mg by mouth daily. [provider] Taking Active            Med Note Idelia Salm May 08, 2015 10:50 AM) Received from: [facility] Received Sig:   methotrexate 50 MG/2ML injection [id number] No INJECT 0.2MLS SUBCUTANEOUSLY WEEKLY DISPENSE 2 VIALS [provider] Taking Active   pantoprazole (PROTONIX) 40 MG tablet [id number] No TAKE 1 TABLET BY MOUTH EVERY DAY Dale Greencastle, MD Taking Active   telmisartan (MICARDIS) 40 MG tablet [id number] No TAKE 1 TABLET BY MOUTH EVERY DAY Dale [place], MD Taking Active   Triamcinolone Acetonide (NASACORT AQ NA) [id number] No Place into the nose as needed. [provider] Taking Active   valACYclovir (VALTREX) 1000 MG tablet [id number] No TAKE 1 TABLET (1,000 MG TOTAL) BY MOUTH DAILY. Dale , MD Taking Active                Pertinent Labs:   Lab Results  Component Value Date   HGBA1C 5.8 11/26/2021   Lab Results  Component Value Date   CHOL 198 11/26/2021   HDL 57.70 11/26/2021   LDLCALC 120 (H) 11/26/2021   LDLDIRECT 167.8 12/20/2013   TRIG 101.0 11/26/2021   CHOLHDL 3 11/26/2021   Lab Results  Component Value Date   CREATININE 0.92 11/26/2021   BUN 10 11/26/2021   NA 133 (L) 11/26/2021  K 4.3 11/26/2021   CL 99 11/26/2021   CO2 25 11/26/2021    SDOH:  (Social Determinants of Health) assessments and interventions performed:  SDOH Interventions    Flowsheet Row Most Recent Value  SDOH Interventions   Financial Strain Interventions Other (Comment)  [grant funding app]       CCM Care Plan  Review of patient past medical history, allergies, medications, health status, including review of consultants reports, laboratory and other test data, was performed as part of comprehensive  evaluation and provision of chronic care management services.   Care Plan : Medication Management  Updates made by Lourena Simmondsravis, Jazariah Teall E, RPH-CPP since 01/08/2022 12:00 AM     Problem: Hyperlipidemia, RA      Long-Range Goal: Disease Progression Prevention   Note:   Current Barriers:  Unable to achieve control of cholesterol    Pharmacist Clinical Goal(s):  patient will achieve control of cholesterol through collaboration with PharmD and provider.    Interventions: 1:1 collaboration with Dale DurhamScott, Charlene, MD regarding development and update of comprehensive plan of care as evidenced by provider attestation and co-signature Inter-disciplinary care team collaboration (see longitudinal plan of care) Comprehensive medication review performed; medication list updated in electronic medical record   Health Maintenance    Yearly influenza vaccination: due - declined Td/Tdap vaccination: up to date Pneumonia vaccination: up to date COVID vaccinations: due - declined Shingrix vaccinations: due - declined Colonoscopy: due - declined  Bone density scan: up to date Mammogram: up to date   Hypertension:   Controlled per last office reading; current treatment: telmisartan 40 mg, amlodipine 2.5 mg daily;  Recommended to continue current regimen at this time   Hyperlipidemia and Aortic Atherosclerosis with statin intolerance    Uncontrolled; current treatment: lovastatin 10 mg daily;  Medications previously tried: lovastatin alternating 10 and 20 mg daily, rosuvastatin 5 mg daily, pravastatin 10 mg daily - all resulted in joint pain and stiffness; ezetimibe 10 mg daily - also reports joint pain and stiffness  Educated on PCSK9i mechanism of action and role in therapy. Patient is on max tolerated statin therapy and LDL is not at goal. Recommend to start PCSK9i. Patient amenable. Per Cover My Meds, appears Praluent is preferred on the plan. PA completed for Praluent 75 mg every 14 days. Approved.  [facility] Kennedy BuckerGrantJohnney Killian: BI: [id number]; PCNJudie Petit: [id number]; Group: [id number]; ID: [id number]; 1/10-12/09/2022. Will call [facility] tomorrow to ensure this is run appropriately.    Patient Goals/Self-Care Activities patient will:  - take medications as prescribed as evidenced by patient report and record review      Plan: Telephone follow up appointment with care management team member scheduled for:  pending medication access  Catie Feliz Beamravis, PharmD, [place], CPP Clinical Pharmacist [facility] [phone]

## 2022-01-09 ENCOUNTER — Ambulatory Visit: Payer: Medicare HMO | Admitting: Pharmacist

## 2022-01-09 DIAGNOSIS — E7800 Pure hypercholesterolemia, unspecified: Secondary | ICD-10-CM

## 2022-01-09 DIAGNOSIS — M791 Myalgia, unspecified site: Secondary | ICD-10-CM

## 2022-01-09 DIAGNOSIS — T466X5A Adverse effect of antihyperlipidemic and antiarteriosclerotic drugs, initial encounter: Secondary | ICD-10-CM

## 2022-01-09 DIAGNOSIS — I700 Atherosclerosis of aorta: Secondary | ICD-10-CM

## 2022-01-09 NOTE — Patient Instructions (Signed)
Visit Information  Following are the goals we discussed today:  Patient Goals/Self-Care Activities patient will:  - take medications as prescribed as evidenced by patient report and record review        Plan: Telephone follow up appointment with care management team member scheduled for:  6 weeks   Catie Feliz Beam, PharmD, [place], CPP Clinical Pharmacist [facility] at [facility] [phone]     Please call the care guide team at [phone] if you need to cancel or reschedule your appointment.   The patient verbalized understanding of instructions, educational materials, and care plan provided today and declined offer to receive copy of patient instructions, educational materials, and care plan.

## 2022-01-09 NOTE — Chronic Care Management (AMB) (Signed)
Chronic Care Management CCM Pharmacy Note  01/09/2022 Name:  Hilda Rynders MRN:  [id number] DOB:  [birth year]  Summary: - [employer] approved. Called pharmacy, they are re-running the prescription with this information  Recommendations/Changes made from today's visit: - Start Praluent 75 mg every 14 days. Patient verbalizes understanding  Subjective: Shelsey Rieth is an 76 y.o. year old female who is a primary patient of Dale [place], MD.  The CCM team was consulted for assistance with disease management and care coordination needs.    Engaged with patient by telephone for follow up visit for pharmacy case management and/or care coordination services.   Objective:  Medications Reviewed Today     Reviewed by Lourena Simmonds, RPH-CPP (Pharmacist) on 01/08/22 at 1705  Med List Status: <None>   Medication Order Taking? Sig Documenting Provider Last Dose Status Informant  abatacept (ORENCIA) 250 MG injection [id number] Yes Once every month [provider] Taking Active   albuterol (PROVENTIL HFA;VENTOLIN HFA) 108 (90 BASE) MCG/ACT inhaler [id number] No Inhale 2 puffs into the lungs every 6 (six) hours as needed for wheezing or shortness of breath.  Patient not taking: Reported on 01/08/2022   Minna Antis, MD Not Taking Active   Alirocumab (PRALUENT) 75 MG/ML Ivory Broad [id number] Yes Inject 75 mg into the skin every 14 (fourteen) days. Dale [place], MD  Active   amLODipine (NORVASC) 2.5 MG tablet [id number] Yes TAKE 1 TABLET BY MOUTH EVERY DAY Dale [place], MD Taking Active   aspirin 81 MG chewable tablet [id number] Yes Chew 81 mg by mouth daily. [provider] Taking Active   calcium gluconate 650 MG tablet [id number] Yes Take 650 mg by mouth daily. [provider] Taking Active   carbamide peroxide (DEBROX) 6.5 % OTIC solution [id number] Yes 5 drops in right ear q day.  Massage for approximately 5 minutes. Dale [place], MD Taking  Active   cholecalciferol (VITAMIN D) 1000 UNITS tablet [id number] Yes Take 1,000 Units by mouth daily. [provider] Taking Active   ELDERBERRY PO [id number] Yes Take by mouth daily. [provider] Taking Active   lovastatin (MEVACOR) 10 MG tablet [id number]  Take 1 tablet (10 mg total) by mouth at bedtime. Dale [place], MD  Active   magnesium oxide (MAG-OX) 400 MG tablet [id number] Yes Take 400 mg by mouth daily. [provider] Taking Active            Med Note Guinevere Scarlet Jan 08, 2022  9:48 AM)    methotrexate 50 MG/2ML injection [id number] Yes INJECT 0.2MLS SUBCUTANEOUSLY WEEKLY DISPENSE 2 VIALS [provider] Taking Active   pantoprazole (PROTONIX) 40 MG tablet [id number] Yes TAKE 1 TABLET BY MOUTH EVERY DAY Dale [place], MD Taking Active   telmisartan (MICARDIS) 40 MG tablet [id number] Yes TAKE 1 TABLET BY MOUTH EVERY DAY Dale [place], MD Taking Active   Triamcinolone Acetonide (NASACORT AQ NA) [id number] Yes Place into the nose as needed. [provider] Taking Active   valACYclovir (VALTREX) 1000 MG tablet [id number] Yes TAKE 1 TABLET (1,000 MG TOTAL) BY MOUTH DAILY. Dale [place], MD Taking Active             Pertinent Labs:   Lab Results  Component Value Date   HGBA1C 5.8 11/26/2021   Lab Results  Component Value Date   CHOL 198 11/26/2021   HDL 57.70 11/26/2021   LDLCALC 120 (H) 11/26/2021   LDLDIRECT 167.8 12/20/2013   TRIG 101.0 11/26/2021  CHOLHDL 3 11/26/2021   Lab Results  Component Value Date   CREATININE 0.92 11/26/2021   BUN 10 11/26/2021   NA 133 (L) 11/26/2021   K 4.3 11/26/2021   CL 99 11/26/2021   CO2 25 11/26/2021    SDOH:  (Social Determinants of Health) assessments and interventions performed:  SDOH Interventions    Flowsheet Row Most Recent Value  SDOH Interventions   Financial Strain Interventions Other (Comment)  [manufacturer assistance]       CCM Care Plan  Review of  patient past medical history, allergies, medications, health status, including review of consultants reports, laboratory and other test data, was performed as part of comprehensive evaluation and provision of chronic care management services.   Care Plan : Medication Management  Updates made by Lourena Simmondsravis, Sunday Klos E, RPH-CPP since 01/09/2022 12:00 AM     Problem: Hyperlipidemia, RA      Long-Range Goal: Disease Progression Prevention   Note:   Current Barriers:  Unable to achieve control of cholesterol    Pharmacist Clinical Goal(s):  patient will achieve control of cholesterol through collaboration with PharmD and provider.    Interventions: 1:1 collaboration with Dale DurhamScott, Charlene, MD regarding development and update of comprehensive plan of care as evidenced by provider attestation and co-signature Inter-disciplinary care team collaboration (see longitudinal plan of care) Comprehensive medication review performed; medication list updated in electronic medical record   Health Maintenance    Yearly influenza vaccination: due - declined Td/Tdap vaccination: up to date Pneumonia vaccination: up to date COVID vaccinations: due - declined Shingrix vaccinations: due - declined Colonoscopy: due - declined  Bone density scan: up to date Mammogram: up to date   Hypertension:   Controlled per last office reading; current treatment: telmisartan 40 mg, amlodipine 2.5 mg daily;  Recommended to continue current regimen at this time   Hyperlipidemia and Aortic Atherosclerosis with statin intolerance    Uncontrolled; current treatment: lovastatin 10 mg daily;  Medications previously tried: lovastatin alternating 10 and 20 mg daily, rosuvastatin 5 mg daily, pravastatin 10 mg daily - all resulted in joint pain and stiffness; ezetimibe 10 mg daily - also reports joint pain and stiffness  [employer] GrantJohnney Killian: BI: [id number]: [id number]; PCNJudie Petit: [id number]; Group: [id number]; ID: [id number]; 1/10-12/09/2022 Called  pharmacy, confirmed they ordered and they will run [employer] information. Counseled patient on injection technique, her friend will help with her with injection. Provided website information for videos. She declines needing a face to face appointment for injection demonstration, but reports she will call me if she has questions or concerns.    Patient Goals/Self-Care Activities patient will:  - take medications as prescribed as evidenced by patient report and record review      Plan: Telephone follow up appointment with care management team member scheduled for:  6 weeks  Catie Feliz Beamravis, PharmD, [place] LakeBCACP, CPP Clinical Pharmacist [facility] [place] [phone]

## 2022-01-29 DIAGNOSIS — I10 Essential (primary) hypertension: Secondary | ICD-10-CM | POA: Diagnosis not present

## 2022-01-29 DIAGNOSIS — I700 Atherosclerosis of aorta: Secondary | ICD-10-CM

## 2022-01-29 DIAGNOSIS — M069 Rheumatoid arthritis, unspecified: Secondary | ICD-10-CM

## 2022-01-29 DIAGNOSIS — E785 Hyperlipidemia, unspecified: Secondary | ICD-10-CM | POA: Diagnosis not present

## 2022-02-19 ENCOUNTER — Ambulatory Visit (INDEPENDENT_AMBULATORY_CARE_PROVIDER_SITE_OTHER): Payer: Medicare HMO | Admitting: Pharmacist

## 2022-02-19 DIAGNOSIS — E7800 Pure hypercholesterolemia, unspecified: Secondary | ICD-10-CM

## 2022-02-19 DIAGNOSIS — I10 Essential (primary) hypertension: Secondary | ICD-10-CM

## 2022-02-19 DIAGNOSIS — M069 Rheumatoid arthritis, unspecified: Secondary | ICD-10-CM

## 2022-02-19 NOTE — Patient Instructions (Signed)
Desyree,   Keep up the great work!  We'll see how your cholesterol looks at your next appointment.   I recommend you check your blood pressure periodically at home, write down the reading, and bring with you when you see Dr. Nicki Reaper next. Our goal is less than 130/80 for most patients.   Take care!  Catie Darnelle Maffucci, PharmD  Visit Information  Following are the goals we discussed today:  Patient Goals/Self-Care Activities patient will:  - take medications as prescribed as evidenced by patient report and record review        Plan: Telephone follow up appointment with care management team member scheduled for:  pending lab results   Catie Darnelle Maffucci, PharmD, BCACP, CPP Clinical Pharmacist [place] [facility] [phone]   Please call the care guide team at [phone] if you need to cancel or reschedule your appointment.   The patient verbalized understanding of instructions, educational materials, and care plan provided today and agreed to receive a mailed copy of patient instructions, educational materials, and care plan.

## 2022-02-19 NOTE — Chronic Care Management (AMB) (Signed)
Chronic Care Management CCM Pharmacy Note  02/19/2022 Name:  Erica Little MRN:  [id number] DOB:  1946/06/22  Summary: - Tolerating regimen  Recommendations/Changes made from today's visit: - Continue current regimen  Subjective: Erica Little is an 76 y.o. year old female who is a primary patient of Dale [place], MD.  The CCM team was consulted for assistance with disease management and care coordination needs.    Engaged with patient by telephone for follow up visit for pharmacy case management and/or care coordination services.   Objective:  Medications Reviewed Today     Reviewed by Lourena Simmonds, RPH-CPP (Pharmacist) on 02/19/22 at [phone]  Med List Status: <None>   Medication Order Taking? Sig Documenting Provider Last Dose Status Informant  abatacept (ORENCIA) 250 MG injection [id number] Yes Once every month [provider] Taking Active   albuterol (PROVENTIL HFA;VENTOLIN HFA) 108 (90 BASE) MCG/ACT inhaler [id number]  Inhale 2 puffs into the lungs every 6 (six) hours as needed for wheezing or shortness of breath.  Patient not taking: Reported on 01/08/2022   Minna Antis, MD  Active   Alirocumab (PRALUENT) 75 MG/ML Ivory Broad [id number] Yes Inject 75 mg into the skin every 14 (fourteen) days. Dale [place], MD Taking Active   amLODipine (NORVASC) 2.5 MG tablet [id number] Yes TAKE 1 TABLET BY MOUTH EVERY DAY Dale [place], MD Taking Active   aspirin 81 MG chewable tablet [id number] Yes Chew 81 mg by mouth daily. [provider] Taking Active   calcium gluconate 650 MG tablet [id number] Yes Take 650 mg by mouth daily. [provider] Taking Active   carbamide peroxide (DEBROX) 6.5 % OTIC solution [id number]  5 drops in right ear q day.  Massage for approximately 5 minutes. Dale [place], MD  Active   cholecalciferol (VITAMIN D) 1000 UNITS tablet [id number] Yes Take 1,000 Units by mouth daily. [provider] Taking Active    ELDERBERRY PO [id number] Yes Take by mouth daily. [provider] Taking Active   folic acid (FOLVITE) 1 MG tablet [id number] Yes Take 1 mg by mouth daily. (Except MTX days) [provider] Taking Active   lovastatin (MEVACOR) 10 MG tablet [id number] Yes Take 1 tablet (10 mg total) by mouth at bedtime. Dale [place], MD Taking Active   magnesium oxide (MAG-OX) 400 MG tablet [id number]  Take 400 mg by mouth daily. [provider]  Active            Med Note Guinevere Scarlet Jan 08, 2022  9:48 AM)    methotrexate 50 MG/2ML injection [id number] Yes INJECT 0.2MLS SUBCUTANEOUSLY WEEKLY DISPENSE 2 VIALS [provider] Taking Active   pantoprazole (PROTONIX) 40 MG tablet [id number] Yes TAKE 1 TABLET BY MOUTH EVERY DAY Dale [place], MD Taking Active   telmisartan (MICARDIS) 40 MG tablet [id number] Yes TAKE 1 TABLET BY MOUTH EVERY DAY Dale [place], MD Taking Active   Triamcinolone Acetonide (NASACORT AQ NA) [id number]  Place into the nose as needed. [provider]  Active   valACYclovir (VALTREX) 1000 MG tablet [id number]  TAKE 1 TABLET (1,000 MG TOTAL) BY MOUTH DAILY. Dale [place], MD  Active             Pertinent Labs:   Lab Results  Component Value Date   HGBA1C 5.8 11/26/2021   Lab Results  Component Value Date   CHOL 198 11/26/2021   HDL 57.70 11/26/2021   LDLCALC 120 (H) 11/26/2021   LDLDIRECT 167.8 12/20/2013  TRIG 101.0 11/26/2021   CHOLHDL 3 11/26/2021   Lab Results  Component Value Date   CREATININE 0.92 11/26/2021   BUN 10 11/26/2021   NA 133 (L) 11/26/2021   K 4.3 11/26/2021   CL 99 11/26/2021   CO2 25 11/26/2021    SDOH:  (Social Determinants of Health) assessments and interventions performed:  SDOH Interventions    Flowsheet Row Most Recent Value  SDOH Interventions   Financial Strain Interventions Other (Comment)  [disease foundation funding]       CCM Care Plan  Review of patient past medical  history, allergies, medications, health status, including review of consultants reports, laboratory and other test data, was performed as part of comprehensive evaluation and provision of chronic care management services.   Care Plan : Medication Management  Updates made by Lourena Simmondsravis, Walaa Carel E, RPH-CPP since 02/19/2022 12:00 AM     Problem: Hyperlipidemia, RA      Long-Range Goal: Disease Progression Prevention   Note:   Current Barriers:  Unable to achieve control of cholesterol    Pharmacist Clinical Goal(s):  patient will achieve control of cholesterol through collaboration with PharmD and provider.    Interventions: 1:1 collaboration with Dale DurhamScott, Charlene, MD regarding development and update of comprehensive plan of care as evidenced by provider attestation and co-signature Inter-disciplinary care team collaboration (see longitudinal plan of care) Comprehensive medication review performed; medication list updated in electronic medical record   Health Maintenance    Yearly influenza vaccination: due - declined Td/Tdap vaccination: up to date Pneumonia vaccination: up to date COVID vaccinations: due - declined Shingrix vaccinations: due - declined Colonoscopy: due - declined  Bone density scan: up to date Mammogram: up to date   Hypertension:   [facility]; current treatment: telmisartan 40 mg, amlodipine 2.5 mg daily;  Home BP readings: not checking, but has a cuff. Reports she can tell when her BP is high. Reports some anxiety at provider appointments Discussed benefit of home BP checks to evaluate control. Encouraged to check periodically between now and PCP appointment Recommended to continue current regimen at this time   Hyperlipidemia and Aortic Atherosclerosis with statin intolerance    Uncontrolled but anticipated to be improved; current treatment: lovastatin 10 mg daily; Praluent 75 mg Q14 days Denies intolerability  concerns with Praluent  Medications previously tried: lovastatin alternating 10 and 20 mg daily, rosuvastatin 5 mg daily, pravastatin 10 mg daily - all resulted in joint pain and stiffness; ezetimibe 10 mg daily - also reports joint pain and stiffness  Approved for [employer] GrantJohnney Killian: BI: [id number]; PCNJudie Petit: [id number]; Group: [id number]; ID: [id number]; 1/10-12/09/2022 Recommended to continue current regimen at this time. Labs ordered.    Patient Goals/Self-Care Activities patient will:  - take medications as prescribed as evidenced by patient report and record review      Plan: Telephone follow up appointment with care management team member scheduled for:  pending lab results  Catie Feliz Beamravis, PharmD, [place]BCACP, CPP Clinical Pharmacist [facility] [phone]

## 2022-02-21 ENCOUNTER — Other Ambulatory Visit: Payer: Self-pay | Admitting: Internal Medicine

## 2022-02-22 ENCOUNTER — Telehealth: Payer: Medicare HMO

## 2022-02-26 DIAGNOSIS — I10 Essential (primary) hypertension: Secondary | ICD-10-CM

## 2022-02-26 DIAGNOSIS — M069 Rheumatoid arthritis, unspecified: Secondary | ICD-10-CM

## 2022-02-26 DIAGNOSIS — E7800 Pure hypercholesterolemia, unspecified: Secondary | ICD-10-CM

## 2022-03-07 ENCOUNTER — Ambulatory Visit: Payer: Self-pay | Admitting: Pharmacist

## 2022-03-07 NOTE — Chronic Care Management (AMB) (Signed)
?  Chronic Care Management  ? ?Note ? ?03/07/2022 ?Name: Huxley Maracle MRN: [id number] DOB: [birth year] ? ? ? ?Closing pharmacy CCM case at this time.  Patient has clinic contact information for future questions or concerns.  ? ?Catie Feliz Beam, PharmD, [place], CPP ?Clinical Pharmacist ?Nature conservation officer at [employer] ?[phone] ? ?

## 2022-04-09 ENCOUNTER — Other Ambulatory Visit (INDEPENDENT_AMBULATORY_CARE_PROVIDER_SITE_OTHER): Payer: Medicare HMO

## 2022-04-09 ENCOUNTER — Other Ambulatory Visit: Payer: Medicare HMO

## 2022-04-09 DIAGNOSIS — E7800 Pure hypercholesterolemia, unspecified: Secondary | ICD-10-CM | POA: Diagnosis not present

## 2022-04-09 DIAGNOSIS — I10 Essential (primary) hypertension: Secondary | ICD-10-CM

## 2022-04-09 LAB — COMPREHENSIVE METABOLIC PANEL
ALT: 11 U/L (ref 0–35)
AST: 19 U/L (ref 0–37)
Albumin: 4.6 g/dL (ref 3.5–5.2)
Alkaline Phosphatase: 84 U/L (ref 39–117)
BUN: 11 mg/dL (ref 6–23)
CO2: 26 mEq/L (ref 19–32)
Calcium: 9.5 mg/dL (ref 8.4–10.5)
Chloride: 99 mEq/L (ref 96–112)
Creatinine, Ser: 0.83 mg/dL (ref 0.40–1.20)
GFR: 68.06 mL/min (ref >60.00)
Glucose, Bld: 79 mg/dL (ref 70–99)
Potassium: 4.3 mEq/L (ref 3.5–5.1)
Sodium: 133 mEq/L — ABNORMAL LOW (ref 135–145)
Total Bilirubin: 0.9 mg/dL (ref 0.2–1.2)
Total Protein: 6.5 g/dL (ref 6.0–8.3)

## 2022-04-09 LAB — LIPID PANEL
Cholesterol: 170 mg/dL (ref 0–200)
HDL: 62.3 mg/dL (ref >39.00)
LDL Cholesterol: 86 mg/dL (ref 0–99)
NonHDL: 108.07
Total CHOL/HDL Ratio: 3
Triglycerides: 108 mg/dL (ref 0.0–149.0)
VLDL: 21.6 mg/dL (ref 0.0–40.0)

## 2022-04-11 ENCOUNTER — Encounter: Payer: Self-pay | Admitting: Internal Medicine

## 2022-04-11 ENCOUNTER — Ambulatory Visit (INDEPENDENT_AMBULATORY_CARE_PROVIDER_SITE_OTHER): Payer: Medicare HMO | Admitting: Internal Medicine

## 2022-04-11 VITALS — BP 116/70 | HR 65 | Temp 96.1°F | Resp 17 | Ht 62.0 in | Wt 102.0 lb

## 2022-04-11 DIAGNOSIS — Z0000 Encounter for general adult medical examination without abnormal findings: Secondary | ICD-10-CM | POA: Diagnosis not present

## 2022-04-11 DIAGNOSIS — J449 Chronic obstructive pulmonary disease, unspecified: Secondary | ICD-10-CM

## 2022-04-11 DIAGNOSIS — I6523 Occlusion and stenosis of bilateral carotid arteries: Secondary | ICD-10-CM

## 2022-04-11 DIAGNOSIS — I700 Atherosclerosis of aorta: Secondary | ICD-10-CM

## 2022-04-11 DIAGNOSIS — E7800 Pure hypercholesterolemia, unspecified: Secondary | ICD-10-CM

## 2022-04-11 DIAGNOSIS — R928 Other abnormal and inconclusive findings on diagnostic imaging of breast: Secondary | ICD-10-CM

## 2022-04-11 DIAGNOSIS — J4489 Other specified chronic obstructive pulmonary disease: Secondary | ICD-10-CM

## 2022-04-11 DIAGNOSIS — I10 Essential (primary) hypertension: Secondary | ICD-10-CM

## 2022-04-11 DIAGNOSIS — Z1231 Encounter for screening mammogram for malignant neoplasm of breast: Secondary | ICD-10-CM

## 2022-04-11 DIAGNOSIS — R739 Hyperglycemia, unspecified: Secondary | ICD-10-CM

## 2022-04-11 DIAGNOSIS — M069 Rheumatoid arthritis, unspecified: Secondary | ICD-10-CM

## 2022-04-11 DIAGNOSIS — R918 Other nonspecific abnormal finding of lung field: Secondary | ICD-10-CM

## 2022-04-11 DIAGNOSIS — R634 Abnormal weight loss: Secondary | ICD-10-CM

## 2022-04-11 DIAGNOSIS — E871 Hypo-osmolality and hyponatremia: Secondary | ICD-10-CM

## 2022-04-11 NOTE — Assessment & Plan Note (Addendum)
Physical today 04/11/22.  Mammogram 08/14/21 - [facility] 0.  Recommended f/u left breast mammogram.  Left breast mammogram and ultrasound 08/29/21 - Bir[facility] I.  Colonoscopy 2015.  Recommended f/u 2020.  Do not see where she has had f/u colonoscopy.  Need to schedule if agreeable.  Phone message.  ?

## 2022-04-11 NOTE — Progress Notes (Signed)
Patient ID: Erica Little, female   DOB: 11-27-1946, 76 y.o.   MRN: [id number] ? ? ?Subjective:  ? ? Patient ID: Erica Little, female    DOB: 1946-02-13, 76 y.o.   MRN: [id number] ? ?This visit occurred during the SARS-CoV-2 public health emergency.  Safety protocols were in place, including screening questions prior to the visit, additional usage of staff PPE, and extensive cleaning of exam room while observing appropriate contact time as indicated for disinfecting solutions.  ? ?Patient here for her physical exam.  ? ?Chief Complaint  ?Patient presents with  ? Follow-up  ?  CPE   ? .  ? ?HPI ?Seeing rheumatology for f/u and treatment of RA.  On orencia and MTX.  Stable.  Overall feels good.  No chest pain or sob reported.  No abdominal pain or bowel change reported.  Reviewed outside blood pressure readings - 106-120/60s.   ? ? ?Past Medical History:  ?Diagnosis Date  ? Family history of adverse reaction to anesthesia   ? Mother - PONV  ? Family history of brain aneurysm   ? GERD (gastroesophageal reflux disease)   ? Hyperlipidemia   ? Hypertension   ? MRSA (methicillin resistant Staphylococcus aureus)   ? skin infections  ? Rheumatoid arthritis(714.0)   ? transaminitis on MTX, remicade rxn, s/p sulfasalazine, orencia  ? Ulcer disease   ? ?Past Surgical History:  ?Procedure Laterality Date  ? CATARACT EXTRACTION W/PHACO Left 01/22/2021  ? Procedure: CATARACT EXTRACTION PHACO AND INTRAOCULAR LENS PLACEMENT (IOC) LEFT;  Surgeon: Nevada Crane, MD;  Location[facility];  Service: Ophthalmology;  Laterality: Left;  5.90 ?0:37.7  ? CATARACT EXTRACTION W/PHACO Right 02/05/2021  ? Procedure: CATARACT EXTRACTION PHACO AND INTRAOCULAR LENS PLACEMENT (IOC) RIGHT 4.33 00:34.6;  Surgeon: Nevada Crane, MD;  Location: [facility];  Service: Ophthalmology;  Laterality: Right;  ? CHOLECYSTECTOMY  2010  ? INTRACRANIAL ANEURYSM REPAIR  05/29/2016  ? MCP replacements  2001  ? left  ? TUBAL LIGATION     ? ?Family History  ?Problem Relation Age of Onset  ? Heart disease Mother   ? Diabetes Mother   ? Hypercholesterolemia Mother   ? Heart disease Father   ? Heart disease Brother   ?     MI age 25  ? Hypercholesterolemia Sister   ? Cancer Sister   ? Colon polyps Sister   ? Arthritis/Rheumatoid Paternal Aunt   ? Arthritis/Rheumatoid Paternal Uncle   ? Breast cancer Neg Hx   ? ?Social History  ? ?Socioeconomic History  ? Marital status: Widowed  ?  Spouse name: Not on file  ? Number of children: 1  ? Years of education: Not on file  ? Highest education level: Not on file  ?Occupational History  ? Not on file  ?Tobacco Use  ? Smoking status: Former  ?  Types: Cigarettes  ?  Quit date: 12/30/1988  ?  Years since quitting: 33.3  ? Smokeless tobacco: Never  ?Vaping Use  ? Vaping Use: Never used  ?Substance and Sexual Activity  ? Alcohol use: No  ?  Alcohol/week: 0.0 standard drinks  ? Drug use: No  ? Sexual activity: Yes  ?Other Topics Concern  ? Not on file  ?Social History Narrative  ? Enjoys dancing   ? ?Social Determinants of Health  ? ?Financial Resource Strain: Low Risk   ? Difficulty of Paying Living Expenses: Not very hard  ?Food Insecurity: No Food Insecurity  ? Worried  About Running Out of Food in the Last Year: Never true  ? Ran Out of Food in the Last Year: Never true  ?Transportation Needs: No Transportation Needs  ? Lack of Transportation (Medical): No  ? Lack of Transportation (Non-Medical): No  ?Physical Activity: Sufficiently Active  ? Days of Exercise per Week: 5 days  ? Minutes of Exercise per Session: 30 min  ?Stress: No Stress Concern Present  ? Feeling of Stress : Not at all  ?Social Connections: Unknown  ? Frequency of Communication with Friends and Family: More than three times a week  ? Frequency of Social Gatherings with Friends and Family: More than three times a week  ? Attends Religious Services: Not on file  ? Active Member of Clubs or Organizations: Not on file  ? Attends Banker  Meetings: Not on file  ? Marital Status: Widowed  ? ? ? ?Review of Systems  ?Constitutional:  Negative for appetite change and unexpected weight change.  ?HENT:  Negative for congestion, sinus pressure and sore throat.   ?Eyes:  Negative for pain and visual disturbance.  ?Respiratory:  Negative for cough, chest tightness and shortness of breath.   ?Cardiovascular:  Negative for chest pain and palpitations.  ?Gastrointestinal:  Negative for abdominal pain, diarrhea, nausea and vomiting.  ?Genitourinary:  Negative for difficulty urinating and dysuria.  ?Musculoskeletal:  Negative for myalgias.  ?     Followed by rheumatology for joint issues as outlined.   ?Skin:  Negative for color change and rash.  ?Neurological:  Negative for dizziness, light-headedness and headaches.  ?Hematological:  Negative for adenopathy. Does not bruise/bleed easily.  ?Psychiatric/Behavioral:  Negative for agitation and dysphoric mood.   ? ?   ?Objective:  ?  ? ?BP 116/70 (BP Location: Left Arm, Patient Position: Sitting, Cuff Size: Small)   Pulse 65   Temp (!) 96.1 ?F (35.6 ?C) (Temporal)   Resp 17   Ht  (1.575 m)   Wt 102 lb (46.3 kg)   SpO2 97%   BMI 18.66 kg/m?  ?Wt Readings from Last 3 Encounters:  ?04/11/22 102 lb (46.3 kg)  ?11/29/21 103 lb (46.7 kg)  ?11/08/21 103 lb 3.2 oz (46.8 kg)  ? ? ?Physical Exam ?Vitals reviewed.  ?Constitutional:   ?   General: She is not in acute distress. ?   Appearance: Normal appearance. She is well-developed.  ?HENT:  ?   Head: Normocephalic and atraumatic.  ?   Right Ear: External ear normal.  ?   Left Ear: External ear normal.  ?Eyes:  ?   General: No scleral icterus.    ?   Right eye: No discharge.     ?   Left eye: No discharge.  ?   Conjunctiva/sclera: Conjunctivae normal.  ?Neck:  ?   Thyroid: No thyromegaly.  ?Cardiovascular:  ?   Rate and Rhythm: Normal rate and regular rhythm.  ?Pulmonary:  ?   Effort: No tachypnea, accessory muscle usage or respiratory distress.  ?   Breath sounds:  Normal breath sounds. No decreased breath sounds or wheezing.  ?Chest:  ?Breasts: ?   Right: No inverted nipple, mass, nipple discharge or tenderness (no axillary adenopathy).  ?   Left: No inverted nipple, mass, nipple discharge or tenderness (no axilarry adenopathy).  ?Abdominal:  ?   General: Bowel sounds are normal.  ?   Palpations: Abdomen is soft.  ?   Tenderness: There is no abdominal tenderness.  ?Musculoskeletal:     ?  General: No swelling or tenderness.  ?   Cervical back: Neck supple.  ?Lymphadenopathy:  ?   Cervical: No cervical adenopathy.  ?Skin: ?   Findings: No erythema or rash.  ?Neurological:  ?   Mental Status: She is alert and oriented to person, place, and time.  ?Psychiatric:     ?   Mood and Affect: Mood normal.     ?   Behavior: Behavior normal.  ? ? ? ?Outpatient Encounter Medications as of 04/11/2022  ?Medication Sig  ? abatacept (ORENCIA) 250 MG injection Once every month  ? amLODipine (NORVASC) 2.5 MG tablet TAKE 1 TABLET BY MOUTH EVERY DAY  ? aspirin 81 MG chewable tablet Chew 81 mg by mouth daily.  ? calcium gluconate 650 MG tablet Take 650 mg by mouth daily.  ? carbamide peroxide (DEBROX) 6.5 % OTIC solution 5 drops in right ear q day.  Massage for approximately 5 minutes.  ? cholecalciferol (VITAMIN D) 1000 UNITS tablet Take 1,000 Units by mouth daily.  ? ELDERBERRY PO Take by mouth daily.  ? folic acid (FOLVITE) 1 MG tablet Take 1 mg by mouth daily. (Except MTX days)  ? lovastatin (MEVACOR) 10 MG tablet Take 1 tablet (10 mg total) by mouth at bedtime.  ? magnesium oxide (MAG-OX) 400 MG tablet Take 400 mg by mouth daily.  ? methotrexate 50 MG/2ML injection INJECT 0.2MLS SUBCUTANEOUSLY WEEKLY DISPENSE 2 VIALS  ? pantoprazole (PROTONIX) 40 MG tablet TAKE 1 TABLET BY MOUTH EVERY DAY  ? PRALUENT 75 MG/ML SOAJ INJECT 75 MG INTO THE SKIN EVERY 14 DAYS. *PLEASE PUT PT ASSISTANCE (PDMI BIN) ON AS SECONDARY*  ? telmisartan (MICARDIS) 40 MG tablet TAKE 1 TABLET BY MOUTH EVERY DAY  ?  Triamcinolone Acetonide (NASACORT AQ NA) Place into the nose as needed.  ? valACYclovir (VALTREX) 1000 MG tablet TAKE 1 TABLET BY MOUTH EVERY DAY  ? [DISCONTINUED] albuterol (PROVENTIL HFA;VENTOLIN HFA) 108 (90 BASE)

## 2022-04-14 ENCOUNTER — Encounter: Payer: Self-pay | Admitting: Internal Medicine

## 2022-04-14 NOTE — Assessment & Plan Note (Signed)
Has seen Dr Gonzalez.  Breathing stable.  Follow.  

## 2022-04-14 NOTE — Assessment & Plan Note (Signed)
On MTX and orencia.  Doing well.  Followed by rheumatology.  

## 2022-04-14 NOTE — Assessment & Plan Note (Signed)
Continue lovastatin 

## 2022-04-14 NOTE — Assessment & Plan Note (Signed)
Follow met b and a1c.  ?

## 2022-04-14 NOTE — Assessment & Plan Note (Signed)
Carotid ultrasound 10/2021- Dr Schnier - recommended f/u carotid ultrasound in 2 years.  

## 2022-04-14 NOTE — Assessment & Plan Note (Signed)
On lovastatin.  Follow lipid panel and liver function tests.   

## 2022-04-14 NOTE — Assessment & Plan Note (Signed)
Continue micardis and presumed to still be on amlodipine.  Confirm medication.  Outside blood pressures look good.  Continue to spot check pressures.  Follow metabolic panel.  ?

## 2022-04-14 NOTE — Assessment & Plan Note (Signed)
Has seen nephrology.  Stable.  Follow metabolic panel.   

## 2022-04-14 NOTE — Assessment & Plan Note (Signed)
Followed by pulmonary.  Had f/u CT scan 10/2020.   Stable.  Recommended f/u in 2 years.   

## 2022-05-28 ENCOUNTER — Telehealth: Payer: Self-pay | Admitting: Internal Medicine

## 2022-05-28 NOTE — Telephone Encounter (Signed)
Pt started Praluent - ankles are now swelling and having fatigue. Right more than Left.  Hurts to stand .  Due for next shot next week. Asking if she can discontinue Praluent and schedule follow up to discuss with you.

## 2022-05-28 NOTE — Telephone Encounter (Signed)
Pt called in stating that Dr. Nicki Reaper prescribe medication (PRALUENT 63 MG/ML SOAJ)... Pt stated that she is having some side effect that Dr. Nicki Reaper already know about... Pt stated that her ankle and chin is swallowing up.... Pt requesting callback.Marland KitchenMarland Kitchen

## 2022-05-28 NOTE — Telephone Encounter (Signed)
Pt sched for 6/1 at 10:30am

## 2022-05-28 NOTE — Telephone Encounter (Signed)
Yes.  She can stop praluent, but need to clarify her symptoms.  Initial note mentioned "chin" swelling.  If any lip, tongue, face swelling, needs to be seen urgently.

## 2022-05-30 ENCOUNTER — Ambulatory Visit: Payer: Medicare HMO | Admitting: Internal Medicine

## 2022-05-30 ENCOUNTER — Encounter: Payer: Self-pay | Admitting: Internal Medicine

## 2022-05-30 DIAGNOSIS — I779 Disorder of arteries and arterioles, unspecified: Secondary | ICD-10-CM

## 2022-05-30 DIAGNOSIS — R918 Other nonspecific abnormal finding of lung field: Secondary | ICD-10-CM

## 2022-05-30 DIAGNOSIS — J449 Chronic obstructive pulmonary disease, unspecified: Secondary | ICD-10-CM | POA: Diagnosis not present

## 2022-05-30 DIAGNOSIS — I700 Atherosclerosis of aorta: Secondary | ICD-10-CM | POA: Diagnosis not present

## 2022-05-30 DIAGNOSIS — Z8679 Personal history of other diseases of the circulatory system: Secondary | ICD-10-CM

## 2022-05-30 DIAGNOSIS — E7800 Pure hypercholesterolemia, unspecified: Secondary | ICD-10-CM

## 2022-05-30 DIAGNOSIS — Z9889 Other specified postprocedural states: Secondary | ICD-10-CM

## 2022-05-30 DIAGNOSIS — I10 Essential (primary) hypertension: Secondary | ICD-10-CM

## 2022-05-30 DIAGNOSIS — R739 Hyperglycemia, unspecified: Secondary | ICD-10-CM

## 2022-05-30 DIAGNOSIS — M069 Rheumatoid arthritis, unspecified: Secondary | ICD-10-CM

## 2022-05-30 NOTE — Progress Notes (Signed)
Patient ID: Erica Little, female   DOB: 05/07/1946, 75 y.o.   MRN: [id number]   Subjective:    Patient ID: Erica Little, female    DOB: 11/22/1946, 75 y.o.   MRN: [id number]   Patient here for work in appt.   HPI Work in to discuss reaction to praluent.  Had side effects with statin medication.  Started on praluent. After third shot started noticing fatigue.  Did not feel like dancing.  Noticed last week - increased swelling - ankles/lower legs.  Not feel as well.  Aching.  No fever.  No chest pain or sob.  No abdominal pain.  She is eating.  Last injection - 9 days ago.  Started to feel better, further out from her shot. Has been keeping feel elevated.     Past Medical History:  Diagnosis Date   Family history of adverse reaction to anesthesia    Mother - PONV   Family history of brain aneurysm    GERD (gastroesophageal reflux disease)    Hyperlipidemia    Hypertension    MRSA (methicillin resistant Staphylococcus aureus)    skin infections   Rheumatoid arthritis(714.0)    transaminitis on MTX, remicade rxn, s/p sulfasalazine, orencia   Ulcer disease    Past Surgical History:  Procedure Laterality Date   CATARACT EXTRACTION W/PHACO Left 01/22/2021   Procedure: CATARACT EXTRACTION PHACO AND INTRAOCULAR LENS PLACEMENT (IOC) LEFT;  Surgeon: King, Bradley Mark, MD;  Location: [facility];  Service: Ophthalmology;  Laterality: Left;  5.90 0:37.7   CATARACT EXTRACTION W/PHACO Right 02/05/2021   Procedure: CATARACT EXTRACTION PHACO AND INTRAOCULAR LENS PLACEMENT (IOC) RIGHT 4.33 00:34.6;  Surgeon: King, Bradley Mark, MD;  Location: [facility];  Service: Ophthalmology;  Laterality: Right;   CHOLECYSTECTOMY  2010   INTRACRANIAL ANEURYSM REPAIR  05/29/2016   MCP replacements  2001   left   TUBAL LIGATION     Family History  Problem Relation Age of Onset   Heart disease Mother    Diabetes Mother    Hypercholesterolemia Mother    Heart disease Father    Heart  disease Brother        MI age 58   Hypercholesterolemia Sister    Cancer Sister    Colon polyps Sister    Arthritis/Rheumatoid Paternal Aunt    Arthritis/Rheumatoid Paternal Uncle    Breast cancer Neg Hx    Social History   Socioeconomic History   Marital status: Widowed    Spouse name: Not on file   Number of children: 1   Years of education: Not on file   Highest education level: Not on file  Occupational History   Not on file  Tobacco Use   Smoking status: Former    Types: Cigarettes    Quit date: 12/30/1988    Years since quitting: 33.4   Smokeless tobacco: Never  Vaping Use   Vaping Use: Never used  Substance and Sexual Activity   Alcohol use: No    Alcohol/week: 0.0 standard drinks   Drug use: No   Sexual activity: Yes  Other Topics Concern   Not on file  Social History Narrative   Enjoys dancing    Social Determinants of Health   Financial Resource Strain: Low Risk    Difficulty of Paying Living Expenses: Not very hard  Food Insecurity: No Food Insecurity   Worried About Running Out of Food in the Last Year: Never true   Ran Out of Food in the   Last Year: Never true  Transportation Needs: No Transportation Needs   Lack of Transportation (Medical): No   Lack of Transportation (Non-Medical): No  Physical Activity: Sufficiently Active   Days of Exercise per Week: 5 days   Minutes of Exercise per Session: 30 min  Stress: No Stress Concern Present   Feeling of Stress : Not at all  Social Connections: Unknown   Frequency of Communication with Friends and Family: More than three times a week   Frequency of Social Gatherings with Friends and Family: More than three times a week   Attends Religious Services: Not on file   Active Member of Clubs or Organizations: Not on file   Attends Club or Organization Meetings: Not on file   Marital Status: Widowed     Review of Systems  Constitutional:  Positive for fatigue. Negative for appetite change.  HENT:  Negative  for congestion and sinus pressure.   Respiratory:  Negative for cough, chest tightness and shortness of breath.   Cardiovascular:  Negative for chest pain and palpitations.       Ankle and lower leg swelling   Gastrointestinal:  Negative for abdominal pain, diarrhea, nausea and vomiting.  Genitourinary:  Negative for difficulty urinating and dysuria.  Musculoskeletal:        Increased ankle swelling.  Aching.   Skin:  Negative for color change and rash.  Neurological:  Negative for dizziness, light-headedness and headaches.  Psychiatric/Behavioral:  Negative for agitation and dysphoric mood.       Objective:     BP 128/78 (BP Location: Left Arm, Patient Position: Sitting, Cuff Size: Small)   Pulse 79   Temp 97.9 F (36.6 C) (Temporal)   Resp 15   Ht 4' 11" (1.499 m)   Wt 98 lb 3.2 oz (44.5 kg)   SpO2 99%   BMI 19.83 kg/m  Wt Readings from Last 3 Encounters:  05/30/22 98 lb 3.2 oz (44.5 kg)  04/11/22 102 lb (46.3 kg)  11/29/21 103 lb (46.7 kg)    Physical Exam Vitals reviewed.  Constitutional:      General: She is not in acute distress.    Appearance: Normal appearance.  HENT:     Head: Normocephalic and atraumatic.     Right Ear: External ear normal.     Left Ear: External ear normal.  Eyes:     General: No scleral icterus.       Right eye: No discharge.        Left eye: No discharge.     Conjunctiva/sclera: Conjunctivae normal.  Neck:     Thyroid: No thyromegaly.  Cardiovascular:     Rate and Rhythm: Normal rate and regular rhythm.  Pulmonary:     Effort: No respiratory distress.     Breath sounds: Normal breath sounds. No wheezing.  Abdominal:     General: Bowel sounds are normal.     Palpations: Abdomen is soft.     Tenderness: There is no abdominal tenderness.  Musculoskeletal:     Cervical back: Neck supple. No tenderness.     Comments: Minimal soft tissue swelling - ankles.    Lymphadenopathy:     Cervical: No cervical adenopathy.  Skin:     Findings: No erythema or rash.  Neurological:     Mental Status: She is alert.  Psychiatric:        Mood and Affect: Mood normal.        Behavior: Behavior normal.     Outpatient Encounter Medications   as of 05/30/2022  Medication Sig   abatacept (ORENCIA) 250 MG injection Once every month   amLODipine (NORVASC) 2.5 MG tablet TAKE 1 TABLET BY MOUTH EVERY DAY   aspirin 81 MG chewable tablet Chew 81 mg by mouth daily.   calcium gluconate 650 MG tablet Take 650 mg by mouth daily.   carbamide peroxide (DEBROX) 6.5 % OTIC solution 5 drops in right ear q day.  Massage for approximately 5 minutes.   cholecalciferol (VITAMIN D) 1000 UNITS tablet Take 1,000 Units by mouth daily.   ELDERBERRY PO Take by mouth daily.   folic acid (FOLVITE) 1 MG tablet Take 1 mg by mouth daily. (Except MTX days)   lovastatin (MEVACOR) 10 MG tablet Take 1 tablet (10 mg total) by mouth at bedtime.   magnesium oxide (MAG-OX) 400 MG tablet Take 400 mg by mouth daily.   methotrexate 50 MG/2ML injection INJECT 0.2MLS SUBCUTANEOUSLY WEEKLY DISPENSE 2 VIALS   pantoprazole (PROTONIX) 40 MG tablet TAKE 1 TABLET BY MOUTH EVERY DAY   telmisartan (MICARDIS) 40 MG tablet TAKE 1 TABLET BY MOUTH EVERY DAY   Triamcinolone Acetonide (NASACORT AQ NA) Place into the nose as needed.   valACYclovir (VALTREX) 1000 MG tablet TAKE 1 TABLET BY MOUTH EVERY DAY   [DISCONTINUED] PRALUENT 75 MG/ML SOAJ INJECT 75 MG INTO THE SKIN EVERY 14 DAYS. *PLEASE PUT PT ASSISTANCE (PDMI BIN) ON AS SECONDARY*   No facility-administered encounter medications on file as of 05/30/2022.     Lab Results  Component Value Date   WBC 5.9 11/26/2021   HGB 12.6 11/26/2021   HCT 38.9 11/26/2021   PLT 206.0 11/26/2021   GLUCOSE 79 04/09/2022   CHOL 170 04/09/2022   TRIG 108.0 04/09/2022   HDL 62.30 04/09/2022   LDLDIRECT 167.8 12/20/2013   LDLCALC 86 04/09/2022   ALT 11 04/09/2022   AST 19 04/09/2022   NA 133 (L) 04/09/2022   K 4.3 04/09/2022   CL 99  04/09/2022   CREATININE 0.83 04/09/2022   BUN 11 04/09/2022   CO2 26 04/09/2022   TSH 2.71 11/26/2021   INR 1.1 (H) 12/06/2013   HGBA1C 5.8 11/26/2021    US BREAST LTD UNI LEFT INC AXILLA  Result Date: 08/29/2021 CLINICAL DATA:  Recall for possible mass in the left breast. EXAM: DIGITAL DIAGNOSTIC UNILATERAL LEFT MAMMOGRAM WITH TOMOSYNTHESIS AND CAD; ULTRASOUND LEFT BREAST LIMITED TECHNIQUE: Left digital diagnostic mammography and breast tomosynthesis was performed. The images were evaluated with computer-aided detection.; Targeted ultrasound examination of the left breast was performed. COMPARISON:  Previous exam(s). ACR Breast Density Category b: There are scattered areas of fibroglandular density. FINDINGS: The previously noted possible mass in the outer upper left breast does not persist on additional views, consistent with overlapping fibroglandular tissue. Confirmatory targeted ultrasound of the upper-outer left breast at the 1:00 to 3:00 positions was performed. No suspicious solid or cystic mass. IMPRESSION: No mammographic or sonographic findings of malignancy in the left breast. RECOMMENDATION: Annual screening mammogram. I have discussed the findings and recommendations with the patient. If applicable, a reminder letter will be sent to the patient regarding the next appointment. BI-RADS CATEGORY  1: Negative. Electronically Signed   By: Ileana Roup M.D.   On: 08/29/2021 12:27  MM DIAG BREAST TOMO UNI LEFT  Result Date: 08/29/2021 CLINICAL DATA:  Recall for possible mass in the left breast. EXAM: DIGITAL DIAGNOSTIC UNILATERAL LEFT MAMMOGRAM WITH TOMOSYNTHESIS AND CAD; ULTRASOUND LEFT BREAST LIMITED TECHNIQUE: Left digital diagnostic mammography and breast tomosynthesis  was performed. The images were evaluated with computer-aided detection.; Targeted ultrasound examination of the left breast was performed. COMPARISON:  Previous exam(s). ACR Breast Density Category b: There are scattered  areas of fibroglandular density. FINDINGS: The previously noted possible mass in the outer upper left breast does not persist on additional views, consistent with overlapping fibroglandular tissue. Confirmatory targeted ultrasound of the upper-outer left breast at the 1:00 to 3:00 positions was performed. No suspicious solid or cystic mass. IMPRESSION: No mammographic or sonographic findings of malignancy in the left breast. RECOMMENDATION: Annual screening mammogram. I have discussed the findings and recommendations with the patient. If applicable, a reminder letter will be sent to the patient regarding the next appointment. BI-RADS CATEGORY  1: Negative. Electronically Signed   By: Laura  Parra M.D.   On: 08/29/2021 12:27      Assessment & Plan:   Problem List Items Addressed This Visit     Aortic atherosclerosis (HCC)    Did not tolerate praluent as outlined.  Leave off medication for a couple of weeks.  Call with update.  If symptoms resolved, restart lovastatin.        Carotid artery disease (HCC)    Carotid ultrasound 10/2021- Dr Schnier - recommended f/u carotid ultrasound in 2 years.       COPD with chronic bronchitis and emphysema (HCC)    Has seen Dr Gonzalez.  Breathing stable.  Follow.        History of cerebral aneurysm     S/p stenting.  Being followed at [facility]. On plavix.  Last evaluated 07/14/19.  Recommended f/u in 2 years.  Has MRA scheduled 06/12/22       Hypercholesteremia    Intolerant to praluent.  Will stop medication.  Call with update over the next two weeks.  If symptoms resolve, will restart lovastatin (only statin has been able to tolerate).        Hyperglycemia    Follow met b and a1c.        Hypertension    Continue micardis and amlodipine. Continue to spot check pressures.  Follow metabolic panel.        Lung nodules    Followed by pulmonary.  Had f/u CT scan 10/2020.   Stable.  Recommended f/u in 2 years.          Rheumatoid arthritis (HCC)    On MTX and orencia.  Doing well.  Followed by rheumatology.          Charlene Scott, MD  

## 2022-06-02 ENCOUNTER — Encounter: Payer: Self-pay | Admitting: Internal Medicine

## 2022-06-02 NOTE — Assessment & Plan Note (Signed)
Carotid ultrasound 10/2021- Dr Schnier - recommended f/u carotid ultrasound in 2 years.  

## 2022-06-02 NOTE — Assessment & Plan Note (Signed)
Intolerant to praluent.  Will stop medication.  Call with update over the next two weeks.  If symptoms resolve, will restart lovastatin (only statin has been able to tolerate).

## 2022-06-02 NOTE — Assessment & Plan Note (Signed)
Continue micardis and amlodipine. Continue to spot check pressures.  Follow metabolic panel.  

## 2022-06-02 NOTE — Assessment & Plan Note (Signed)
Follow met b and a1c.  

## 2022-06-02 NOTE — Assessment & Plan Note (Signed)
Has seen Dr Gonzalez.  Breathing stable.  Follow.  

## 2022-06-02 NOTE — Assessment & Plan Note (Signed)
Followed by pulmonary.  Had f/u CT scan 10/2020.   Stable.  Recommended f/u in 2 years.   

## 2022-06-02 NOTE — Assessment & Plan Note (Signed)
S/p stenting.  Being followed at [facility]. On plavix.  Last evaluated 07/14/19.  Recommended f/u in 2 years.  Has MRA scheduled 06/12/22

## 2022-06-02 NOTE — Assessment & Plan Note (Signed)
Did not tolerate praluent as outlined.  Leave off medication for a couple of weeks.  Call with update.  If symptoms resolved, restart lovastatin.

## 2022-06-02 NOTE — Assessment & Plan Note (Signed)
On MTX and orencia.  Doing well.  Followed by rheumatology.  

## 2022-06-08 ENCOUNTER — Other Ambulatory Visit: Payer: Self-pay | Admitting: Internal Medicine

## 2022-06-19 ENCOUNTER — Telehealth: Payer: Self-pay | Admitting: Internal Medicine

## 2022-06-19 NOTE — Telephone Encounter (Signed)
Pt called stating she is feeling better since she has been off of the cholesterol pen. Pt stated she would like for the provider to tell her the next steps

## 2022-06-19 NOTE — Telephone Encounter (Signed)
S/w pt - stated she is no longer having any ankle swelling and almost has all her strength back. Stated she is feeling much better.  Wasn't sure how you wanted her to proceed.

## 2022-06-20 NOTE — Telephone Encounter (Signed)
Pt advised - stated has lovastatin 10mg  at home. Will call when she needs a refill.

## 2022-06-20 NOTE — Telephone Encounter (Signed)
Reviewed.  Since being off praluent, she is doing better.  Feeling better.  She has had problems with statin medication.  The only statin she tolerated was lovastatin (low dose).  Would recommend restarting lovastatin 10mg  q day.  Let know if any problems.

## 2022-08-07 ENCOUNTER — Other Ambulatory Visit (INDEPENDENT_AMBULATORY_CARE_PROVIDER_SITE_OTHER): Payer: Medicare HMO

## 2022-08-07 DIAGNOSIS — R739 Hyperglycemia, unspecified: Secondary | ICD-10-CM | POA: Diagnosis not present

## 2022-08-07 DIAGNOSIS — E7800 Pure hypercholesterolemia, unspecified: Secondary | ICD-10-CM | POA: Diagnosis not present

## 2022-08-07 DIAGNOSIS — I10 Essential (primary) hypertension: Secondary | ICD-10-CM | POA: Diagnosis not present

## 2022-08-07 LAB — BASIC METABOLIC PANEL
BUN: 10 mg/dL (ref 6–23)
CO2: 26 mEq/L (ref 19–32)
Calcium: 9.1 mg/dL (ref 8.4–10.5)
Chloride: 97 mEq/L (ref 96–112)
Creatinine, Ser: 0.83 mg/dL (ref 0.40–1.20)
GFR: 67.9 mL/min (ref >60.00)
Glucose, Bld: 80 mg/dL (ref 70–99)
Potassium: 4.6 mEq/L (ref 3.5–5.1)
Sodium: 131 mEq/L — ABNORMAL LOW (ref 135–145)

## 2022-08-07 LAB — LIPID PANEL
Cholesterol: 164 mg/dL (ref 0–200)
HDL: 56.8 mg/dL (ref >39.00)
LDL Cholesterol: 88 mg/dL (ref 0–99)
NonHDL: 107.18
Total CHOL/HDL Ratio: 3
Triglycerides: 96 mg/dL (ref 0.0–149.0)
VLDL: 19.2 mg/dL (ref 0.0–40.0)

## 2022-08-07 LAB — HEPATIC FUNCTION PANEL
ALT: 11 U/L (ref 0–35)
AST: 18 U/L (ref 0–37)
Albumin: 4.2 g/dL (ref 3.5–5.2)
Alkaline Phosphatase: 85 U/L (ref 39–117)
Bilirubin, Direct: 0.1 mg/dL (ref 0.0–0.3)
Total Bilirubin: 0.9 mg/dL (ref 0.2–1.2)
Total Protein: 6.2 g/dL (ref 6.0–8.3)

## 2022-08-07 LAB — HEMOGLOBIN A1C: Hgb A1c MFr Bld: 6.1 % (ref 4.6–6.5)

## 2022-08-08 ENCOUNTER — Other Ambulatory Visit: Payer: Self-pay

## 2022-08-08 DIAGNOSIS — E871 Hypo-osmolality and hyponatremia: Secondary | ICD-10-CM

## 2022-08-08 DIAGNOSIS — I700 Atherosclerosis of aorta: Secondary | ICD-10-CM

## 2022-08-12 ENCOUNTER — Ambulatory Visit: Payer: Medicare HMO | Admitting: Internal Medicine

## 2022-08-13 ENCOUNTER — Encounter: Payer: Self-pay | Admitting: Internal Medicine

## 2022-08-13 ENCOUNTER — Ambulatory Visit: Payer: Medicare HMO | Admitting: Internal Medicine

## 2022-08-13 DIAGNOSIS — R918 Other nonspecific abnormal finding of lung field: Secondary | ICD-10-CM

## 2022-08-13 DIAGNOSIS — J449 Chronic obstructive pulmonary disease, unspecified: Secondary | ICD-10-CM

## 2022-08-13 DIAGNOSIS — Z8371 Family history of colonic polyps: Secondary | ICD-10-CM

## 2022-08-13 DIAGNOSIS — I779 Disorder of arteries and arterioles, unspecified: Secondary | ICD-10-CM

## 2022-08-13 DIAGNOSIS — R739 Hyperglycemia, unspecified: Secondary | ICD-10-CM

## 2022-08-13 DIAGNOSIS — E7800 Pure hypercholesterolemia, unspecified: Secondary | ICD-10-CM

## 2022-08-13 DIAGNOSIS — I10 Essential (primary) hypertension: Secondary | ICD-10-CM

## 2022-08-13 DIAGNOSIS — E871 Hypo-osmolality and hyponatremia: Secondary | ICD-10-CM

## 2022-08-13 DIAGNOSIS — Z9889 Other specified postprocedural states: Secondary | ICD-10-CM

## 2022-08-13 DIAGNOSIS — Z8679 Personal history of other diseases of the circulatory system: Secondary | ICD-10-CM

## 2022-08-13 DIAGNOSIS — I700 Atherosclerosis of aorta: Secondary | ICD-10-CM | POA: Diagnosis not present

## 2022-08-13 DIAGNOSIS — R634 Abnormal weight loss: Secondary | ICD-10-CM

## 2022-08-13 DIAGNOSIS — M069 Rheumatoid arthritis, unspecified: Secondary | ICD-10-CM

## 2022-08-13 NOTE — Progress Notes (Signed)
Patient ID: Erica Little, female   DOB: 29-May-1946, 76 y.o.   MRN: [id number]   Subjective:    Patient ID: Erica Little, female    DOB: [birth year], 76 y.o.   MRN: [id number]   Patient here for a scheduled follow up.   Chief Complaint  Patient presents with   Follow-up    4 month follow up   .   HPI Here to follow up regarding her cholesterol and blood pressure.  Reports she is doing relatively well.  Tries to stay active.  No chest pain or sob reported.  Eating.  No nausea or vomiting.  Just saw Dr Posey Pronto yesterday - f/u RA.  Continues on MTX and orencia. Joints - stable.  Did not tolerate praluent - increased fatigue.  Back to dancing now.  Tolerated lovastatin (low dose).  Agreeable to go back on low dose lovastatin.    Past Medical History:  Diagnosis Date   Family history of adverse reaction to anesthesia    Mother - PONV   Family history of brain aneurysm    GERD (gastroesophageal reflux disease)    Hyperlipidemia    Hypertension    MRSA (methicillin resistant Staphylococcus aureus)    skin infections   Rheumatoid arthritis(714.0)    transaminitis on MTX, remicade rxn, s/p sulfasalazine, orencia   Ulcer disease    Past Surgical History:  Procedure Laterality Date   CATARACT EXTRACTION W/PHACO Left 01/22/2021   Procedure: CATARACT EXTRACTION PHACO AND INTRAOCULAR LENS PLACEMENT ([place]) LEFT;  Surgeon: Eulogio Bear, MD;  Location: [place];  Service: Ophthalmology;  Laterality: Left;  5.90 0:37.7   CATARACT EXTRACTION W/PHACO Right 02/05/2021   Procedure: CATARACT EXTRACTION PHACO AND INTRAOCULAR LENS PLACEMENT ([facility]) RIGHT 4.33 00:34.6;  Surgeon: Eulogio Bear, MD;  Location: [place];  Service: Ophthalmology;  Laterality: Right;   CHOLECYSTECTOMY  2010   INTRACRANIAL ANEURYSM REPAIR  05/29/2016   MCP replacements  2001   left   TUBAL LIGATION     Family History  Problem Relation Age of Onset   Heart disease Mother    Diabetes  Mother    Hypercholesterolemia Mother    Heart disease Father    Heart disease Brother        MI age 62   Hypercholesterolemia Sister    Cancer Sister    Colon polyps Sister    Arthritis/Rheumatoid Paternal Aunt    Arthritis/Rheumatoid Paternal Uncle    Breast cancer Neg Hx    Social History   Socioeconomic History   Marital status: Widowed    Spouse name: Not on file   Number of children: 1   Years of education: Not on file   Highest education level: Not on file  Occupational History   Not on file  Tobacco Use   Smoking status: Former    Types: Cigarettes    Quit date: 12/30/1988    Years since quitting: 33.6   Smokeless tobacco: Never  Vaping Use   Vaping Use: Never used  Substance and Sexual Activity   Alcohol use: No    Alcohol/week: 0.0 standard drinks of alcohol   Drug use: No   Sexual activity: Yes  Other Topics Concern   Not on file  Social History Narrative   Enjoys dancing    Social Determinants of Health   Financial Resource Strain: Low Risk  (02/19/2022)   Overall Financial Resource Strain (CARDIA)    Difficulty of Paying Living Expenses: Not very hard  Recent Concern: Financial Resource Strain - Medium Risk (01/09/2022)   Overall Financial Resource Strain (CARDIA)    Difficulty of Paying Living Expenses: Somewhat hard  Food Insecurity: No Food Insecurity (11/29/2021)   Hunger Vital Sign    Worried About Running Out of Food in the Last Year: Never true    [place] in the Last Year: Never true  Transportation Needs: No Transportation Needs (11/29/2021)   PRAPARE - Hydrologist (Medical): No    Lack of Transportation (Non-Medical): No  Physical Activity: Sufficiently Active (11/29/2021)   Exercise Vital Sign    Days of Exercise per Week: 5 days    Minutes of Exercise per Session: 30 min  Stress: No Stress Concern Present (11/29/2021)   [place]     Feeling of Stress : Not at all  Social Connections: Unknown (11/29/2021)   Social Connection and Isolation Panel [NHANES]    Frequency of Communication with Friends and Family: More than three times a week    Frequency of Social Gatherings with Friends and Family: More than three times a week    Attends Religious Services: Not on file    Active Member of Clubs or Organizations: Not on file    Attends Archivist Meetings: Not on file    Marital Status: Widowed     Review of Systems  Constitutional:  Negative for appetite change and unexpected weight change.  HENT:  Negative for congestion and sinus pressure.   Respiratory:  Negative for cough, chest tightness and shortness of breath.   Cardiovascular:  Negative for chest pain, palpitations and leg swelling.  Gastrointestinal:  Negative for abdominal pain, diarrhea, nausea and vomiting.  Genitourinary:  Negative for difficulty urinating and dysuria.  Musculoskeletal:  Negative for myalgias.       Chronic changes - hands - RA  Skin:  Negative for color change and rash.  Neurological:  Negative for dizziness, light-headedness and headaches.  Psychiatric/Behavioral:  Negative for agitation and dysphoric mood.        Objective:     BP 126/82 (BP Location: Left Arm, Patient Position: Sitting, Cuff Size: Normal)   Pulse 66   Temp 97.8 F (36.6 C) (Oral)   Resp 14   Ht 4' 11" (1.499 m)   Wt 99 lb (44.9 kg)   SpO2 98%   BMI 20.00 kg/m  Wt Readings from Last 3 Encounters:  08/13/22 99 lb (44.9 kg)  05/30/22 98 lb 3.2 oz (44.5 kg)  04/11/22 102 lb (46.3 kg)    Physical Exam Vitals reviewed.  Constitutional:      General: She is not in acute distress.    Appearance: Normal appearance.  HENT:     Head: Normocephalic and atraumatic.     Right Ear: External ear normal.     Left Ear: External ear normal.  Eyes:     General: No scleral icterus.       Right eye: No discharge.        Left eye: No discharge.      Conjunctiva/sclera: Conjunctivae normal.  Neck:     Thyroid: No thyromegaly.  Cardiovascular:     Rate and Rhythm: Normal rate and regular rhythm.  Pulmonary:     Effort: No respiratory distress.     Breath sounds: Normal breath sounds. No wheezing.  Abdominal:     General: Bowel sounds are normal.     Palpations: Abdomen  is soft.     Tenderness: There is no abdominal tenderness.  Musculoskeletal:        General: No swelling or tenderness.     Cervical back: Neck supple. No tenderness.  Lymphadenopathy:     Cervical: No cervical adenopathy.  Skin:    Findings: No erythema or rash.  Neurological:     Mental Status: She is alert.  Psychiatric:        Mood and Affect: Mood normal.        Behavior: Behavior normal.      Outpatient Encounter Medications as of 08/13/2022  Medication Sig   abatacept (ORENCIA) 250 MG injection Once every month   amLODipine (NORVASC) 2.5 MG tablet TAKE 1 TABLET BY MOUTH EVERY DAY   aspirin 81 MG chewable tablet Chew 81 mg by mouth daily.   calcium gluconate 650 MG tablet Take 650 mg by mouth daily.   carbamide peroxide (DEBROX) 6.5 % OTIC solution 5 drops in right ear q day.  Massage for approximately 5 minutes.   cholecalciferol (VITAMIN D) 1000 UNITS tablet Take 1,000 Units by mouth daily.   ELDERBERRY PO Take by mouth daily.   folic acid (FOLVITE) 1 MG tablet Take 1 mg by mouth daily. (Except MTX days)   lovastatin (MEVACOR) 10 MG tablet Take 1 tablet (10 mg total) by mouth at bedtime.   magnesium oxide (MAG-OX) 400 MG tablet Take 400 mg by mouth daily.   methotrexate 50 MG/2ML injection INJECT 0.2MLS SUBCUTANEOUSLY WEEKLY DISPENSE 2 VIALS   pantoprazole (PROTONIX) 40 MG tablet TAKE 1 TABLET BY MOUTH EVERY DAY   telmisartan (MICARDIS) 40 MG tablet TAKE 1 TABLET BY MOUTH EVERY DAY   Triamcinolone Acetonide (NASACORT AQ NA) Place into the nose as needed.   valACYclovir (VALTREX) 1000 MG tablet TAKE 1 TABLET BY MOUTH EVERY DAY   No  facility-administered encounter medications on file as of 08/13/2022.     Lab Results  Component Value Date   WBC 5.9 11/26/2021   HGB 12.6 11/26/2021   HCT 38.9 11/26/2021   PLT 206.0 11/26/2021   GLUCOSE 80 08/07/2022   CHOL 164 08/07/2022   TRIG 96.0 08/07/2022   HDL 56.80 08/07/2022   LDLDIRECT 167.8 12/20/2013   LDLCALC 88 08/07/2022   ALT 11 08/07/2022   AST 18 08/07/2022   NA 131 (L) 08/07/2022   K 4.6 08/07/2022   CL 97 08/07/2022   CREATININE 0.83 08/07/2022   BUN 10 08/07/2022   CO2 26 08/07/2022   TSH 2.71 11/26/2021   INR 1.1 (H) 12/06/2013   HGBA1C 6.1 08/07/2022    MM DIAG BREAST TOMO UNI LEFT  Result Date: 08/29/2021 CLINICAL DATA:  Recall for possible mass in the left breast. EXAM: DIGITAL DIAGNOSTIC UNILATERAL LEFT MAMMOGRAM WITH TOMOSYNTHESIS AND CAD; ULTRASOUND LEFT BREAST LIMITED TECHNIQUE: Left digital diagnostic mammography and breast tomosynthesis was performed. The images were evaluated with computer-aided detection.; Targeted ultrasound examination of the left breast was performed. COMPARISON:  Previous exam(s). ACR Breast Density Category b: There are scattered areas of fibroglandular density. FINDINGS: The previously noted possible mass in the outer upper left breast does not persist on additional views, consistent with overlapping fibroglandular tissue. Confirmatory targeted ultrasound of the upper-outer left breast at the 1:00 to 3:00 positions was performed. No suspicious solid or cystic mass. IMPRESSION: No mammographic or sonographic findings of malignancy in the left breast. RECOMMENDATION: Annual screening mammogram. I have discussed the findings and recommendations with the patient. If applicable, a reminder letter  will be sent to the patient regarding the next appointment. BI-RADS CATEGORY  1: Negative. Electronically Signed   By: Ileana Roup M.D.   On: 08/29/2021 12:27  US BREAST LTD UNI LEFT INC AXILLA  Result Date: 08/29/2021 CLINICAL DATA:   Recall for possible mass in the left breast. EXAM: DIGITAL DIAGNOSTIC UNILATERAL LEFT MAMMOGRAM WITH TOMOSYNTHESIS AND CAD; ULTRASOUND LEFT BREAST LIMITED TECHNIQUE: Left digital diagnostic mammography and breast tomosynthesis was performed. The images were evaluated with computer-aided detection.; Targeted ultrasound examination of the left breast was performed. COMPARISON:  Previous exam(s). ACR Breast Density Category b: There are scattered areas of fibroglandular density. FINDINGS: The previously noted possible mass in the outer upper left breast does not persist on additional views, consistent with overlapping fibroglandular tissue. Confirmatory targeted ultrasound of the upper-outer left breast at the 1:00 to 3:00 positions was performed. No suspicious solid or cystic mass. IMPRESSION: No mammographic or sonographic findings of malignancy in the left breast. RECOMMENDATION: Annual screening mammogram. I have discussed the findings and recommendations with the patient. If applicable, a reminder letter will be sent to the patient regarding the next appointment. BI-RADS CATEGORY  1: Negative. Electronically Signed   By: Ileana Roup M.D.   On: 08/29/2021 12:27      Assessment & Plan:   Problem List Items Addressed This Visit     Aortic atherosclerosis ([place])    Did not tolerate praluent as outlined. Agreeable to low dose lovastatin.       Carotid artery disease ([place])    Carotid ultrasound 10/2021- Dr Delana Meyer - recommended f/u carotid ultrasound in 2 years.       COPD with chronic bronchitis and emphysema ([place])    Has seen Dr Patsey Berthold.  Breathing stable.  Follow.       Family history of colonic polyps    Colonoscopy 08/2014.  Recommended f/u 08/2019.  Overdue.  See if agreeable for referral.        History of cerebral aneurysm     S/p stenting.  Being followed at [facility]. On aspirin.   Last evaluated 05/2012.  MRA  06/12/22 - ok.  Recommended continuing aspirin.  F/u in 3  years.       Hypercholesteremia    Intolerant to praluent.  Feels better off.  Lovastatin 33m q day.  Tolerates.  Follow lipid panel and liver function tests.        Relevant Orders   TSH   Lipid panel   Hepatic function panel   Hyperglycemia    Follow met b and a1c.       Relevant Orders   Hemoglobin A1c   Hypertension    Continue micardis and amlodipine. Continue to spot check pressures.  Follow metabolic panel.       Relevant Orders   Basic metabolic panel   Hyponatremia    Has seen nephrology.  Stable.  Follow metabolic panel.       Lung nodules    Followed by pulmonary.  Had f/u CT scan 10/2020.   Stable.  Recommended f/u in 2 years.        [place]heumatoid arthritis ([place]    On MTX and orencia.  Stable.  Followed by rheumatology.  Just evaluated yesterday.       Weight loss    Weight stable from last check.  Follow.         CEinar Pheasant MD

## 2022-08-25 ENCOUNTER — Telehealth: Payer: Self-pay | Admitting: Internal Medicine

## 2022-08-25 ENCOUNTER — Encounter: Payer: Self-pay | Admitting: Internal Medicine

## 2022-08-25 NOTE — Telephone Encounter (Signed)
Notify - per our records she is overdue colonoscopy.  See if has had relatively recent colonoscopy. If not, would like to refer.  Previously saw Dr Markham Jordan. He has retired.  Does she have a preference of which GI she prefers to see.

## 2022-08-25 NOTE — Assessment & Plan Note (Signed)
Carotid ultrasound 10/2021- Dr Schnier - recommended f/u carotid ultrasound in 2 years.  

## 2022-08-25 NOTE — Assessment & Plan Note (Signed)
On MTX and orencia.  Stable.  Followed by rheumatology.  Just evaluated yesterday.  

## 2022-08-25 NOTE — Assessment & Plan Note (Signed)
Did not tolerate praluent as outlined. Agreeable to low dose lovastatin.

## 2022-08-25 NOTE — Assessment & Plan Note (Signed)
Weight stable from last check.  Follow.  

## 2022-08-25 NOTE — Assessment & Plan Note (Signed)
S/p stenting.  Being followed at [facility]. On aspirin.   Last evaluated 05/2012.  MRA  06/12/22 - ok.  Recommended continuing aspirin.  F/u in 3 years.

## 2022-08-25 NOTE — Assessment & Plan Note (Signed)
Has seen Dr Gonzalez.  Breathing stable.  Follow.  

## 2022-08-25 NOTE — Assessment & Plan Note (Signed)
Has seen nephrology.  Stable.  Follow metabolic panel.   

## 2022-08-25 NOTE — Assessment & Plan Note (Signed)
Follow met b and a1c.  

## 2022-08-25 NOTE — Assessment & Plan Note (Signed)
Intolerant to praluent.  Feels better off.  Lovastatin 10mg q day.  Tolerates.  Follow lipid panel and liver function tests.   

## 2022-08-25 NOTE — Assessment & Plan Note (Signed)
Continue micardis and amlodipine. Continue to spot check pressures.  Follow metabolic panel.  

## 2022-08-25 NOTE — Assessment & Plan Note (Signed)
Colonoscopy 08/2014.  Recommended f/u 08/2019.  Overdue.  See if agreeable for referral.

## 2022-08-25 NOTE — Assessment & Plan Note (Signed)
Followed by pulmonary.  Had f/u CT scan 10/2020.   Stable.  Recommended f/u in 2 years.   

## 2022-09-04 ENCOUNTER — Ambulatory Visit
Admission: RE | Admit: 2022-09-04 | Discharge: 2022-09-04 | Disposition: A | Discharge status: Home / Self Care | Payer: Medicare HMO | Source: Ambulatory Visit | Attending: Internal Medicine | Admitting: Internal Medicine

## 2022-09-04 DIAGNOSIS — Z1231 Encounter for screening mammogram for malignant neoplasm of breast: Secondary | ICD-10-CM

## 2022-09-10 ENCOUNTER — Other Ambulatory Visit: Payer: Self-pay

## 2022-09-10 ENCOUNTER — Other Ambulatory Visit (INDEPENDENT_AMBULATORY_CARE_PROVIDER_SITE_OTHER): Payer: Medicare HMO

## 2022-09-10 DIAGNOSIS — E871 Hypo-osmolality and hyponatremia: Secondary | ICD-10-CM

## 2022-09-10 LAB — SODIUM: Sodium: 129 mEq/L — ABNORMAL LOW (ref 135–145)

## 2022-09-10 NOTE — Progress Notes (Signed)
Sodium order placed for repeat tomorrow 09/11/22. Draw sodium only.

## 2022-09-11 ENCOUNTER — Other Ambulatory Visit (INDEPENDENT_AMBULATORY_CARE_PROVIDER_SITE_OTHER): Payer: Medicare HMO

## 2022-09-11 DIAGNOSIS — I10 Essential (primary) hypertension: Secondary | ICD-10-CM | POA: Diagnosis not present

## 2022-09-11 LAB — BASIC METABOLIC PANEL
BUN: 13 mg/dL (ref 6–23)
CO2: 24 mEq/L (ref 19–32)
Calcium: 9.2 mg/dL (ref 8.4–10.5)
Chloride: 101 mEq/L (ref 96–112)
Creatinine, Ser: 0.92 mg/dL (ref 0.40–1.20)
GFR: 59.97 mL/min — ABNORMAL LOW (ref >60.00)
Glucose, Bld: 109 mg/dL — ABNORMAL HIGH (ref 70–99)
Potassium: 4.1 mEq/L (ref 3.5–5.1)
Sodium: 135 mEq/L (ref 135–145)

## 2022-09-12 ENCOUNTER — Telehealth: Payer: Self-pay | Admitting: *Deleted

## 2022-09-12 NOTE — Telephone Encounter (Signed)
Left voicemail to return call to discuss results below:  Your sodium level is normal.  Let us know if any problems.

## 2022-09-13 NOTE — Telephone Encounter (Signed)
Spoke with patient, see result note

## 2022-09-13 NOTE — Telephone Encounter (Signed)
Pt called and I read the message to her and she would like a call back from [facility]

## 2022-10-03 ENCOUNTER — Other Ambulatory Visit (INDEPENDENT_AMBULATORY_CARE_PROVIDER_SITE_OTHER): Payer: Medicare HMO

## 2022-10-03 DIAGNOSIS — R739 Hyperglycemia, unspecified: Secondary | ICD-10-CM | POA: Diagnosis not present

## 2022-10-03 DIAGNOSIS — E7800 Pure hypercholesterolemia, unspecified: Secondary | ICD-10-CM

## 2022-10-03 LAB — TSH: TSH: 2.79 u[IU]/mL (ref 0.35–5.50)

## 2022-10-03 LAB — HEPATIC FUNCTION PANEL
ALT: 10 U/L (ref 0–35)
AST: 19 U/L (ref 0–37)
Albumin: 4.1 g/dL (ref 3.5–5.2)
Alkaline Phosphatase: 79 U/L (ref 39–117)
Bilirubin, Direct: 0.1 mg/dL (ref 0.0–0.3)
Total Bilirubin: 0.9 mg/dL (ref 0.2–1.2)
Total Protein: 6.1 g/dL (ref 6.0–8.3)

## 2022-10-03 LAB — LIPID PANEL
Cholesterol: 184 mg/dL (ref 0–200)
HDL: 48.8 mg/dL (ref >39.00)
LDL Cholesterol: 113 mg/dL — ABNORMAL HIGH (ref 0–99)
NonHDL: 135.1
Total CHOL/HDL Ratio: 4
Triglycerides: 109 mg/dL (ref 0.0–149.0)
VLDL: 21.8 mg/dL (ref 0.0–40.0)

## 2022-10-03 LAB — HEMOGLOBIN A1C: Hgb A1c MFr Bld: 6 % (ref 4.6–6.5)

## 2022-10-07 ENCOUNTER — Telehealth: Payer: Self-pay | Admitting: Internal Medicine

## 2022-10-07 NOTE — Telephone Encounter (Signed)
Pt would like to be called regarding her lab appointment

## 2022-10-07 NOTE — Telephone Encounter (Signed)
Lvm for pt to return call in regards to her sodium. See note

## 2022-10-07 NOTE — Telephone Encounter (Signed)
It was checked 09/11/22 and was wnl.  Can schedule a f/u sodium in a few weeks to confirm remaining stable.

## 2022-10-07 NOTE — Telephone Encounter (Signed)
Pt called back and I read the message to her and she has scheduled a lab appointment for 10/30

## 2022-10-19 ENCOUNTER — Other Ambulatory Visit: Payer: Self-pay | Admitting: Internal Medicine

## 2022-10-20 ENCOUNTER — Other Ambulatory Visit: Payer: Self-pay | Admitting: Internal Medicine

## 2022-10-24 ENCOUNTER — Telehealth: Payer: Self-pay | Admitting: Internal Medicine

## 2022-10-24 DIAGNOSIS — E871 Hypo-osmolality and hyponatremia: Secondary | ICD-10-CM

## 2022-10-24 DIAGNOSIS — I10 Essential (primary) hypertension: Secondary | ICD-10-CM

## 2022-10-24 DIAGNOSIS — R739 Hyperglycemia, unspecified: Secondary | ICD-10-CM

## 2022-10-24 DIAGNOSIS — M069 Rheumatoid arthritis, unspecified: Secondary | ICD-10-CM

## 2022-10-24 DIAGNOSIS — E7800 Pure hypercholesterolemia, unspecified: Secondary | ICD-10-CM

## 2022-10-24 NOTE — Telephone Encounter (Signed)
Patient has a lab appt 10/28/2022, there are no orders in. 

## 2022-10-25 NOTE — Telephone Encounter (Signed)
Order placed for f/u met b 

## 2022-10-25 NOTE — Addendum Note (Signed)
Addended by: Alisa Graff on: 10/25/2022 03:57 AM   Modules accepted: Orders

## 2022-10-28 ENCOUNTER — Other Ambulatory Visit (INDEPENDENT_AMBULATORY_CARE_PROVIDER_SITE_OTHER): Payer: Medicare HMO

## 2022-10-28 DIAGNOSIS — E871 Hypo-osmolality and hyponatremia: Secondary | ICD-10-CM

## 2022-10-28 DIAGNOSIS — I10 Essential (primary) hypertension: Secondary | ICD-10-CM

## 2022-10-29 LAB — BASIC METABOLIC PANEL
BUN: 13 mg/dL (ref 6–23)
CO2: 24 mEq/L (ref 19–32)
Calcium: 8.8 mg/dL (ref 8.4–10.5)
Chloride: 98 mEq/L (ref 96–112)
Creatinine, Ser: 1.18 mg/dL (ref 0.40–1.20)
GFR: 44.44 mL/min — ABNORMAL LOW (ref >60.00)
Glucose, Bld: 79 mg/dL (ref 70–99)
Potassium: 4.6 mEq/L (ref 3.5–5.1)
Sodium: 130 mEq/L — ABNORMAL LOW (ref 135–145)

## 2022-10-30 ENCOUNTER — Telehealth: Payer: Self-pay | Admitting: Internal Medicine

## 2022-10-30 ENCOUNTER — Other Ambulatory Visit: Payer: Self-pay

## 2022-10-30 DIAGNOSIS — E871 Hypo-osmolality and hyponatremia: Secondary | ICD-10-CM

## 2022-10-30 NOTE — Telephone Encounter (Signed)
Pt concerned re: sodium Scheduled for 11/10 at 930am to discuss

## 2022-10-30 NOTE — Telephone Encounter (Signed)
Patient would like a phone call from [facility] about patients low sodium levels.

## 2022-11-06 ENCOUNTER — Other Ambulatory Visit (INDEPENDENT_AMBULATORY_CARE_PROVIDER_SITE_OTHER): Payer: Medicare HMO

## 2022-11-06 DIAGNOSIS — E871 Hypo-osmolality and hyponatremia: Secondary | ICD-10-CM

## 2022-11-06 LAB — BASIC METABOLIC PANEL
BUN: 17 mg/dL (ref 6–23)
CO2: 28 mEq/L (ref 19–32)
Calcium: 9 mg/dL (ref 8.4–10.5)
Chloride: 99 mEq/L (ref 96–112)
Creatinine, Ser: 0.86 mg/dL (ref 0.40–1.20)
GFR: 64.95 mL/min (ref >60.00)
Glucose, Bld: 67 mg/dL — ABNORMAL LOW (ref 70–99)
Potassium: 4 mEq/L (ref 3.5–5.1)
Sodium: 133 mEq/L — ABNORMAL LOW (ref 135–145)

## 2022-11-07 ENCOUNTER — Telehealth: Payer: Self-pay

## 2022-11-07 NOTE — Telephone Encounter (Signed)
Patient returned office phone call and note was read. 

## 2022-11-07 NOTE — Telephone Encounter (Signed)
Lm for pt to cb re:  Notify - Erica Little  sodium improved.  Kidney function improved and ok.

## 2022-11-08 ENCOUNTER — Ambulatory Visit: Payer: Medicare HMO | Admitting: Internal Medicine

## 2022-11-08 ENCOUNTER — Encounter: Payer: Self-pay | Admitting: Internal Medicine

## 2022-11-08 VITALS — BP 114/70 | HR 70 | Temp 98.0°F | Resp 15 | Ht 59.0 in | Wt 96.2 lb

## 2022-11-08 DIAGNOSIS — I779 Disorder of arteries and arterioles, unspecified: Secondary | ICD-10-CM | POA: Diagnosis not present

## 2022-11-08 DIAGNOSIS — Z8679 Personal history of other diseases of the circulatory system: Secondary | ICD-10-CM

## 2022-11-08 DIAGNOSIS — I10 Essential (primary) hypertension: Secondary | ICD-10-CM

## 2022-11-08 DIAGNOSIS — I700 Atherosclerosis of aorta: Secondary | ICD-10-CM

## 2022-11-08 DIAGNOSIS — Z9889 Other specified postprocedural states: Secondary | ICD-10-CM | POA: Diagnosis not present

## 2022-11-08 DIAGNOSIS — R739 Hyperglycemia, unspecified: Secondary | ICD-10-CM

## 2022-11-08 DIAGNOSIS — J4489 Other specified chronic obstructive pulmonary disease: Secondary | ICD-10-CM | POA: Diagnosis not present

## 2022-11-08 DIAGNOSIS — D649 Anemia, unspecified: Secondary | ICD-10-CM

## 2022-11-08 DIAGNOSIS — R918 Other nonspecific abnormal finding of lung field: Secondary | ICD-10-CM

## 2022-11-08 DIAGNOSIS — J439 Emphysema, unspecified: Secondary | ICD-10-CM

## 2022-11-08 DIAGNOSIS — M069 Rheumatoid arthritis, unspecified: Secondary | ICD-10-CM

## 2022-11-08 DIAGNOSIS — E871 Hypo-osmolality and hyponatremia: Secondary | ICD-10-CM

## 2022-11-08 DIAGNOSIS — E7800 Pure hypercholesterolemia, unspecified: Secondary | ICD-10-CM

## 2022-11-08 NOTE — Progress Notes (Unsigned)
Patient ID: Erica Little, female   DOB: 20-Oct-1946, 76 y.o.   MRN: [id number]   Subjective:    Patient ID: Erica Little, female    DOB: 12/22/1946, 76 y.o.   MRN: [id number]    Patient here for  Chief Complaint  Patient presents with   Follow-up   .   HPI Work in to discuss sodium levels. Continues on MTX and orencia - RA.  Seeing rheumatology.  CT chest - f/u.    Past Medical History:  Diagnosis Date   Family history of adverse reaction to anesthesia    Mother - PONV   Family history of brain aneurysm    GERD (gastroesophageal reflux disease)    Hyperlipidemia    Hypertension    MRSA (methicillin resistant Staphylococcus aureus)    skin infections   Rheumatoid arthritis(714.0)    transaminitis on MTX, remicade rxn, s/p sulfasalazine, orencia   Ulcer disease    Past Surgical History:  Procedure Laterality Date   CATARACT EXTRACTION W/PHACO Left 01/22/2021   Procedure: CATARACT EXTRACTION PHACO AND INTRAOCULAR LENS PLACEMENT ([facility]) LEFT;  Surgeon: Nevada Crane, MD;  Location[facility];  Service: Ophthalmology;  Laterality: Left;  5.90 0:37.7   CATARACT EXTRACTION W/PHACO Right 02/05/2021   Procedure: CATARACT EXTRACTION PHACO AND INTRAOCULAR LENS PLACEMENT ([facility]) RIGHT 4.33 00:34.6;  Surgeon: Nevada Crane, MD;  Location: [facility];  Service: Ophthalmology;  Laterality: Right;   CHOLECYSTECTOMY  2010   INTRACRANIAL ANEURYSM REPAIR  05/29/2016   MCP replacements  2001   left   TUBAL LIGATION     Family History  Problem Relation Age of Onset   Heart disease Mother    Diabetes Mother    Hypercholesterolemia Mother    Heart disease Father    Heart disease Brother        MI age 75   Hypercholesterolemia Sister    Cancer Sister    Colon polyps Sister    Arthritis/Rheumatoid Paternal Aunt    Arthritis/Rheumatoid Paternal Uncle    Breast cancer Neg Hx    Social History   Socioeconomic History   Marital status: Widowed     Spouse name: Not on file   Number of children: 1   Years of education: Not on file   Highest education level: Not on file  Occupational History   Not on file  Tobacco Use   Smoking status: Former    Types: Cigarettes    Quit date: 12/30/1988    Years since quitting: 33.8   Smokeless tobacco: Never  Vaping Use   Vaping Use: Never used  Substance and Sexual Activity   Alcohol use: No    Alcohol/week: 0.0 standard drinks of alcohol   Drug use: No   Sexual activity: Yes  Other Topics Concern   Not on file  Social History Narrative   Enjoys dancing    Social Determinants of Health   Financial Resource Strain: Low Risk  (02/19/2022)   Overall Financial Resource Strain (CARDIA)    Difficulty of Paying Living Expenses: Not very hard  Recent Concern: Financial Resource Strain - Medium Risk (01/09/2022)   Overall Financial Resource Strain (CARDIA)    Difficulty of Paying Living Expenses: Somewhat hard  Food Insecurity: No Food Insecurity (11/29/2021)   Hunger Vital Sign    Worried About Running Out of Food in the Last Year: Never true    Ran Out of Food in the Last Year: Never true  Transportation Needs: No Transportation  Needs (11/29/2021)   PRAPARE - Administrator, Civil Service (Medical): No    Lack of Transportation (Non-Medical): No  Physical Activity: Sufficiently Active (11/29/2021)   Exercise Vital Sign    Days of Exercise per Week: 5 days    Minutes of Exercise per Session: 30 min  Stress: No Stress Concern Present (11/29/2021)   [employer] of Occupational Health - Occupational Stress Questionnaire    Feeling of Stress : Not at all  Social Connections: Unknown (11/29/2021)   Social Connection and Isolation Panel [NHANES]    Frequency of Communication with Friends and Family: More than three times a week    Frequency of Social Gatherings with Friends and Family: More than three times a week    Attends Religious Services: Not on file    Active Member of  Clubs or Organizations: Not on file    Attends Banker Meetings: Not on file    Marital Status: Widowed     Review of Systems     Objective:     BP 114/70 (BP Location: Left Arm, Patient Position: Sitting, Cuff Size: Small)   Pulse 70   Temp 98 F (36.7 C) (Temporal)   Resp 15   Ht 4\' 11"  (1.499 m)   Wt 96 lb 3.2 oz (43.6 kg)   SpO2 98%   BMI 19.43 kg/m  Wt Readings from Last 3 Encounters:  11/08/22 96 lb 3.2 oz (43.6 kg)  08/13/22 99 lb (44.9 kg)  05/30/22 98 lb 3.2 oz (44.5 kg)    Physical Exam   Outpatient Encounter Medications as of 11/08/2022  Medication Sig   abatacept (ORENCIA) 250 MG injection Once every month   amLODipine (NORVASC) 2.5 MG tablet TAKE 1 TABLET BY MOUTH EVERY DAY   aspirin 81 MG chewable tablet Chew 81 mg by mouth daily.   calcium gluconate 650 MG tablet Take 650 mg by mouth daily.   carbamide peroxide (DEBROX) 6.5 % OTIC solution 5 drops in right ear q day.  Massage for approximately 5 minutes.   cholecalciferol (VITAMIN D) 1000 UNITS tablet Take 1,000 Units by mouth daily.   ELDERBERRY PO Take by mouth daily.   folic acid (FOLVITE) 1 MG tablet Take 1 mg by mouth daily. (Except MTX days)   lovastatin (MEVACOR) 10 MG tablet Take 1 tablet (10 mg total) by mouth at bedtime.   magnesium oxide (MAG-OX) 400 MG tablet Take 400 mg by mouth daily.   methotrexate 50 MG/2ML injection INJECT 0.2MLS SUBCUTANEOUSLY WEEKLY DISPENSE 2 VIALS   pantoprazole (PROTONIX) 40 MG tablet TAKE 1 TABLET BY MOUTH EVERY DAY   telmisartan (MICARDIS) 40 MG tablet TAKE 1 TABLET BY MOUTH EVERY DAY   Triamcinolone Acetonide (NASACORT AQ NA) Place into the nose as needed.   valACYclovir (VALTREX) 1000 MG tablet TAKE 1 TABLET BY MOUTH EVERY DAY   No facility-administered encounter medications on file as of 11/08/2022.     Lab Results  Component Value Date   WBC 5.9 11/26/2021   HGB 12.6 11/26/2021   HCT 38.9 11/26/2021   PLT 206.0 11/26/2021   GLUCOSE 67  (L) 11/06/2022   CHOL 184 10/03/2022   TRIG 109.0 10/03/2022   HDL 48.80 10/03/2022   LDLDIRECT 167.8 12/20/2013   LDLCALC 113 (H) 10/03/2022   ALT 10 10/03/2022   AST 19 10/03/2022   NA 133 (L) 11/06/2022   K 4.0 11/06/2022   CL 99 11/06/2022   CREATININE 0.86 11/06/2022   BUN 17  11/06/2022   CO2 28 11/06/2022   TSH 2.79 10/03/2022   INR 1.1 (H) 12/06/2013   HGBA1C 6.0 10/03/2022    MM 3D SCREEN BREAST BILATERAL  Result Date: 09/04/2022 CLINICAL DATA:  Screening. EXAM: DIGITAL SCREENING BILATERAL MAMMOGRAM WITH TOMOSYNTHESIS AND CAD TECHNIQUE: Bilateral screening digital craniocaudal and mediolateral oblique mammograms were obtained. Bilateral screening digital breast tomosynthesis was performed. The images were evaluated with computer-aided detection. COMPARISON:  Previous exam(s). ACR Breast Density Category b: There are scattered areas of fibroglandular density. FINDINGS: There are no findings suspicious for malignancy. IMPRESSION: No mammographic evidence of malignancy. A result letter of this screening mammogram will be mailed directly to the patient. RECOMMENDATION: Screening mammogram in one year. (Code:SM-B-01Y) BI-RADS CATEGORY  1: Negative. Electronically Signed   By: Gerome Sam III M.D.   On: 09/04/2022 16:56       Assessment & Plan:   Problem List Items Addressed This Visit   None    Dale [place], MD

## 2022-11-09 ENCOUNTER — Encounter: Payer: Self-pay | Admitting: Internal Medicine

## 2022-11-09 DIAGNOSIS — D649 Anemia, unspecified: Secondary | ICD-10-CM | POA: Insufficient documentation

## 2022-11-09 NOTE — Assessment & Plan Note (Signed)
Continue micardis and amlodipine. Continue to spot check pressures.  Follow metabolic panel.  

## 2022-11-09 NOTE — Assessment & Plan Note (Signed)
On MTX and orencia.  Stable.  Followed by rheumatology.  Just evaluated yesterday.

## 2022-11-09 NOTE — Assessment & Plan Note (Signed)
Has seen Dr Gonzalez.  Breathing stable.  Follow.  

## 2022-11-09 NOTE — Assessment & Plan Note (Addendum)
On qod lovastatin and tolerating.  Follow.  

## 2022-11-09 NOTE — Assessment & Plan Note (Signed)
Intolerant to praluent.  Feels better off.  Lovastatin 10mg q day.  Tolerates.  Follow lipid panel and liver function tests.   

## 2022-11-09 NOTE — Assessment & Plan Note (Signed)
Follow met b and a1c.  

## 2022-11-09 NOTE — Assessment & Plan Note (Signed)
Carotid ultrasound 10/2021- Dr Schnier - recommended f/u carotid ultrasound in 2 years.  

## 2022-11-09 NOTE — Assessment & Plan Note (Signed)
Followed by pulmonary.  Had f/u CT scan 10/2020.   Stable.  Recommended f/u in 2 years.   

## 2022-11-09 NOTE — Assessment & Plan Note (Signed)
S/p stenting.  Being followed at [facility]. On aspirin.   Last evaluated 05/2022.  MRA  06/12/22 - ok.  Recommended continuing aspirin.  F/u in 3 years.  

## 2022-11-09 NOTE — Assessment & Plan Note (Signed)
Slight decreased hgb last lab.  Recheck cbc with iron studies and B12 next lab.

## 2022-11-09 NOTE — Assessment & Plan Note (Signed)
Has seen nephrology.  Discussed diet with her today. She is eating better.  Has decreased free fluid intake slightly.  Recent sodium improved 133.  Follow.

## 2022-12-03 ENCOUNTER — Ambulatory Visit (INDEPENDENT_AMBULATORY_CARE_PROVIDER_SITE_OTHER): Payer: Medicare HMO

## 2022-12-03 VITALS — Ht 59.0 in | Wt 96.0 lb

## 2022-12-03 DIAGNOSIS — Z0000 Encounter for general adult medical examination without abnormal findings: Secondary | ICD-10-CM

## 2022-12-03 NOTE — Patient Instructions (Addendum)
Erica Little , Thank you for taking time to come for your Medicare Wellness Visit. I appreciate your ongoing commitment to your health goals. Please review the following plan we discussed and let me know if I can assist you in the future.   These are the goals we discussed:  Goals      Follow up with Primary Care Provider     As needed.        This is a list of the screening recommended for you and due dates:  Health Maintenance  Topic Date Due   COVID-19 Vaccine (4 - 2023-24 season) 12/19/2022*   Zoster (Shingles) Vaccine (1 of 2) 03/04/2023*   Flu Shot  03/30/2023*   Colon Cancer Screening  12/04/2023*   Hepatitis C Screening: USPSTF Recommendation to screen - Ages 18-79 yo.  12/04/2023*   Mammogram  09/05/2023   Medicare Annual Wellness Visit  12/04/2023   Pneumonia Vaccine  Completed   DEXA scan (bone density measurement)  Completed   HPV Vaccine  Aged Out   DTaP/Tdap/Td vaccine  Discontinued  *Topic was postponed. The date shown is not the original due date.    Advanced directives: End of life planning; Advance aging; Advanced directives discussed.  Copy of current HCPOA/Living Will requested.    Conditions/risks identified: none new.  Next appointment: Follow up in one year for your annual wellness visit.   Preventive Care 78 Years and Older, Female Preventive care refers to lifestyle choices and visits with your health care provider that can promote health and wellness. What does preventive care include? A yearly physical exam. This is also called an annual well check. Dental exams once or twice a year. Routine eye exams. Ask your health care provider how often you should have your eyes checked. Personal lifestyle choices, including: Daily care of your teeth and gums. Regular physical activity. Eating a healthy diet. Avoiding tobacco and drug use. Limiting alcohol use. Practicing safe sex. Taking low-dose aspirin every day. Taking vitamin and mineral supplements  as recommended by your health care provider. What happens during an annual well check? The services and screenings done by your health care provider during your annual well check will depend on your age, overall health, lifestyle risk factors, and family history of disease. Counseling  Your health care provider may ask you questions about your: Alcohol use. Tobacco use. Drug use. Emotional well-being. Home and relationship well-being. Sexual activity. Eating habits. History of falls. Memory and ability to understand (cognition). Work and work Astronomer. Reproductive health. Screening  You may have the following tests or measurements: Height, weight, and BMI. Blood pressure. Lipid and cholesterol levels. These may be checked every 5 years, or more frequently if you are over 37 years old. Skin check. Lung cancer screening. You may have this screening every year starting at age 78 if you have a 30-pack-year history of smoking and currently smoke or have quit within the past 15 years. Fecal occult blood test (FOBT) of the stool. You may have this test every year starting at age 78. Flexible sigmoidoscopy or colonoscopy. You may have a sigmoidoscopy every 5 years or a colonoscopy every 10 years starting at age 78. Hepatitis C blood test. Hepatitis B blood test. Sexually transmitted disease (STD) testing. Diabetes screening. This is done by checking your blood sugar (glucose) after you have not eaten for a while (fasting). You may have this done every 1-3 years. Bone density scan. This is done to screen for osteoporosis. You may have  this done starting at age 11. Mammogram. This may be done every 1-2 years. Talk to your health care provider about how often you should have regular mammograms. Talk with your health care provider about your test results, treatment options, and if necessary, the need for more tests. Vaccines  Your health care provider may recommend certain vaccines, such  as: Influenza vaccine. This is recommended every year. Tetanus, diphtheria, and acellular pertussis (Tdap, Td) vaccine. You may need a Td booster every 10 years. Zoster vaccine. You may need this after age 30. Pneumococcal 13-valent conjugate (PCV13) vaccine. One dose is recommended after age 78. Pneumococcal polysaccharide (PPSV23) vaccine. One dose is recommended after age 78. Talk to your health care provider about which screenings and vaccines you need and how often you need them. This information is not intended to replace advice given to you by your health care provider. Make sure you discuss any questions you have with your health care provider. Document Released: 01/12/2016 Document Revised: 09/04/2016 Document Reviewed: 10/17/2015 Elsevier Interactive Patient Education  2017 [place] Prevention in the Home Falls can cause injuries. They can happen to people of all ages. There are many things you can do to make your home safe and to help prevent falls. What can I do on the outside of my home? Regularly fix the edges of walkways and driveways and fix any cracks. Remove anything that might make you trip as you walk through a door, such as a raised step or threshold. Trim any bushes or trees on the path to your home. Use bright outdoor lighting. Clear any walking paths of anything that might make someone trip, such as rocks or tools. Regularly check to see if handrails are loose or broken. Make sure that both sides of any steps have handrails. Any raised decks and porches should have guardrails on the edges. Have any leaves, snow, or ice cleared regularly. Use sand or salt on walking paths during winter. Clean up any spills in your garage right away. This includes oil or grease spills. What can I do in the bathroom? Use night lights. Install grab bars by the toilet and in the tub and shower. Do not use towel bars as grab bars. Use non-skid mats or decals in the tub or  shower. If you need to sit down in the shower, use a plastic, non-slip stool. Keep the floor dry. Clean up any water that spills on the floor as soon as it happens. Remove soap buildup in the tub or shower regularly. Attach bath mats securely with double-sided non-slip rug tape. Do not have throw rugs and other things on the floor that can make you trip. What can I do in the bedroom? Use night lights. Make sure that you have a light by your bed that is easy to reach. Do not use any sheets or blankets that are too big for your bed. They should not hang down onto the floor. Have a firm chair that has side arms. You can use this for support while you get dressed. Do not have throw rugs and other things on the floor that can make you trip. What can I do in the kitchen? Clean up any spills right away. Avoid walking on wet floors. Keep items that you use a lot in easy-to-reach places. If you need to reach something above you, use a strong step stool that has a grab bar. Keep electrical cords out of the way. Do not use floor polish or  wax that makes floors slippery. If you must use wax, use non-skid floor wax. Do not have throw rugs and other things on the floor that can make you trip. What can I do with my stairs? Do not leave any items on the stairs. Make sure that there are handrails on both sides of the stairs and use them. Fix handrails that are broken or loose. Make sure that handrails are as long as the stairways. Check any carpeting to make sure that it is firmly attached to the stairs. Fix any carpet that is loose or worn. Avoid having throw rugs at the top or bottom of the stairs. If you do have throw rugs, attach them to the floor with carpet tape. Make sure that you have a light switch at the top of the stairs and the bottom of the stairs. If you do not have them, ask someone to add them for you. What else can I do to help prevent falls? Wear shoes that: Do not have high heels. Have  rubber bottoms. Are comfortable and fit you well. Are closed at the toe. Do not wear sandals. If you use a stepladder: Make sure that it is fully opened. Do not climb a closed stepladder. Make sure that both sides of the stepladder are locked into place. Ask someone to hold it for you, if possible. Clearly mark and make sure that you can see: Any grab bars or handrails. First and last steps. Where the edge of each step is. Use tools that help you move around (mobility aids) if they are needed. These include: Canes. Walkers. Scooters. Crutches. Turn on the lights when you go into a dark area. Replace any light bulbs as soon as they burn out. Set up your furniture so you have a clear path. Avoid moving your furniture around. If any of your floors are uneven, fix them. If there are any pets around you, be aware of where they are. Review your medicines with your doctor. Some medicines can make you feel dizzy. This can increase your chance of falling. Ask your doctor what other things that you can do to help prevent falls. This information is not intended to replace advice given to you by your health care provider. Make sure you discuss any questions you have with your health care provider. Document Released: 10/12/2009 Document Revised: 05/23/2016 Document Reviewed: 01/20/2015 Elsevier Interactive Patient Education  2017 [employer].

## 2022-12-03 NOTE — Progress Notes (Signed)
Subjective:   Erica Little is a 76 y.o. female who presents for Medicare Annual (Subsequent) preventive examination.  Review of Systems    No ROS.  Medicare Wellness Virtual Visit.  Visual/audio telehealth visit, UTA vital signs.   See social history for additional risk factors.   Cardiac Risk Factors include: advanced age (>26men, >95 women);hypertension     Objective:    Today's Vitals   12/03/22 0844  Weight: 96 lb (43.5 kg)  Height: 4\' 11"  (1.499 m)   Body mass index is 19.39 kg/m.     12/03/2022    8:48 AM 11/29/2021   10:48 AM 02/05/2021   12:28 PM 01/22/2021   11:02 AM 11/28/2020   10:41 AM 11/24/2019   10:56 AM 11/17/2018   11:15 AM  Advanced Directives  Does Patient Have a Medical Advance Directive? Yes Yes  Yes Yes Yes Yes  Type of 11/19/2018 of [place];Living will Healthcare Power of [place];Living will Living will Healthcare Power of [place];Living will Healthcare Power of [place];Living will Healthcare Power of [place];Living will Healthcare Power of [place];Living will  Does patient want to make changes to medical advance directive? No - Patient declined No - Patient declined  No - Patient declined No - Patient declined No - Patient declined No - Patient declined  Copy of Healthcare Power of Attorney in Chart? No - copy requested No - copy requested Yes - validated most recent copy scanned in chart (See row information) No - copy requested No - copy requested No - copy requested No - copy requested    Current Medications (verified) Outpatient Encounter Medications as of 12/03/2022  Medication Sig   abatacept (ORENCIA) 250 MG injection Once every month   amLODipine (NORVASC) 2.5 MG tablet TAKE 1 TABLET BY MOUTH EVERY DAY   aspirin 81 MG chewable tablet Chew 81 mg by mouth daily.   calcium gluconate 650 MG tablet Take 650 mg by mouth daily.   carbamide peroxide (DEBROX) 6.5 % OTIC solution 5 drops in right ear q day.  Massage  for approximately 5 minutes.   cholecalciferol (VITAMIN D) 1000 UNITS tablet Take 1,000 Units by mouth daily.   ELDERBERRY PO Take by mouth daily.   folic acid (FOLVITE) 1 MG tablet Take 1 mg by mouth daily. (Except MTX days)   lovastatin (MEVACOR) 10 MG tablet Take 1 tablet (10 mg total) by mouth at bedtime.   magnesium oxide (MAG-OX) 400 MG tablet Take 400 mg by mouth daily.   methotrexate 50 MG/2ML injection INJECT 0.2MLS SUBCUTANEOUSLY WEEKLY DISPENSE 2 VIALS   pantoprazole (PROTONIX) 40 MG tablet TAKE 1 TABLET BY MOUTH EVERY DAY   telmisartan (MICARDIS) 40 MG tablet TAKE 1 TABLET BY MOUTH EVERY DAY   Triamcinolone Acetonide (NASACORT AQ NA) Place into the nose as needed.   valACYclovir (VALTREX) 1000 MG tablet TAKE 1 TABLET BY MOUTH EVERY DAY   No facility-administered encounter medications on file as of 12/03/2022.    Allergies (verified) Oxycodone, Remicade [infliximab], Simvastatin, Sulfa antibiotics, Zetia [ezetimibe], Latex, and Tape   History: Past Medical History:  Diagnosis Date   Family history of adverse reaction to anesthesia    Mother - PONV   Family history of brain aneurysm    GERD (gastroesophageal reflux disease)    Hyperlipidemia    Hypertension    MRSA (methicillin resistant Staphylococcus aureus)    skin infections   Rheumatoid arthritis(714.0)    transaminitis on MTX, remicade rxn, s/p sulfasalazine, orencia  Ulcer disease    Past Surgical History:  Procedure Laterality Date   CATARACT EXTRACTION W/PHACO Left 01/22/2021   Procedure: CATARACT EXTRACTION PHACO AND INTRAOCULAR LENS PLACEMENT (IOC) LEFT;  Surgeon: Nevada Crane, MD;  Location: [facility];  Service: Ophthalmology;  Laterality: Left;  5.90 0:37.7   CATARACT EXTRACTION W/PHACO Right 02/05/2021   Procedure: CATARACT EXTRACTION PHACO AND INTRAOCULAR LENS PLACEMENT (IOC) RIGHT 4.33 00:34.6;  Surgeon: Nevada Crane, MD;  Location: [facility];  Service: Ophthalmology;   Laterality: Right;   CHOLECYSTECTOMY  2010   INTRACRANIAL ANEURYSM REPAIR  05/29/2016   MCP replacements  2001   left   TUBAL LIGATION     Family History  Problem Relation Age of Onset   Heart disease Mother    Diabetes Mother    Hypercholesterolemia Mother    Heart disease Father    Heart disease Brother        MI age 100   Hypercholesterolemia Sister    Cancer Sister    Colon polyps Sister    Arthritis/Rheumatoid Paternal Aunt    Arthritis/Rheumatoid Paternal Uncle    Breast cancer Neg Hx    Social History   Socioeconomic History   Marital status: Widowed    Spouse name: Not on file   Number of children: 1   Years of education: Not on file   Highest education level: Not on file  Occupational History   Not on file  Tobacco Use   Smoking status: Former    Types: Cigarettes    Quit date: 12/30/1988    Years since quitting: 33.9   Smokeless tobacco: Never  Vaping Use   Vaping Use: Never used  Substance and Sexual Activity   Alcohol use: No    Alcohol/week: 0.0 standard drinks of alcohol   Drug use: No   Sexual activity: Yes  Other Topics Concern   Not on file  Social History Narrative   Enjoys dancing    Social Determinants of Health   Financial Resource Strain: Low Risk  (12/03/2022)   Overall Financial Resource Strain (CARDIA)    Difficulty of Paying Living Expenses: Not hard at all  Food Insecurity: No Food Insecurity (12/03/2022)   Hunger Vital Sign    Worried About Running Out of Food in the Last Year: Never true    Ran Out of Food in the Last Year: Never true  Transportation Needs: No Transportation Needs (12/03/2022)   PRAPARE - Administrator, Civil Service (Medical): No    Lack of Transportation (Non-Medical): No  Physical Activity: Sufficiently Active (12/03/2022)   Exercise Vital Sign    Days of Exercise per Week: 5 days    Minutes of Exercise per Session: 30 min  Stress: No Stress Concern Present (12/03/2022)   [employer] of  Occupational Health - Occupational Stress Questionnaire    Feeling of Stress : Not at all  Social Connections: Unknown (12/03/2022)   Social Connection and Isolation Panel [NHANES]    Frequency of Communication with Friends and Family: More than three times a week    Frequency of Social Gatherings with Friends and Family: More than three times a week    Attends Religious Services: Not on file    Active Member of Clubs or Organizations: Not on file    Attends Banker Meetings: Not on file    Marital Status: Widowed    Tobacco Counseling Counseling given: Not Answered   Clinical Intake:  Pre-visit preparation completed:  Yes        Diabetes: No  How often do you need to have someone help you when you read instructions, pamphlets, or other written materials from your doctor or pharmacy?: 1 - Never    Interpreter Needed?: No      Activities of Daily Living    12/03/2022    8:37 AM  In your present state of health, do you have any difficulty performing the following activities:  Hearing? 0  Vision? 0  Difficulty concentrating or making decisions? 0  Walking or climbing stairs? 0  Dressing or bathing? 0  Doing errands, shopping? 0  Preparing Food and eating ? N  Using the Toilet? N  In the past six months, have you accidently leaked urine? N  Do you have problems with loss of bowel control? N  Managing your Medications? N  Managing your Finances? N  Housekeeping or managing your Housekeeping? N    Patient Care Team: Dale Bluffton, MD as PCP - General (Internal Medicine)  Indicate any recent Medical Services you may have received from other than [facility] providers in the past year (date may be approximate).     Assessment:   This is a routine wellness examination for Synthia.  I connected with  Charlann Noss on 12/03/22 by a audio enabled telemedicine application and verified that I am speaking with the correct person using two  identifiers.  Patient Location: Home  Provider Location: Office/Clinic  I discussed the limitations of evaluation and management by telemedicine. The patient expressed understanding and agreed to proceed.   Hearing/Vision screen Hearing Screening - Comments:: Patient is able to hear conversational tones without difficulty. No issues reported. Vision Screening - Comments:: Followed by [facility] Cataracts extracted, bilateral They have regular follow up with the ophthalmologist   Dietary issues and exercise activities discussed: Current Exercise Habits: Home exercise routine, Type of exercise: stretching, Frequency (Times/Week): 7, Intensity: Mild Healthy diet Good water intake   Goals Addressed             This Visit's Progress    Follow up with Primary Care Provider       As needed.       Depression Screen    12/03/2022    8:36 AM 11/08/2022    9:19 AM 08/13/2022   10:42 AM 05/30/2022   10:50 AM 04/11/2022    1:35 PM 11/29/2021   10:41 AM 11/28/2020   10:40 AM  PHQ 2/9 Scores  PHQ - 2 Score 0 0 0 0 0 0 0    Fall Risk    12/03/2022    8:35 AM 11/08/2022    9:19 AM 08/13/2022   10:41 AM 05/30/2022   10:50 AM 04/11/2022    1:35 PM  Fall Risk   Falls in the past year? 0 0 0 0 0  Number falls in past yr: 0 0 0    Injury with Fall? 0 0 0    Risk for fall due to : No Fall Risks No Fall Risks No Fall Risks No Fall Risks No Fall Risks  Follow up Falls evaluation completed;Falls prevention discussed Falls evaluation completed Falls evaluation completed Falls evaluation completed Falls evaluation completed    FALL RISK PREVENTION PERTAINING TO THE HOME: Home free of loose throw rugs in walkways, pet beds, electrical cords, etc? Yes  Adequate lighting in your home to reduce risk of falls? Yes   ASSISTIVE DEVICES UTILIZED TO PREVENT FALLS: Life alert? No  Use  of a cane, walker or w/c? No  Grab bars in the bathroom? No  Shower chair or bench in shower? No   Elevated toilet seat or a handicapped toilet? No   TIMED UP AND GO: Was the test performed? No .   Cognitive Function:    11/06/2017   11:28 AM  MMSE - Mini Mental State Exam  Orientation to time 5  Orientation to Place 5  Registration 3  Attention/ Calculation 5  Recall 3  Language- name 2 objects 2  Language- repeat 1  Language- follow 3 step command 3  Language- read & follow direction 1  Write a sentence 1  Copy design 1  Total score 30        12/03/2022    8:53 AM 11/29/2021   11:02 AM 11/28/2020   10:44 AM 11/24/2019   10:54 AM 11/17/2018   12:13 PM  6CIT Screen  What Year? 0 points 0 points 0 points 0 points 0 points  What month? 0 points 0 points 0 points 0 points 0 points  What time? 0 points 0 points 0 points 0 points 0 points  Count back from 20 0 points 0 points 0 points 0 points 0 points  Months in reverse 0 points 0 points 0 points 0 points 0 points  Repeat phrase 0 points 0 points 0 points 0 points 0 points  Total Score 0 points 0 points 0 points 0 points 0 points    Immunizations Immunization History  Administered Date(s) Administered   PFIZER(Purple Top)SARS-COV-2 Vaccination 01/25/2020, 02/15/2020, 12/10/2020   Pneumococcal Conjugate-13 11/25/2017   Pneumococcal Polysaccharide-23 12/10/2018   TDAP status: Due, Education has been provided regarding the importance of this vaccine. Advised may receive this vaccine at local pharmacy or Health Dept. Aware to provide a copy of the vaccination record if obtained from local pharmacy or Health Dept. Verbalized acceptance and understanding.  Shingrix Completed?: No.    Education has been provided regarding the importance of this vaccine. Patient has been advised to call insurance company to determine out of pocket expense if they have not yet received this vaccine. Advised may also receive vaccine at local pharmacy or Health Dept. Verbalized acceptance and understanding.  Screening Tests Health Maintenance   Topic Date Due   COVID-19 Vaccine (4 - 2023-24 season) 12/19/2022 (Originally 08/30/2022)   Zoster Vaccines- Shingrix (1 of 2) 03/04/2023 (Originally 12/21/1965)   INFLUENZA VACCINE  03/30/2023 (Originally 07/30/2022)   COLONOSCOPY (Pts 45-27yrs Insurance coverage will need to be confirmed)  12/04/2023 (Originally 09/24/2019)   Hepatitis C Screening  12/04/2023 (Originally 12/20/1964)   MAMMOGRAM  09/05/2023   Medicare Annual Wellness (AWV)  12/04/2023   Pneumonia Vaccine 68+ Years old  Completed   DEXA SCAN  Completed   HPV VACCINES  Aged Out   DTaP/Tdap/Td  Discontinued    Health Maintenance There are no preventive care reminders to display for this patient.  Colonoscopy- deferred per patient.  Lung Cancer Screening: (Low Dose CT Chest recommended if Age 35-80 years, 30 pack-year currently smoking OR have quit w/in 15years.) does not qualify.   Hepatitis C Screening: deferred per patient.   Vision Screening: Recommended annual ophthalmology exams for early detection of glaucoma and other disorders of the eye.  Dental Screening: Recommended annual dental exams for proper oral hygiene.  Community Resource Referral / Chronic Care Management: CRR required this visit?  No   CCM required this visit?  No      Plan:  I have personally reviewed and noted the following in the patient's chart:   Medical and social history Use of alcohol, tobacco or illicit drugs  Current medications and supplements including opioid prescriptions. Patient is not currently taking opioid prescriptions. Functional ability and status Nutritional status Physical activity Advanced directives List of other physicians Hospitalizations, surgeries, and ER visits in previous 12 months Vitals Screenings to include cognitive, depression, and falls Referrals and appointments  In addition, I have reviewed and discussed with patient certain preventive protocols, quality metrics, and best practice  recommendations. A written personalized care plan for preventive services as well as general preventive health recommendations were provided to patient.     Cathey Endow, LPN   16/12/958

## 2022-12-10 ENCOUNTER — Other Ambulatory Visit: Payer: Medicare HMO

## 2022-12-13 ENCOUNTER — Ambulatory Visit: Payer: Medicare HMO | Admitting: Internal Medicine

## 2023-01-27 ENCOUNTER — Other Ambulatory Visit: Payer: Self-pay | Admitting: Internal Medicine

## 2023-01-27 DIAGNOSIS — E7800 Pure hypercholesterolemia, unspecified: Secondary | ICD-10-CM

## 2023-01-27 DIAGNOSIS — T466X5A Adverse effect of antihyperlipidemic and antiarteriosclerotic drugs, initial encounter: Secondary | ICD-10-CM

## 2023-02-10 ENCOUNTER — Other Ambulatory Visit (INDEPENDENT_AMBULATORY_CARE_PROVIDER_SITE_OTHER): Payer: Medicare HMO

## 2023-02-10 DIAGNOSIS — E7800 Pure hypercholesterolemia, unspecified: Secondary | ICD-10-CM

## 2023-02-10 DIAGNOSIS — I10 Essential (primary) hypertension: Secondary | ICD-10-CM | POA: Diagnosis not present

## 2023-02-10 DIAGNOSIS — R739 Hyperglycemia, unspecified: Secondary | ICD-10-CM

## 2023-02-10 DIAGNOSIS — D649 Anemia, unspecified: Secondary | ICD-10-CM | POA: Diagnosis not present

## 2023-02-10 LAB — CBC WITH DIFFERENTIAL/PLATELET
Basophils Absolute: 0.1 10*3/uL (ref 0.0–0.1)
Basophils Relative: 1.2 % (ref 0.0–3.0)
Eosinophils Absolute: 0.4 10*3/uL (ref 0.0–0.7)
Eosinophils Relative: 6.9 % — ABNORMAL HIGH (ref 0.0–5.0)
HCT: 39.8 % (ref 36.0–46.0)
Hemoglobin: 13.3 g/dL (ref 12.0–15.0)
Lymphocytes Relative: 26 % (ref 12.0–46.0)
Lymphs Abs: 1.4 10*3/uL (ref 0.7–4.0)
MCHC: 33.3 g/dL (ref 30.0–36.0)
MCV: 90.1 fl (ref 78.0–100.0)
Monocytes Absolute: 0.5 10*3/uL (ref 0.1–1.0)
Monocytes Relative: 9 % (ref 3.0–12.0)
Neutro Abs: 3.1 10*3/uL (ref 1.4–7.7)
Neutrophils Relative %: 56.9 % (ref 43.0–77.0)
Platelets: 200 10*3/uL (ref 150.0–400.0)
RBC: 4.42 Mil/uL (ref 3.87–5.11)
RDW: 14.9 % (ref 11.5–15.5)
WBC: 5.5 10*3/uL (ref 4.0–10.5)

## 2023-02-10 LAB — HEPATIC FUNCTION PANEL
ALT: 11 U/L (ref 0–35)
AST: 18 U/L (ref 0–37)
Albumin: 4.4 g/dL (ref 3.5–5.2)
Alkaline Phosphatase: 81 U/L (ref 39–117)
Bilirubin, Direct: 0.1 mg/dL (ref 0.0–0.3)
Total Bilirubin: 0.7 mg/dL (ref 0.2–1.2)
Total Protein: 6.3 g/dL (ref 6.0–8.3)

## 2023-02-10 LAB — BASIC METABOLIC PANEL
BUN: 21 mg/dL (ref 6–23)
CO2: 26 mEq/L (ref 19–32)
Calcium: 9.5 mg/dL (ref 8.4–10.5)
Chloride: 100 mEq/L (ref 96–112)
Creatinine, Ser: 0.92 mg/dL (ref 0.40–1.20)
GFR: 59.79 mL/min — ABNORMAL LOW (ref >60.00)
Glucose, Bld: 77 mg/dL (ref 70–99)
Potassium: 4.2 mEq/L (ref 3.5–5.1)
Sodium: 135 mEq/L (ref 135–145)

## 2023-02-10 LAB — VITAMIN B12: Vitamin B-12: 200 pg/mL — ABNORMAL LOW (ref 211–911)

## 2023-02-10 LAB — LIPID PANEL
Cholesterol: 220 mg/dL — ABNORMAL HIGH (ref 0–200)
HDL: 58.4 mg/dL (ref >39.00)
LDL Cholesterol: 145 mg/dL — ABNORMAL HIGH (ref 0–99)
NonHDL: 161.56
Total CHOL/HDL Ratio: 4
Triglycerides: 85 mg/dL (ref 0.0–149.0)
VLDL: 17 mg/dL (ref 0.0–40.0)

## 2023-02-10 LAB — IBC + FERRITIN
Ferritin: 23.7 ng/mL (ref 10.0–291.0)
Iron: 46 ug/dL (ref 42–145)
Saturation Ratios: 12.4 % — ABNORMAL LOW (ref 20.0–50.0)
TIBC: 372.4 ug/dL (ref 250.0–450.0)
Transferrin: 266 mg/dL (ref 212.0–360.0)

## 2023-02-10 LAB — HEMOGLOBIN A1C: Hgb A1c MFr Bld: 5.8 % (ref 4.6–6.5)

## 2023-02-12 ENCOUNTER — Ambulatory Visit: Payer: Medicare HMO | Admitting: Internal Medicine

## 2023-02-12 ENCOUNTER — Encounter: Payer: Self-pay | Admitting: Internal Medicine

## 2023-02-12 VITALS — BP 126/74 | HR 62 | Temp 97.6°F | Resp 16 | Ht 59.0 in | Wt 96.4 lb

## 2023-02-12 DIAGNOSIS — I10 Essential (primary) hypertension: Secondary | ICD-10-CM | POA: Diagnosis not present

## 2023-02-12 DIAGNOSIS — J4489 Other specified chronic obstructive pulmonary disease: Secondary | ICD-10-CM

## 2023-02-12 DIAGNOSIS — E7800 Pure hypercholesterolemia, unspecified: Secondary | ICD-10-CM

## 2023-02-12 DIAGNOSIS — R918 Other nonspecific abnormal finding of lung field: Secondary | ICD-10-CM

## 2023-02-12 DIAGNOSIS — F439 Reaction to severe stress, unspecified: Secondary | ICD-10-CM

## 2023-02-12 DIAGNOSIS — M069 Rheumatoid arthritis, unspecified: Secondary | ICD-10-CM

## 2023-02-12 DIAGNOSIS — Z9889 Other specified postprocedural states: Secondary | ICD-10-CM

## 2023-02-12 DIAGNOSIS — D649 Anemia, unspecified: Secondary | ICD-10-CM

## 2023-02-12 DIAGNOSIS — R739 Hyperglycemia, unspecified: Secondary | ICD-10-CM

## 2023-02-12 DIAGNOSIS — E871 Hypo-osmolality and hyponatremia: Secondary | ICD-10-CM

## 2023-02-12 DIAGNOSIS — J439 Emphysema, unspecified: Secondary | ICD-10-CM

## 2023-02-12 DIAGNOSIS — E538 Deficiency of other specified B group vitamins: Secondary | ICD-10-CM

## 2023-02-12 DIAGNOSIS — I700 Atherosclerosis of aorta: Secondary | ICD-10-CM

## 2023-02-12 DIAGNOSIS — Z8679 Personal history of other diseases of the circulatory system: Secondary | ICD-10-CM

## 2023-02-12 DIAGNOSIS — I779 Disorder of arteries and arterioles, unspecified: Secondary | ICD-10-CM

## 2023-02-12 MED ORDER — "BD DISP NEEDLE 25G X 1"" MISC": 0 refills | Status: AC

## 2023-02-12 MED ORDER — CYANOCOBALAMIN 1000 MCG/ML IJ SOLN
1000.0000 ug | Freq: Once | INTRAMUSCULAR | Status: AC
  Administered 2023-02-12: 1000 ug via INTRAMUSCULAR

## 2023-02-12 MED ORDER — "SYRINGE/NEEDLE (DISP) 25G X 1"" 3 ML MISC": 0 refills | Status: AC

## 2023-02-12 MED ORDER — CYANOCOBALAMIN 1000 MCG/ML IJ SOLN: INTRAMUSCULAR | 0 refills | Status: DC

## 2023-02-12 NOTE — Progress Notes (Signed)
Subjective:    Patient ID: Erica Little, female    DOB: 03/16/1946, 77 y.o.   MRN: [id number]  Patient here for  Chief Complaint  Patient presents with   Medical Management of Chronic Issues    HPI Here to follow up regarding her cholesterol, hyponatremia, hypertension and RA.  Sees rheumatology.  On orencia and MTX. Joints overall stable.  Tries to stay active.  No chest pain or sob reported.  No increased cough or congestion.  Some nasal congestion. Discussed saline nasal spray.  No abdominal pain.  Seeing dentist - dental procedures.   Due f/u chest CT - f/u lung nodules.  Wants to schedule    Past Medical History:  Diagnosis Date   Family history of adverse reaction to anesthesia    Mother - PONV   Family history of brain aneurysm    GERD (gastroesophageal reflux disease)    Hyperlipidemia    Hypertension    MRSA (methicillin resistant Staphylococcus aureus)    skin infections   Rheumatoid arthritis(714.0)    transaminitis on MTX, remicade rxn, s/p sulfasalazine, orencia   Ulcer disease    Past Surgical History:  Procedure Laterality Date   CATARACT EXTRACTION W/PHACO Left 01/22/2021   Procedure: CATARACT EXTRACTION PHACO AND INTRAOCULAR LENS PLACEMENT ([place]) LEFT;  Surgeon: Eulogio Bear, MD;  Location: [place];  Service: Ophthalmology;  Laterality: Left;  5.90 0:37.7   CATARACT EXTRACTION W/PHACO Right 02/05/2021   Procedure: CATARACT EXTRACTION PHACO AND INTRAOCULAR LENS PLACEMENT ([facility]) RIGHT 4.33 00:34.6;  Surgeon: Eulogio Bear, MD;  Location: [facility];  Service: Ophthalmology;  Laterality: Right;   CHOLECYSTECTOMY  2010   INTRACRANIAL ANEURYSM REPAIR  05/29/2016   MCP replacements  2001   left   TUBAL LIGATION     Family History  Problem Relation Age of Onset   Heart disease Mother    Diabetes Mother    Hypercholesterolemia Mother    Heart disease Father    Heart disease Brother        MI age 104   Hypercholesterolemia  Sister    Cancer Sister    Colon polyps Sister    Arthritis/Rheumatoid Paternal Aunt    Arthritis/Rheumatoid Paternal Uncle    Breast cancer Neg Hx    Social History   Socioeconomic History   Marital status: Widowed    Spouse name: Not on file   Number of children: 1   Years of education: Not on file   Highest education level: Not on file  Occupational History   Not on file  Tobacco Use   Smoking status: Former    Types: Cigarettes    Quit date: 12/30/1988    Years since quitting: 34.1   Smokeless tobacco: Never  Vaping Use   Vaping Use: Never used  Substance and Sexual Activity   Alcohol use: No    Alcohol/week: 0.0 standard drinks of alcohol   Drug use: No   Sexual activity: Yes  Other Topics Concern   Not on file  Social History Narrative   Enjoys dancing    Social Determinants of Health   Financial Resource Strain: Low Risk  (12/03/2022)   Overall Financial Resource Strain (CARDIA)    Difficulty of Paying Living Expenses: Not hard at all  Food Insecurity: No Food Insecurity (12/03/2022)   Hunger Vital Sign    Worried About Running Out of Food in the Last Year: Never true    [place] in the Last  Year: Never true  Transportation Needs: No Transportation Needs (12/03/2022)   PRAPARE - Hydrologist (Medical): No    Lack of Transportation (Non-Medical): No  Physical Activity: Sufficiently Active (12/03/2022)   Exercise Vital Sign    Days of Exercise per Week: 5 days    Minutes of Exercise per Session: 30 min  Stress: No Stress Concern Present (12/03/2022)   [place]    Feeling of Stress : Not at all  Social Connections: Unknown (12/03/2022)   Social Connection and Isolation Panel [NHANES]    Frequency of Communication with Friends and Family: More than three times a week    Frequency of Social Gatherings with Friends and Family: More than three times a week     Attends Religious Services: Not on file    Active Member of Clubs or Organizations: Not on file    Attends Archivist Meetings: Not on file    Marital Status: Widowed     Review of Systems  Constitutional:  Negative for appetite change and unexpected weight change.  HENT:  Positive for congestion. Negative for sinus pressure.   Respiratory:  Negative for cough, chest tightness and shortness of breath.   Cardiovascular:  Negative for chest pain, palpitations and leg swelling.  Gastrointestinal:  Negative for abdominal pain, diarrhea, nausea and vomiting.  Genitourinary:  Negative for difficulty urinating and dysuria.  Musculoskeletal:  Negative for myalgias.       Joint issues as outlined - followed by rheumatology.   Skin:  Negative for color change and rash.  Neurological:  Negative for dizziness and headaches.  Psychiatric/Behavioral:  Negative for agitation and dysphoric mood.        Objective:     BP 126/74   Pulse 62   Temp 97.6 F (36.4 C)   Resp 16   Ht 4' 11"$  (1.499 m)   Wt 96 lb 6.4 oz (43.7 kg)   SpO2 98%   BMI 19.47 kg/m  Wt Readings from Last 3 Encounters:  02/12/23 96 lb 6.4 oz (43.7 kg)  12/03/22 96 lb (43.5 kg)  11/08/22 96 lb 3.2 oz (43.6 kg)    Physical Exam Vitals reviewed.  Constitutional:      General: She is not in acute distress.    Appearance: Normal appearance.  HENT:     Head: Normocephalic and atraumatic.     Right Ear: External ear normal.     Left Ear: External ear normal.  Eyes:     General: No scleral icterus.       Right eye: No discharge.        Left eye: No discharge.     Conjunctiva/sclera: Conjunctivae normal.  Neck:     Thyroid: No thyromegaly.  Cardiovascular:     Rate and Rhythm: Normal rate and regular rhythm.  Pulmonary:     Effort: No respiratory distress.     Breath sounds: Normal breath sounds. No wheezing.  Abdominal:     General: Bowel sounds are normal.     Palpations: Abdomen is soft.      Tenderness: There is no abdominal tenderness.  Musculoskeletal:        General: No swelling or tenderness.     Cervical back: Neck supple. No tenderness.  Lymphadenopathy:     Cervical: No cervical adenopathy.  Skin:    Findings: No erythema or rash.  Neurological:     Mental Status: She is alert.  Psychiatric:        Mood and Affect: Mood normal.        Behavior: Behavior normal.      Outpatient Encounter Medications as of 02/12/2023  Medication Sig   cyanocobalamin (VITAMIN B12) 1000 MCG/ML injection Inject 1 mL into the muscle once a week for 3 weeks and then once every 30 days.   NEEDLE, DISP, 25 G (B-D DISP NEEDLE 25GX1") 25G X 1" MISC Use as directed to administer b12 injections   SYRINGE-NEEDLE, DISP, 3 ML 25G X 1" 3 ML MISC Use as directed for b12 injections   abatacept (ORENCIA) 250 MG injection Once every month   amLODipine (NORVASC) 2.5 MG tablet TAKE 1 TABLET BY MOUTH EVERY DAY   aspirin 81 MG chewable tablet Chew 81 mg by mouth daily.   calcium gluconate 650 MG tablet Take 650 mg by mouth daily.   carbamide peroxide (DEBROX) 6.5 % OTIC solution 5 drops in right ear q day.  Massage for approximately 5 minutes.   cholecalciferol (VITAMIN D) 1000 UNITS tablet Take 1,000 Units by mouth daily.   ELDERBERRY PO Take by mouth daily.   folic acid (FOLVITE) 1 MG tablet Take 1 mg by mouth daily. (Except MTX days)   lovastatin (MEVACOR) 10 MG tablet TAKE 1 TABLET BY MOUTH EVERYDAY AT BEDTIME   magnesium oxide (MAG-OX) 400 MG tablet Take 400 mg by mouth daily.   methotrexate 50 MG/2ML injection INJECT 0.2MLS SUBCUTANEOUSLY WEEKLY DISPENSE 2 VIALS   Triamcinolone Acetonide (NASACORT AQ NA) Place into the nose as needed.   valACYclovir (VALTREX) 1000 MG tablet TAKE 1 TABLET BY MOUTH EVERY DAY   [DISCONTINUED] pantoprazole (PROTONIX) 40 MG tablet TAKE 1 TABLET BY MOUTH EVERY DAY   [DISCONTINUED] telmisartan (MICARDIS) 40 MG tablet TAKE 1 TABLET BY MOUTH EVERY DAY   [EXPIRED]  cyanocobalamin (VITAMIN B12) injection 1,000 mcg    No facility-administered encounter medications on file as of 02/12/2023.     Lab Results  Component Value Date   WBC 5.5 02/10/2023   HGB 13.3 02/10/2023   HCT 39.8 02/10/2023   PLT 200.0 02/10/2023   GLUCOSE 77 02/10/2023   CHOL 220 (H) 02/10/2023   TRIG 85.0 02/10/2023   HDL 58.40 02/10/2023   LDLDIRECT 167.8 12/20/2013   LDLCALC 145 (H) 02/10/2023   ALT 11 02/10/2023   AST 18 02/10/2023   NA 135 02/10/2023   K 4.2 02/10/2023   CL 100 02/10/2023   CREATININE 0.92 02/10/2023   BUN 21 02/10/2023   CO2 26 02/10/2023   TSH 2.79 10/03/2022   INR 1.1 (H) 12/06/2013   HGBA1C 5.8 02/10/2023    MM 3D SCREEN BREAST BILATERAL  Result Date: 09/04/2022 CLINICAL DATA:  Screening. EXAM: DIGITAL SCREENING BILATERAL MAMMOGRAM WITH TOMOSYNTHESIS AND CAD TECHNIQUE: Bilateral screening digital craniocaudal and mediolateral oblique mammograms were obtained. Bilateral screening digital breast tomosynthesis was performed. The images were evaluated with computer-aided detection. COMPARISON:  Previous exam(s). ACR Breast Density Category b: There are scattered areas of fibroglandular density. FINDINGS: There are no findings suspicious for malignancy. IMPRESSION: No mammographic evidence of malignancy. A result letter of this screening mammogram will be mailed directly to the patient. RECOMMENDATION: Screening mammogram in one year. (Code:SM-B-01Y) BI-RADS CATEGORY  1: Negative. Electronically Signed   By: Dorise Bullion III M.D.   On: 09/04/2022 16:56       Assessment & Plan:  Hypercholesteremia Assessment & Plan: Intolerant to praluent.  Feels better off.  Lovastatin 37m q day.  Tolerates.  Follow lipid panel and liver function tests.    Orders: -     Lipid panel; Future -     Hepatic function panel; Future  Hyperglycemia Assessment & Plan: Follow met b and a1c.   Orders: -     Hemoglobin A1c; Future  Primary hypertension Assessment &  Plan: Continue micardis and amlodipine. Continue to spot check pressures.  Follow metabolic panel.   Orders: -     Basic metabolic panel; Future  B12 deficiency -     Cyanocobalamin  Aortic atherosclerosis (HCC) Assessment & Plan: On qod lovastatin and tolerating.  Follow.    Anemia, unspecified type Assessment & Plan: Follow cbc.    Bilateral carotid artery disease, unspecified type [facility]) Assessment & Plan: Carotid ultrasound 10/2021- Dr Delana Meyer - recommended f/u carotid ultrasound in 2 years.    COPD with chronic bronchitis and emphysema ([place]) Assessment & Plan: Has seen Dr Patsey Berthold.  Breathing stable.  Follow.    History of cerebral aneurysm  Assessment & Plan: S/p stenting.  Being followed at [facility]. On aspirin.   Last evaluated 05/2022.  MRA  06/12/22 - ok.  Recommended continuing aspirin.  F/u in 3 years.    Hyponatremia Assessment & Plan: Has seen nephrology.  Discussed diet with her today. She is eating better.  Has decreased free fluid intake slightly.  Follow.    Lung nodules Assessment & Plan: Followed by pulmonary.  Had f/u CT scan 10/2020.   Stable.  Recommended f/u in 2 years.     Rheumatoid arthritis, involving unspecified site, unspecified whether rheumatoid factor present [facility]) Assessment & Plan: On MTX and orencia.  Stable.  Followed by rheumatology.    Stress Assessment & Plan: Discussed with her today. She feels she is doing well.  Follow.    Other orders -     Cyanocobalamin; Inject 1 mL into the muscle once a week for 3 weeks and then once every 30 days.  Dispense: 10 mL; Refill: 0 -     Syringe/Needle (Disp); Use as directed for b12 injections  Dispense: 50 each; Refill: 0 -     BD Disp Needle; Use as directed to administer b12 injections  Dispense: 50 each; Refill: 0     Einar Pheasant, MD

## 2023-02-14 ENCOUNTER — Other Ambulatory Visit: Payer: Self-pay | Admitting: Internal Medicine

## 2023-02-16 ENCOUNTER — Telehealth: Payer: Self-pay | Admitting: Internal Medicine

## 2023-02-16 ENCOUNTER — Encounter: Payer: Self-pay | Admitting: Internal Medicine

## 2023-02-16 NOTE — Assessment & Plan Note (Signed)
Discussed with her today. She feels she is doing well.  Follow.

## 2023-02-16 NOTE — Assessment & Plan Note (Signed)
Has seen Dr Patsey Berthold.  Breathing stable.  Follow.

## 2023-02-16 NOTE — Assessment & Plan Note (Signed)
Follow met b and a1c.  

## 2023-02-16 NOTE — Assessment & Plan Note (Addendum)
On MTX and orencia.  Stable.  Followed by rheumatology.

## 2023-02-16 NOTE — Assessment & Plan Note (Signed)
Continue micardis and amlodipine. Continue to spot check pressures.  Follow metabolic panel.

## 2023-02-16 NOTE — Telephone Encounter (Signed)
Per review of our records, it appears she is overdue colonoscopy.  Please confirm with pt.  If overdue, then would like to refer.  See if has a preference of which GI MD she prefers to see.

## 2023-02-16 NOTE — Assessment & Plan Note (Signed)
S/p stenting.  Being followed at [facility]. On aspirin.   Last evaluated 05/2022.  MRA  06/12/22 - ok.  Recommended continuing aspirin.  F/u in 3 years.

## 2023-02-16 NOTE — Assessment & Plan Note (Signed)
Has seen nephrology.  Discussed diet with her today. She is eating better.  Has decreased free fluid intake slightly.  Follow.

## 2023-02-16 NOTE — Assessment & Plan Note (Signed)
Carotid ultrasound 10/2021- Dr Delana Meyer - recommended f/u carotid ultrasound in 2 years.

## 2023-02-16 NOTE — Assessment & Plan Note (Signed)
Intolerant to praluent.  Feels better off.  Lovastatin 30m q day.  Tolerates.  Follow lipid panel and liver function tests.

## 2023-02-16 NOTE — Assessment & Plan Note (Signed)
Followed by pulmonary.  Had f/u CT scan 10/2020.   Stable.  Recommended f/u in 2 years.

## 2023-02-16 NOTE — Assessment & Plan Note (Signed)
Follow cbc.  

## 2023-02-16 NOTE — Assessment & Plan Note (Signed)
On qod lovastatin and tolerating.  Follow.

## 2023-02-17 NOTE — Telephone Encounter (Signed)
Patient aware that she is over due and declines referral. She says she will let us know if any acute issues then will consider referring back to GI but as of right now wants to hold. Also noted that she would like a copy of her bloodwork- printed and placed up front for pick up per pt request.

## 2023-02-25 ENCOUNTER — Telehealth: Payer: Self-pay | Admitting: Internal Medicine

## 2023-02-25 NOTE — Telephone Encounter (Signed)
Pt called stating she possibly has bronchitis and would like medication called in. Pt stated she did not want to come in the office

## 2023-02-25 NOTE — Telephone Encounter (Signed)
Pt sched w/ Dr Nicki Reaper tomorrow 930

## 2023-02-26 ENCOUNTER — Ambulatory Visit: Payer: Medicare HMO | Admitting: Internal Medicine

## 2023-02-26 ENCOUNTER — Encounter: Payer: Self-pay | Admitting: Internal Medicine

## 2023-02-26 VITALS — BP 126/70 | HR 79 | Temp 98.1°F | Resp 16 | Ht 59.0 in | Wt 95.2 lb

## 2023-02-26 DIAGNOSIS — I10 Essential (primary) hypertension: Secondary | ICD-10-CM | POA: Diagnosis not present

## 2023-02-26 DIAGNOSIS — E7800 Pure hypercholesterolemia, unspecified: Secondary | ICD-10-CM | POA: Diagnosis not present

## 2023-02-26 DIAGNOSIS — R059 Cough, unspecified: Secondary | ICD-10-CM

## 2023-02-26 MED ORDER — AZITHROMYCIN 250 MG PO TABS: ORAL_TABLET | ORAL | 0 refills | Status: AC

## 2023-02-26 NOTE — Patient Instructions (Signed)
Hold lovastatin while taking zpak

## 2023-02-26 NOTE — Progress Notes (Signed)
Subjective:    Patient ID: Erica Little, female    DOB: 24-Aug-1946, 77 y.o.   MRN: [id number]  Patient here for  Chief Complaint  Patient presents with   Nasal Congestion   Cough    HPI Work in appt.  Concerns regarding bronchitis.  Sees rheumatology for RA. On orencia and MTX.  Was here on 02/12/23 - nasal congestion.  Symptoms have worsened.  Was outside 5-6 days ago and started having increased cough.  Reports increased nasal congestion and head congestion.  Increased chest congestion and cough.  Cough productive of green mucus.  No chest pain or sob.  No nausea or vomiting.  No diarrhea.     Past Medical History:  Diagnosis Date   Family history of adverse reaction to anesthesia    Mother - PONV   Family history of brain aneurysm    GERD (gastroesophageal reflux disease)    Hyperlipidemia    Hypertension    MRSA (methicillin resistant Staphylococcus aureus)    skin infections   Rheumatoid arthritis(714.0)    transaminitis on MTX, remicade rxn, s/p sulfasalazine, orencia   Ulcer disease    Past Surgical History:  Procedure Laterality Date   CATARACT EXTRACTION W/PHACO Left 01/22/2021   Procedure: CATARACT EXTRACTION PHACO AND INTRAOCULAR LENS PLACEMENT ([place]) LEFT;  Surgeon: Eulogio Bear, MD;  Location: [place];  Service: Ophthalmology;  Laterality: Left;  5.90 0:37.7   CATARACT EXTRACTION W/PHACO Right 02/05/2021   Procedure: CATARACT EXTRACTION PHACO AND INTRAOCULAR LENS PLACEMENT ([facility]) RIGHT 4.33 00:34.6;  Surgeon: Eulogio Bear, MD;  Location: [place];  Service: Ophthalmology;  Laterality: Right;   CHOLECYSTECTOMY  2010   INTRACRANIAL ANEURYSM REPAIR  05/29/2016   MCP replacements  2001   left   TUBAL LIGATION     Family History  Problem Relation Age of Onset   Heart disease Mother    Diabetes Mother    Hypercholesterolemia Mother    Heart disease Father    Heart disease Brother        MI age 5   Hypercholesterolemia  Sister    Cancer Sister    Colon polyps Sister    Arthritis/Rheumatoid Paternal Aunt    Arthritis/Rheumatoid Paternal Uncle    Breast cancer Neg Hx    Social History   Socioeconomic History   Marital status: Widowed    Spouse name: Not on file   Number of children: 1   Years of education: Not on file   Highest education level: Not on file  Occupational History   Not on file  Tobacco Use   Smoking status: Former    Types: Cigarettes    Quit date: 12/30/1988    Years since quitting: 34.1   Smokeless tobacco: Never  Vaping Use   Vaping Use: Never used  Substance and Sexual Activity   Alcohol use: No    Alcohol/week: 0.0 standard drinks of alcohol   Drug use: No   Sexual activity: Yes  Other Topics Concern   Not on file  Social History Narrative   Enjoys dancing    Social Determinants of Health   Financial Resource Strain: Low Risk  (12/03/2022)   Overall Financial Resource Strain (CARDIA)    Difficulty of Paying Living Expenses: Not hard at all  Food Insecurity: No Food Insecurity (12/03/2022)   Hunger Vital Sign    Worried About Running Out of Food in the Last Year: Never true    Ran Out of Food in  the Last Year: Never true  Transportation Needs: No Transportation Needs (12/03/2022)   PRAPARE - Hydrologist (Medical): No    Lack of Transportation (Non-Medical): No  Physical Activity: Sufficiently Active (12/03/2022)   Exercise Vital Sign    Days of Exercise per Week: 5 days    Minutes of Exercise per Session: 30 min  Stress: No Stress Concern Present (12/03/2022)   [place]    Feeling of Stress : Not at all  Social Connections: Unknown (12/03/2022)   Social Connection and Isolation Panel [NHANES]    Frequency of Communication with Friends and Family: More than three times a week    Frequency of Social Gatherings with Friends and Family: More than three times a week     Attends Religious Services: Not on file    Active Member of Clubs or Organizations: Not on file    Attends Archivist Meetings: Not on file    Marital Status: Widowed     Review of Systems  Constitutional:  Negative for appetite change and fever.  HENT:  Positive for congestion and postnasal drip. Negative for sore throat.   Respiratory:  Positive for cough. Negative for chest tightness and shortness of breath.   Cardiovascular:  Negative for chest pain and palpitations.  Gastrointestinal:  Negative for abdominal pain, diarrhea, nausea and vomiting.  Genitourinary:  Negative for difficulty urinating and dysuria.  Musculoskeletal:  Negative for myalgias.       Joints stable.   Skin:  Negative for color change and rash.  Neurological:  Negative for dizziness and headaches.  Psychiatric/Behavioral:  Negative for agitation and dysphoric mood.        Objective:     BP 126/70   Pulse 79   Temp 98.1 F (36.7 C)   Resp 16   Ht '4\' 11"'$  (1.499 m)   Wt 95 lb 3.2 oz (43.2 kg)   SpO2 96%   BMI 19.23 kg/m  Wt Readings from Last 3 Encounters:  02/26/23 95 lb 3.2 oz (43.2 kg)  02/12/23 96 lb 6.4 oz (43.7 kg)  12/03/22 96 lb (43.5 kg)    Physical Exam Vitals reviewed.  Constitutional:      General: She is not in acute distress.    Appearance: Normal appearance.  HENT:     Head: Normocephalic and atraumatic.     Right Ear: External ear normal.     Left Ear: External ear normal.  Eyes:     General: No scleral icterus.       Right eye: No discharge.        Left eye: No discharge.     Conjunctiva/sclera: Conjunctivae normal.  Neck:     Thyroid: No thyromegaly.  Cardiovascular:     Rate and Rhythm: Normal rate and regular rhythm.  Pulmonary:     Effort: No respiratory distress.     Breath sounds: Normal breath sounds. No wheezing.  Abdominal:     General: Bowel sounds are normal.     Palpations: Abdomen is soft.     Tenderness: There is no abdominal tenderness.   Musculoskeletal:        General: No swelling or tenderness.     Cervical back: Neck supple. No tenderness.  Lymphadenopathy:     Cervical: No cervical adenopathy.  Skin:    Findings: No erythema or rash.  Neurological:     Mental Status: She is alert.  Psychiatric:  Mood and Affect: Mood normal.        Behavior: Behavior normal.      Outpatient Encounter Medications as of 02/26/2023  Medication Sig   azithromycin (ZITHROMAX) 250 MG tablet Take 2 tablets on day 1, then 1 tablet daily on days 2 through 5   abatacept (ORENCIA) 250 MG injection Once every month   amLODipine (NORVASC) 2.5 MG tablet TAKE 1 TABLET BY MOUTH EVERY DAY   aspirin 81 MG chewable tablet Chew 81 mg by mouth daily.   calcium gluconate 650 MG tablet Take 650 mg by mouth daily.   carbamide peroxide (DEBROX) 6.5 % OTIC solution 5 drops in right ear q day.  Massage for approximately 5 minutes.   cholecalciferol (VITAMIN D) 1000 UNITS tablet Take 1,000 Units by mouth daily.   cyanocobalamin (VITAMIN B12) 1000 MCG/ML injection Inject 1 mL into the muscle once a week for 3 weeks and then once every 30 days.   ELDERBERRY PO Take by mouth daily.   folic acid (FOLVITE) 1 MG tablet Take 1 mg by mouth daily. (Except MTX days)   lovastatin (MEVACOR) 10 MG tablet TAKE 1 TABLET BY MOUTH EVERYDAY AT BEDTIME   magnesium oxide (MAG-OX) 400 MG tablet Take 400 mg by mouth daily.   methotrexate 50 MG/2ML injection INJECT 0.2MLS SUBCUTANEOUSLY WEEKLY DISPENSE 2 VIALS   NEEDLE, DISP, 25 G (B-D DISP NEEDLE 25GX1") 25G X 1" MISC Use as directed to administer b12 injections   pantoprazole (PROTONIX) 40 MG tablet TAKE 1 TABLET BY MOUTH EVERY DAY   SYRINGE-NEEDLE, DISP, 3 ML 25G X 1" 3 ML MISC Use as directed for b12 injections   telmisartan (MICARDIS) 40 MG tablet TAKE 1 TABLET BY MOUTH EVERY DAY   Triamcinolone Acetonide (NASACORT AQ NA) Place into the nose as needed.   valACYclovir (VALTREX) 1000 MG tablet TAKE 1 TABLET BY MOUTH  EVERY DAY   No facility-administered encounter medications on file as of 02/26/2023.     Lab Results  Component Value Date   WBC 5.5 02/10/2023   HGB 13.3 02/10/2023   HCT 39.8 02/10/2023   PLT 200.0 02/10/2023   GLUCOSE 77 02/10/2023   CHOL 220 (H) 02/10/2023   TRIG 85.0 02/10/2023   HDL 58.40 02/10/2023   LDLDIRECT 167.8 12/20/2013   LDLCALC 145 (H) 02/10/2023   ALT 11 02/10/2023   AST 18 02/10/2023   NA 135 02/10/2023   K 4.2 02/10/2023   CL 100 02/10/2023   CREATININE 0.92 02/10/2023   BUN 21 02/10/2023   CO2 26 02/10/2023   TSH 2.79 10/03/2022   INR 1.1 (H) 12/06/2013   HGBA1C 5.8 02/10/2023    MM 3D SCREEN BREAST BILATERAL  Result Date: 09/04/2022 CLINICAL DATA:  Screening. EXAM: DIGITAL SCREENING BILATERAL MAMMOGRAM WITH TOMOSYNTHESIS AND CAD TECHNIQUE: Bilateral screening digital craniocaudal and mediolateral oblique mammograms were obtained. Bilateral screening digital breast tomosynthesis was performed. The images were evaluated with computer-aided detection. COMPARISON:  Previous exam(s). ACR Breast Density Category b: There are scattered areas of fibroglandular density. FINDINGS: There are no findings suspicious for malignancy. IMPRESSION: No mammographic evidence of malignancy. A result letter of this screening mammogram will be mailed directly to the patient. RECOMMENDATION: Screening mammogram in one year. (Code:SM-B-01Y) BI-RADS CATEGORY  1: Negative. Electronically Signed   By: Dorise Bullion III M.D.   On: 09/04/2022 16:56       Assessment & Plan:  Cough, unspecified type Assessment & Plan: Increased congestion and cough as outlined.  Discussed robitussin  or delsym. Prefers not to take. Concern given worsening of symptoms over the last several days and given on orencia, will treat for bacterial infection - URI.  Treat with zpak as directed.  Follow closely.  Call with update.  Hold on prednisone.     Primary hypertension Assessment & Plan: Continue  micardis and amlodipine. Continue to spot check pressures.  Follow metabolic panel.    Hypercholesteremia Assessment & Plan: Intolerant to praluent.  Feels better off.  Lovastatin '10mg'$  q day.  Tolerates.  Follow lipid panel and liver function tests.  Will hold lovastatin while on zpak.     Other orders -     Azithromycin; Take 2 tablets on day 1, then 1 tablet daily on days 2 through 5  Dispense: 6 tablet; Refill: 0     Einar Pheasant, MD

## 2023-03-02 ENCOUNTER — Encounter: Payer: Self-pay | Admitting: Internal Medicine

## 2023-03-02 NOTE — Assessment & Plan Note (Signed)
Increased congestion and cough as outlined.  Discussed robitussin or delsym. Prefers not to take. Concern given worsening of symptoms over the last several days and given on orencia, will treat for bacterial infection - URI.  Treat with zpak as directed.  Follow closely.  Call with update.  Hold on prednisone.

## 2023-03-02 NOTE — Assessment & Plan Note (Signed)
Continue micardis and amlodipine. Continue to spot check pressures.  Follow metabolic panel.

## 2023-03-02 NOTE — Assessment & Plan Note (Signed)
Intolerant to praluent.  Feels better off.  Lovastatin '10mg'$  q day.  Tolerates.  Follow lipid panel and liver function tests.  Will hold lovastatin while on zpak.

## 2023-03-07 NOTE — Telephone Encounter (Signed)
Pt called stating she is still not feeling better in her head she would like some antibiotics called in

## 2023-03-07 NOTE — Telephone Encounter (Signed)
Patient says head is still stopped up the Z-pack did not clear it up.NO fever chills her nose is just stopped up even after blowing nose is still stopped up. but her chest has cleared.Offered appointment she stated no PCP advised if still not clear PP would call something in. Advised PCP not in office patient ask just to let her know on Monday she was in no hurry.

## 2023-03-08 NOTE — Telephone Encounter (Signed)
Tried to call her over the weekend.  Tried home phone and cell phone.  Unable to leave message and unable to reach.  Just wanted to clarify what she is taking/using now.  Nasal spray, robitussin, etc?  Per note, cough has cleared?  Sinus pressure?  Colored mucus?  Can adjust medication - just want to confirm a few things.

## 2023-03-10 MED ORDER — CEFDINIR 300 MG PO CAPS: 300.0000 mg | ORAL_CAPSULE | Freq: Two times a day (BID) | ORAL | 0 refills | Status: DC

## 2023-03-10 NOTE — Telephone Encounter (Signed)
Spoke to pt. Feeling better after zpak, but not completely over.  Will continue her nasal regimen.  Omnicef as directed.  Confirmed only abx allergy is sulfa.  Rx sent in to [facility].

## 2023-03-10 NOTE — Addendum Note (Signed)
Addended by: Alisa Graff on: 03/10/2023 05:28 PM   Modules accepted: Orders

## 2023-03-10 NOTE — Telephone Encounter (Signed)
Patient confirmed no cough, no colored mucus, no sinus pressure. She is using saline nasal spray and a [facility] brand nasal spray called "X-lear congestion relief" 2-3 times per day. Not feeling bad just lingering nasal congestion after finishing her abx.

## 2023-06-02 ENCOUNTER — Other Ambulatory Visit: Payer: Self-pay | Admitting: Internal Medicine

## 2023-06-02 DIAGNOSIS — T466X5A Adverse effect of antihyperlipidemic and antiarteriosclerotic drugs, initial encounter: Secondary | ICD-10-CM

## 2023-06-02 DIAGNOSIS — E7800 Pure hypercholesterolemia, unspecified: Secondary | ICD-10-CM

## 2023-06-10 ENCOUNTER — Other Ambulatory Visit (INDEPENDENT_AMBULATORY_CARE_PROVIDER_SITE_OTHER): Payer: Medicare HMO

## 2023-06-10 DIAGNOSIS — I10 Essential (primary) hypertension: Secondary | ICD-10-CM | POA: Diagnosis not present

## 2023-06-10 DIAGNOSIS — E7800 Pure hypercholesterolemia, unspecified: Secondary | ICD-10-CM | POA: Diagnosis not present

## 2023-06-10 DIAGNOSIS — R739 Hyperglycemia, unspecified: Secondary | ICD-10-CM | POA: Diagnosis not present

## 2023-06-10 LAB — LIPID PANEL
Cholesterol: 199 mg/dL (ref 0–200)
HDL: 56.3 mg/dL (ref >39.00)
LDL Cholesterol: 120 mg/dL — ABNORMAL HIGH (ref 0–99)
NonHDL: 143.01
Total CHOL/HDL Ratio: 4
Triglycerides: 113 mg/dL (ref 0.0–149.0)
VLDL: 22.6 mg/dL (ref 0.0–40.0)

## 2023-06-10 LAB — BASIC METABOLIC PANEL
BUN: 12 mg/dL (ref 6–23)
CO2: 25 mEq/L (ref 19–32)
Calcium: 9.2 mg/dL (ref 8.4–10.5)
Chloride: 98 mEq/L (ref 96–112)
Creatinine, Ser: 0.87 mg/dL (ref 0.40–1.20)
GFR: 63.79 mL/min (ref >60.00)
Glucose, Bld: 83 mg/dL (ref 70–99)
Potassium: 4.3 mEq/L (ref 3.5–5.1)
Sodium: 131 mEq/L — ABNORMAL LOW (ref 135–145)

## 2023-06-10 LAB — HEPATIC FUNCTION PANEL
ALT: 11 U/L (ref 0–35)
AST: 22 U/L (ref 0–37)
Albumin: 4.3 g/dL (ref 3.5–5.2)
Alkaline Phosphatase: 82 U/L (ref 39–117)
Bilirubin, Direct: 0.1 mg/dL (ref 0.0–0.3)
Total Bilirubin: 0.9 mg/dL (ref 0.2–1.2)
Total Protein: 7 g/dL (ref 6.0–8.3)

## 2023-06-10 LAB — HEMOGLOBIN A1C: Hgb A1c MFr Bld: 5.9 % (ref 4.6–6.5)

## 2023-06-11 ENCOUNTER — Telehealth: Payer: Self-pay

## 2023-06-11 NOTE — Telephone Encounter (Signed)
Noted  

## 2023-06-11 NOTE — Telephone Encounter (Signed)
Patient called and note was read. Patient stated she will see office on Friday.

## 2023-06-11 NOTE — Telephone Encounter (Signed)
-----   Message from Dale McLain, MD sent at 06/11/2023  4:29 AM EDT ----- She is coming in this week for appt, but notify her that her sodium has decreased some.  Make sure eating regular meals and not drinking an excessive amount of free water.  We will follow.  Cholesterol improved. Kidney function improved from last check. Overall sugar is stable and liver function tests are wnl.

## 2023-06-13 ENCOUNTER — Encounter: Payer: Self-pay | Admitting: Internal Medicine

## 2023-06-13 ENCOUNTER — Ambulatory Visit: Payer: Medicare HMO | Admitting: Internal Medicine

## 2023-06-13 VITALS — BP 128/70 | HR 72 | Temp 98.0°F | Resp 16 | Ht 59.0 in | Wt 95.2 lb

## 2023-06-13 DIAGNOSIS — I779 Disorder of arteries and arterioles, unspecified: Secondary | ICD-10-CM | POA: Diagnosis not present

## 2023-06-13 DIAGNOSIS — I700 Atherosclerosis of aorta: Secondary | ICD-10-CM

## 2023-06-13 DIAGNOSIS — Z8679 Personal history of other diseases of the circulatory system: Secondary | ICD-10-CM

## 2023-06-13 DIAGNOSIS — M069 Rheumatoid arthritis, unspecified: Secondary | ICD-10-CM

## 2023-06-13 DIAGNOSIS — J439 Emphysema, unspecified: Secondary | ICD-10-CM

## 2023-06-13 DIAGNOSIS — R918 Other nonspecific abnormal finding of lung field: Secondary | ICD-10-CM

## 2023-06-13 DIAGNOSIS — R634 Abnormal weight loss: Secondary | ICD-10-CM

## 2023-06-13 DIAGNOSIS — I10 Essential (primary) hypertension: Secondary | ICD-10-CM

## 2023-06-13 DIAGNOSIS — E7800 Pure hypercholesterolemia, unspecified: Secondary | ICD-10-CM

## 2023-06-13 DIAGNOSIS — Z83719 Family history of colon polyps, unspecified: Secondary | ICD-10-CM

## 2023-06-13 DIAGNOSIS — J4489 Other specified chronic obstructive pulmonary disease: Secondary | ICD-10-CM

## 2023-06-13 DIAGNOSIS — E871 Hypo-osmolality and hyponatremia: Secondary | ICD-10-CM | POA: Diagnosis not present

## 2023-06-13 DIAGNOSIS — F439 Reaction to severe stress, unspecified: Secondary | ICD-10-CM

## 2023-06-13 DIAGNOSIS — R739 Hyperglycemia, unspecified: Secondary | ICD-10-CM

## 2023-06-13 DIAGNOSIS — Z9889 Other specified postprocedural states: Secondary | ICD-10-CM

## 2023-06-13 NOTE — Progress Notes (Unsigned)
Subjective:    Patient ID: Erica Little, female    DOB: Jan 29, 1946, 77 y.o.   MRN: [id number]  Patient here for  Chief Complaint  Patient presents with   Medical Management of Chronic Issues    HPI Here to follow up regarding her cholesterol, hyponatremia, hypertension and RA.  Sees rheumatology.  On orencia and MTX. On pravastatin.  Intolerant to praluent.  Unable to increased her current cholesterol medication.  Tolerates three times per week.  Did not tolerate 4x/week.  No chest pain or sob reported.  No cough or congestion.  No abdominal pain reported.  Bowels stable with stool softener Chest CT - follow up appt - due.  States due follow up with Dr Jayme Cloud.  Discussed diet.  Drinking ensure.   Past Medical History:  Diagnosis Date   Family history of adverse reaction to anesthesia    Mother - PONV   Family history of brain aneurysm    GERD (gastroesophageal reflux disease)    Hyperlipidemia    Hypertension    MRSA (methicillin resistant Staphylococcus aureus)    skin infections   Rheumatoid arthritis(714.0)    transaminitis on MTX, remicade rxn, s/p sulfasalazine, orencia   Ulcer disease    Past Surgical History:  Procedure Laterality Date   CATARACT EXTRACTION W/PHACO Left 01/22/2021   Procedure: CATARACT EXTRACTION PHACO AND INTRAOCULAR LENS PLACEMENT (IOC) LEFT;  Surgeon: Nevada Crane, MD;  Location: [facility];  Service: Ophthalmology;  Laterality: Left;  5.90 0:37.7   CATARACT EXTRACTION W/PHACO Right 02/05/2021   Procedure: CATARACT EXTRACTION PHACO AND INTRAOCULAR LENS PLACEMENT (IOC) RIGHT 4.33 00:34.6;  Surgeon: Nevada Crane, MD;  Location: [facility];  Service: Ophthalmology;  Laterality: Right;   CHOLECYSTECTOMY  2010   INTRACRANIAL ANEURYSM REPAIR  05/29/2016   MCP replacements  2001   left   TUBAL LIGATION     Family History  Problem Relation Age of Onset   Heart disease Mother    Diabetes Mother    Hypercholesterolemia  Mother    Heart disease Father    Heart disease Brother        MI age 40   Hypercholesterolemia Sister    Cancer Sister    Colon polyps Sister    Arthritis/Rheumatoid Paternal Aunt    Arthritis/Rheumatoid Paternal Uncle    Breast cancer Neg Hx    Social History   Socioeconomic History   Marital status: Widowed    Spouse name: Not on file   Number of children: 1   Years of education: Not on file   Highest education level: Not on file  Occupational History   Not on file  Tobacco Use   Smoking status: Former    Types: Cigarettes    Quit date: 12/30/1988    Years since quitting: 34.4   Smokeless tobacco: Never  Vaping Use   Vaping Use: Never used  Substance and Sexual Activity   Alcohol use: No    Alcohol/week: 0.0 standard drinks of alcohol   Drug use: No   Sexual activity: Yes  Other Topics Concern   Not on file  Social History Narrative   Enjoys dancing    Social Determinants of Health   Financial Resource Strain: Low Risk  (12/03/2022)   Overall Financial Resource Strain (CARDIA)    Difficulty of Paying Living Expenses: Not hard at all  Food Insecurity: No Food Insecurity (12/03/2022)   Hunger Vital Sign    Worried About Running Out of Food  in the Last Year: Never true    Ran Out of Food in the Last Year: Never true  Transportation Needs: No Transportation Needs (12/03/2022)   PRAPARE - Administrator, Civil Service (Medical): No    Lack of Transportation (Non-Medical): No  Physical Activity: Sufficiently Active (12/03/2022)   Exercise Vital Sign    Days of Exercise per Week: 5 days    Minutes of Exercise per Session: 30 min  Stress: No Stress Concern Present (12/03/2022)   [employer] of Occupational Health - Occupational Stress Questionnaire    Feeling of Stress : Not at all  Social Connections: Unknown (12/03/2022)   Social Connection and Isolation Panel [NHANES]    Frequency of Communication with Friends and Family: More than three times a  week    Frequency of Social Gatherings with Friends and Family: More than three times a week    Attends Religious Services: Not on file    Active Member of Clubs or Organizations: Not on file    Attends Banker Meetings: Not on file    Marital Status: Widowed     Review of Systems     Objective:     BP 128/70   Pulse 72   Temp 98 F (36.7 C)   Resp 16   Ht 4\' 11"  (1.499 m)   Wt 95 lb 3.2 oz (43.2 kg)   SpO2 97%   BMI 19.23 kg/m  Wt Readings from Last 3 Encounters:  06/13/23 95 lb 3.2 oz (43.2 kg)  02/26/23 95 lb 3.2 oz (43.2 kg)  02/12/23 96 lb 6.4 oz (43.7 kg)    Physical Exam   Outpatient Encounter Medications as of 06/13/2023  Medication Sig   abatacept (ORENCIA) 250 MG injection Once every month   amLODipine (NORVASC) 2.5 MG tablet TAKE 1 TABLET BY MOUTH EVERY DAY   aspirin 81 MG chewable tablet Chew 81 mg by mouth daily.   calcium gluconate 650 MG tablet Take 650 mg by mouth daily.   carbamide peroxide (DEBROX) 6.5 % OTIC solution 5 drops in right ear q day.  Massage for approximately 5 minutes.   cholecalciferol (VITAMIN D) 1000 UNITS tablet Take 1,000 Units by mouth daily.   cyanocobalamin (VITAMIN B12) 1000 MCG/ML injection Inject 1 mL into the muscle once a week for 3 weeks and then once every 30 days.   ELDERBERRY PO Take by mouth daily.   folic acid (FOLVITE) 1 MG tablet Take 1 mg by mouth daily. (Except MTX days)   lovastatin (MEVACOR) 10 MG tablet TAKE 1 TABLET BY MOUTH EVERYDAY AT BEDTIME   magnesium oxide (MAG-OX) 400 MG tablet Take 400 mg by mouth daily.   methotrexate 50 MG/2ML injection INJECT 0.2MLS SUBCUTANEOUSLY WEEKLY DISPENSE 2 VIALS   NEEDLE, DISP, 25 G (B-D DISP NEEDLE 25GX1") 25G X 1" MISC Use as directed to administer b12 injections   pantoprazole (PROTONIX) 40 MG tablet TAKE 1 TABLET BY MOUTH EVERY DAY   SYRINGE-NEEDLE, DISP, 3 ML 25G X 1" 3 ML MISC Use as directed for b12 injections   telmisartan (MICARDIS) 40 MG tablet TAKE  1 TABLET BY MOUTH EVERY DAY   Triamcinolone Acetonide (NASACORT AQ NA) Place into the nose as needed.   valACYclovir (VALTREX) 1000 MG tablet TAKE 1 TABLET BY MOUTH EVERY DAY   [DISCONTINUED] cefdinir (OMNICEF) 300 MG capsule Take 1 capsule (300 mg total) by mouth 2 (two) times daily.   No facility-administered encounter medications on file as of  06/13/2023.     Lab Results  Component Value Date   WBC 5.5 02/10/2023   HGB 13.3 02/10/2023   HCT 39.8 02/10/2023   PLT 200.0 02/10/2023   GLUCOSE 83 06/10/2023   CHOL 199 06/10/2023   TRIG 113.0 06/10/2023   HDL 56.30 06/10/2023   LDLDIRECT 167.8 12/20/2013   LDLCALC 120 (H) 06/10/2023   ALT 11 06/10/2023   AST 22 06/10/2023   NA 131 (L) 06/10/2023   K 4.3 06/10/2023   CL 98 06/10/2023   CREATININE 0.87 06/10/2023   BUN 12 06/10/2023   CO2 25 06/10/2023   TSH 2.79 10/03/2022   INR 1.1 (H) 12/06/2013   HGBA1C 5.9 06/10/2023    MM 3D SCREEN BREAST BILATERAL  Result Date: 09/04/2022 CLINICAL DATA:  Screening. EXAM: DIGITAL SCREENING BILATERAL MAMMOGRAM WITH TOMOSYNTHESIS AND CAD TECHNIQUE: Bilateral screening digital craniocaudal and mediolateral oblique mammograms were obtained. Bilateral screening digital breast tomosynthesis was performed. The images were evaluated with computer-aided detection. COMPARISON:  Previous exam(s). ACR Breast Density Category b: There are scattered areas of fibroglandular density. FINDINGS: There are no findings suspicious for malignancy. IMPRESSION: No mammographic evidence of malignancy. A result letter of this screening mammogram will be mailed directly to the patient. RECOMMENDATION: Screening mammogram in one year. (Code:SM-B-01Y) BI-RADS CATEGORY  1: Negative. Electronically Signed   By: Gerome Sam III M.D.   On: 09/04/2022 16:56       Assessment & Plan:  There are no diagnoses linked to this encounter.   D[place], MD

## 2023-06-15 ENCOUNTER — Encounter: Payer: Self-pay | Admitting: Internal Medicine

## 2023-06-15 NOTE — Assessment & Plan Note (Signed)
Intolerant to praluent. Lovastatin three days per week.  Tolerating.  Did not tolerate 4 days per week. Continue current dose.  Follow lipid panel and liver function tests.

## 2023-06-15 NOTE — Assessment & Plan Note (Signed)
Taking statin three days per week. Did not tolerate 4 days per week.  Reviewed labs.  Did not tolerate praluent.  Continue current medication regimen.  Follow lipid panel and liver function tests.   Lab Results  Component Value Date   CHOL 199 06/10/2023   HDL 56.30 06/10/2023   LDLCALC 120 (H) 06/10/2023   LDLDIRECT 167.8 12/20/2013   TRIG 113.0 06/10/2023   CHOLHDL 4 06/10/2023

## 2023-06-15 NOTE — Assessment & Plan Note (Addendum)
Carotid ultrasound 10/2021- Dr Gilda Crease - recommended f/u carotid ultrasound in 2 years. Continue statin therapy.

## 2023-06-15 NOTE — Assessment & Plan Note (Signed)
Has seen nephrology.  Discussed diet with her today. She is eating better.  Has decreased free fluid intake slightly.  Recheck sodium next week.

## 2023-06-15 NOTE — Assessment & Plan Note (Signed)
Weight stable from last check.  Follow. Reports she is eating.

## 2023-06-15 NOTE — Assessment & Plan Note (Signed)
Continue micardis and amlodipine. Continue to spot check pressures.  Follow metabolic panel.  

## 2023-06-15 NOTE — Assessment & Plan Note (Signed)
S/p stenting.  Being followed at [facility]. On aspirin.   Last evaluated 05/2022.  MRA  06/12/22 - ok.  Recommended continuing aspirin.  F/u in 3 years.  

## 2023-06-15 NOTE — Assessment & Plan Note (Signed)
Colonoscopy 08/2014.  Recommended f/u 08/2019.  Overdue.  Notify - when agreeable for referral.

## 2023-06-15 NOTE — Assessment & Plan Note (Signed)
Has seen Dr Jayme Cloud.  Breathing stable.  Reports due follow up.  Arrange.

## 2023-06-15 NOTE — Assessment & Plan Note (Signed)
Followed by pulmonary.  Had f/u CT scan 10/2020.   Stable.  Recommended f/u in 2 years.  Discussed CT today.  States she is due follow up with pulmonary. Plans to discuss with them.

## 2023-06-15 NOTE — Assessment & Plan Note (Signed)
On MTX and orencia.  Stable.  Followed by rheumatology.  

## 2023-06-15 NOTE — Assessment & Plan Note (Signed)
Follow met b and a1c.  

## 2023-06-15 NOTE — Assessment & Plan Note (Signed)
Overall appears to be handling things well.  Follow.  ?

## 2023-06-16 ENCOUNTER — Other Ambulatory Visit: Payer: Medicare HMO

## 2023-06-16 ENCOUNTER — Other Ambulatory Visit (INDEPENDENT_AMBULATORY_CARE_PROVIDER_SITE_OTHER): Payer: Medicare HMO

## 2023-06-16 DIAGNOSIS — E871 Hypo-osmolality and hyponatremia: Secondary | ICD-10-CM

## 2023-06-17 LAB — SODIUM: Sodium: 134 mEq/L — ABNORMAL LOW (ref 135–145)

## 2023-06-30 ENCOUNTER — Encounter: Payer: Self-pay | Admitting: Internal Medicine

## 2023-06-30 DIAGNOSIS — Z0000 Encounter for general adult medical examination without abnormal findings: Secondary | ICD-10-CM

## 2023-07-01 ENCOUNTER — Other Ambulatory Visit: Payer: Self-pay | Admitting: Internal Medicine

## 2023-07-01 DIAGNOSIS — Z0000 Encounter for general adult medical examination without abnormal findings: Secondary | ICD-10-CM

## 2023-07-21 ENCOUNTER — Ambulatory Visit: Payer: Medicare HMO | Admitting: Student in an Organized Health Care Education/Training Program

## 2023-07-21 ENCOUNTER — Encounter: Payer: Self-pay | Admitting: Student in an Organized Health Care Education/Training Program

## 2023-07-21 VITALS — BP 118/62 | HR 60 | Temp 97.9°F | Ht 59.0 in | Wt 95.8 lb

## 2023-07-21 DIAGNOSIS — R911 Solitary pulmonary nodule: Secondary | ICD-10-CM | POA: Diagnosis not present

## 2023-07-21 NOTE — Progress Notes (Signed)
Synopsis: Referred in for pulmonary nodule by Dale [place], MD  Assessment & Plan:   1. Lung nodule  Presenting for follow up of a LLL ground glass nodule for which she has been undergoing radiographic surveillance. Patient with a history of pulmonary nodules that are secondary to RA, but the GGO certainly carries the risk of underlying low grade malignancy. Will proceed with repeat imaging to monitor the ground glass nodule and should it show growth, would consider biopsy vs. Resection.  - CT CHEST WO CONTRAST; Future   Return in about 2 months (around 09/21/2023).  I spent 45 minutes caring for this patient today, including preparing to see the patient, obtaining a medical history , reviewing a separately obtained history, performing a medically appropriate examination and/or evaluation, counseling and educating the patient/family/caregiver, ordering medications, tests, or procedures, documenting clinical information in the electronic health record, and independently interpreting results (not separately reported/billed) and communicating results to the patient/family/caregiver  Raechel Chute, MD [facility] 07/21/2023 2:05 PM    End of visit medications:  No orders of the defined types were placed in this encounter.    Current Outpatient Medications:    abatacept (ORENCIA) 250 MG injection, Once every month, Disp: , Rfl:    amLODipine (NORVASC) 2.5 MG tablet, TAKE 1 TABLET BY MOUTH EVERY DAY, Disp: 90 tablet, Rfl: 1   aspirin 81 MG chewable tablet, Chew 81 mg by mouth daily., Disp: , Rfl:    calcium gluconate 650 MG tablet, Take 650 mg by mouth daily., Disp: , Rfl:    carbamide peroxide (DEBROX) 6.5 % OTIC solution, 5 drops in right ear q day.  Massage for approximately 5 minutes., Disp: 15 mL, Rfl: 0   cholecalciferol (VITAMIN D) 1000 UNITS tablet, Take 1,000 Units by mouth daily., Disp: , Rfl:    cyanocobalamin (VITAMIN B12) 1000 MCG/ML injection, Inject  1 mL into the muscle once a week for 3 weeks and then once every 30 days., Disp: 10 mL, Rfl: 0   ELDERBERRY PO, Take by mouth daily., Disp: , Rfl:    folic acid (FOLVITE) 1 MG tablet, Take 1 mg by mouth daily. (Except MTX days), Disp: , Rfl:    lovastatin (MEVACOR) 10 MG tablet, TAKE 1 TABLET BY MOUTH EVERYDAY AT BEDTIME, Disp: 90 tablet, Rfl: 1   magnesium oxide (MAG-OX) 400 MG tablet, Take 400 mg by mouth daily., Disp: , Rfl:    methotrexate 50 MG/2ML injection, INJECT 0.2MLS SUBCUTANEOUSLY WEEKLY DISPENSE 2 VIALS, Disp: , Rfl: 1   NEEDLE, DISP, 25 G (B-D DISP NEEDLE 25GX1") 25G X 1" MISC, Use as directed to administer b12 injections, Disp: 50 each, Rfl: 0   pantoprazole (PROTONIX) 40 MG tablet, TAKE 1 TABLET BY MOUTH EVERY DAY, Disp: 90 tablet, Rfl: 3   SYRINGE-NEEDLE, DISP, 3 ML 25G X 1" 3 ML MISC, Use as directed for b12 injections, Disp: 50 each, Rfl: 0   telmisartan (MICARDIS) 40 MG tablet, TAKE 1 TABLET BY MOUTH EVERY DAY, Disp: 90 tablet, Rfl: 1   Triamcinolone Acetonide (NASACORT AQ NA), Place into the nose as needed., Disp: , Rfl:    valACYclovir (VALTREX) 1000 MG tablet, TAKE 1 TABLET BY MOUTH EVERY DAY, Disp: 90 tablet, Rfl: 1   Subjective:   PATIENT ID: Erica Little GENDER: female DOB: [birth year], MRN: [id number]  Chief Complaint  Patient presents with   pulmonary consult    CT 2021- prod cough with clear sputum.    HPI  Patient is  a pleasant 77 year old female presenting to clinic for the evaluation of pulmonary nodules.  Patient was previously established in our office and was followed by Dr. Nicholos Johns then by Dr. Jayme Cloud. She was noted to have multiple pulmonary nodules for which she was undergoing surveillance imaging. Patient's most recent chest CT was from 2021 where a ground glass nodule was noted to be stable and recommendation was made for 2 year interval follow up. She was lost to follow up and presents back today to re-establish care.  She reports no  pulmonary symptoms, and overall feels well. She has a chronic cough for many years but this is unchanged. Otherwise she denies any sputum production, hemoptysis, fever, chills, night sweats, or weight changes. She's had no chest pain or chest tightness. Her RA is under control and she follows closely with rheumatology.  Patient was a former smoker, having quit in 1990. She has   CT chest high-resolution 11/18/17: Compared to noncontrasted CT chest 05/19/2018; no evidence of interstitial lung disease, there are several tiny lung nodules, the largest of which is an 8 mm groundglass nodule, there are solid nodules which are 4 mm or less.  Review of the most recent scans shows no significant change from previous, largest nodule in the right middle lobe is 4 mm, largest groundglass nodule in the left lower lobe is 8 mm. PFT 11/18/17: Spirometry FVC 126% of predicted, FEV1 equals 101%, ratio was 65%, there was no bronchodilator administered. Lung volumes show TLC of 135%, vital capacity 126%.  RV to TLC ratio is normal. DLCO is normal at 95%.  Flow volume loop suggests obstruction.  Overall this test is suggestive of mild COPD CT chest 11/18/2018>> Largest nodule is 7 mm appears to be a groundglass nodule. CT chest 11/27/2020 1. Stable tiny bilateral pulmonary nodules. No new or progressive findings. 2. 7 mm ground-glass opacity in the left lower lobe is unchanged. This is stable comparing back to 11/18/2017 giving 3 years of stable imaging follow-up. Repeat CT is recommended every 2 years until 5 years of stability has been established.  Ancillary information including prior medications, full medical/surgical/family/social histories, and PFTs (when available) are listed below and have been reviewed.   Review of Systems  Constitutional:  Negative for chills, fever, malaise/fatigue and weight loss.  Respiratory:  Positive for cough. Negative for hemoptysis, sputum production, shortness of breath and  wheezing.   Cardiovascular:  Negative for chest pain.  Skin: Negative.   Psychiatric/Behavioral:  Negative for depression.      Objective:   Vitals:   07/21/23 1350  BP: 118/62  Pulse: 60  Temp: 97.9 F (36.6 C)  TempSrc: Temporal  SpO2: 100%  Weight: 95 lb 12.8 oz (43.5 kg)  Height: 4\' 11"  (1.499 m)   100% on RA BMI Readings from Last 3 Encounters:  07/21/23 19.35 kg/m  06/13/23 19.23 kg/m  02/26/23 19.23 kg/m   Wt Readings from Last 3 Encounters:  07/21/23 95 lb 12.8 oz (43.5 kg)  06/13/23 95 lb 3.2 oz (43.2 kg)  02/26/23 95 lb 3.2 oz (43.2 kg)    Physical Exam Constitutional:      General: She is not in acute distress.    Appearance: Normal appearance. She is not ill-appearing.  HENT:     Head: Normocephalic.     Mouth/Throat:     Mouth: Mucous membranes are moist.  Cardiovascular:     Rate and Rhythm: Normal rate and regular rhythm.     Pulses: Normal pulses.  Heart sounds: Normal heart sounds.  Pulmonary:     Effort: Pulmonary effort is normal. No respiratory distress.     Breath sounds: Normal breath sounds. No wheezing, rhonchi or rales.  Abdominal:     General: Abdomen is flat.     Palpations: Abdomen is soft.  Neurological:     General: No focal deficit present.     Mental Status: She is alert and oriented to person, place, and time.       Ancillary Information    Past Medical History:  Diagnosis Date   Family history of adverse reaction to anesthesia    Mother - PONV   Family history of brain aneurysm    GERD (gastroesophageal reflux disease)    Hyperlipidemia    Hypertension    MRSA (methicillin resistant Staphylococcus aureus)    skin infections   Rheumatoid arthritis(714.0)    transaminitis on MTX, remicade rxn, s/p sulfasalazine, orencia   Ulcer disease      Family History  Problem Relation Age of Onset   Heart disease Mother    Diabetes Mother    Hypercholesterolemia Mother    Heart disease Father    Heart disease  Brother        MI age 85   Hypercholesterolemia Sister    Cancer Sister    Colon polyps Sister    Arthritis/Rheumatoid Paternal Aunt    Arthritis/Rheumatoid Paternal Uncle    Breast cancer Neg Hx      Past Surgical History:  Procedure Laterality Date   CATARACT EXTRACTION W/PHACO Left 01/22/2021   Procedure: CATARACT EXTRACTION PHACO AND INTRAOCULAR LENS PLACEMENT (IOC) LEFT;  Surgeon: Nevada Crane, MD;  Location: [facility];  Service: Ophthalmology;  Laterality: Left;  5.90 0:37.7   CATARACT EXTRACTION W/PHACO Right 02/05/2021   Procedure: CATARACT EXTRACTION PHACO AND INTRAOCULAR LENS PLACEMENT (IOC) RIGHT 4.33 00:34.6;  Surgeon: Nevada Crane, MD;  Location: [facility];  Service: Ophthalmology;  Laterality: Right;   CHOLECYSTECTOMY  2010   INTRACRANIAL ANEURYSM REPAIR  05/29/2016   MCP replacements  2001   left   TUBAL LIGATION      Social History   Socioeconomic History   Marital status: Widowed    Spouse name: Not on file   Number of children: 1   Years of education: Not on file   Highest education level: Not on file  Occupational History   Not on file  Tobacco Use   Smoking status: Former    Current packs/day: 0.00    Types: Cigarettes    Quit date: 12/30/1988    Years since quitting: 34.5   Smokeless tobacco: Never  Vaping Use   Vaping status: Never Used  Substance and Sexual Activity   Alcohol use: No    Alcohol/week: 0.0 standard drinks of alcohol   Drug use: No   Sexual activity: Yes  Other Topics Concern   Not on file  Social History Narrative   Enjoys dancing    Social Determinants of Health   Financial Resource Strain: Low Risk  (12/03/2022)   Overall Financial Resource Strain (CARDIA)    Difficulty of Paying Living Expenses: Not hard at all  Food Insecurity: No Food Insecurity (12/03/2022)   Hunger Vital Sign    Worried About Running Out of Food in the Last Year: Never true    Ran Out of Food in the Last Year: Never true   Transportation Needs: No Transportation Needs (12/03/2022)   PRAPARE - Transportation  Lack of Transportation (Medical): No    Lack of Transportation (Non-Medical): No  Physical Activity: Sufficiently Active (12/03/2022)   Exercise Vital Sign    Days of Exercise per Week: 5 days    Minutes of Exercise per Session: 30 min  Stress: No Stress Concern Present (12/03/2022)   [employer] of Occupational Health - Occupational Stress Questionnaire    Feeling of Stress : Not at all  Social Connections: Unknown (12/03/2022)   Social Connection and Isolation Panel [NHANES]    Frequency of Communication with Friends and Family: More than three times a week    Frequency of Social Gatherings with Friends and Family: More than three times a week    Attends Religious Services: Not on file    Active Member of Clubs or Organizations: Not on file    Attends Banker Meetings: Not on file    Marital Status: Widowed  Intimate Partner Violence: Not At Risk (12/03/2022)   Humiliation, Afraid, Rape, and Kick questionnaire    Fear of Current or Ex-Partner: No    Emotionally Abused: No    Physically Abused: No    Sexually Abused: No     Allergies  Allergen Reactions   Oxycodone Nausea Only   Remicade [Infliximab] Other (See Comments)    Lightheadedness   Simvastatin     Joint pain   Sulfa Antibiotics     Stomach ulcers   Zetia [Ezetimibe] Other (See Comments)    Muscle aches    Latex Rash    "Tape tears skin"   Tape Rash    "tape tears skin"      CBC    Component Value Date/Time   WBC 5.5 02/10/2023 0816   RBC 4.42 02/10/2023 0816   HGB 13.3 02/10/2023 0816   HCT 39.8 02/10/2023 0816   PLT 200.0 02/10/2023 0816   MCV 90.1 02/10/2023 0816   MCH 27.2 11/18/2015 2250   MCHC 33.3 02/10/2023 0816   RDW 14.9 02/10/2023 0816   LYMPHSABS 1.4 02/10/2023 0816   MONOABS 0.5 02/10/2023 0816   EOSABS 0.4 02/10/2023 0816   BASOSABS 0.1 02/10/2023 0816    Pulmonary Functions  Testing Results:     No data to display          Outpatient Medications Prior to Visit  Medication Sig Dispense Refill   abatacept (ORENCIA) 250 MG injection Once every month     amLODipine (NORVASC) 2.5 MG tablet TAKE 1 TABLET BY MOUTH EVERY DAY 90 tablet 1   aspirin 81 MG chewable tablet Chew 81 mg by mouth daily.     calcium gluconate 650 MG tablet Take 650 mg by mouth daily.     carbamide peroxide (DEBROX) 6.5 % OTIC solution 5 drops in right ear q day.  Massage for approximately 5 minutes. 15 mL 0   cholecalciferol (VITAMIN D) 1000 UNITS tablet Take 1,000 Units by mouth daily.     cyanocobalamin (VITAMIN B12) 1000 MCG/ML injection Inject 1 mL into the muscle once a week for 3 weeks and then once every 30 days. 10 mL 0   ELDERBERRY PO Take by mouth daily.     folic acid (FOLVITE) 1 MG tablet Take 1 mg by mouth daily. (Except MTX days)     lovastatin (MEVACOR) 10 MG tablet TAKE 1 TABLET BY MOUTH EVERYDAY AT BEDTIME 90 tablet 1   magnesium oxide (MAG-OX) 400 MG tablet Take 400 mg by mouth daily.     methotrexate 50 MG/2ML injection INJECT 0.2MLS  SUBCUTANEOUSLY WEEKLY DISPENSE 2 VIALS  1   NEEDLE, DISP, 25 G (B-D DISP NEEDLE 25GX1") 25G X 1" MISC Use as directed to administer b12 injections 50 each 0   pantoprazole (PROTONIX) 40 MG tablet TAKE 1 TABLET BY MOUTH EVERY DAY 90 tablet 3   SYRINGE-NEEDLE, DISP, 3 ML 25G X 1" 3 ML MISC Use as directed for b12 injections 50 each 0   telmisartan (MICARDIS) 40 MG tablet TAKE 1 TABLET BY MOUTH EVERY DAY 90 tablet 1   Triamcinolone Acetonide (NASACORT AQ NA) Place into the nose as needed.     valACYclovir (VALTREX) 1000 MG tablet TAKE 1 TABLET BY MOUTH EVERY DAY 90 tablet 1   No facility-administered medications prior to visit.

## 2023-07-31 ENCOUNTER — Ambulatory Visit
Admission: RE | Admit: 2023-07-31 | Discharge: 2023-07-31 | Disposition: A | Discharge status: Home / Self Care | Payer: Medicare HMO | Source: Ambulatory Visit | Attending: Student in an Organized Health Care Education/Training Program | Admitting: Student in an Organized Health Care Education/Training Program

## 2023-07-31 DIAGNOSIS — R918 Other nonspecific abnormal finding of lung field: Secondary | ICD-10-CM | POA: Diagnosis not present

## 2023-07-31 DIAGNOSIS — I2510 Atherosclerotic heart disease of native coronary artery without angina pectoris: Secondary | ICD-10-CM | POA: Diagnosis not present

## 2023-07-31 DIAGNOSIS — R911 Solitary pulmonary nodule: Secondary | ICD-10-CM | POA: Diagnosis present

## 2023-07-31 DIAGNOSIS — I700 Atherosclerosis of aorta: Secondary | ICD-10-CM | POA: Diagnosis not present

## 2023-08-20 ENCOUNTER — Other Ambulatory Visit: Payer: Self-pay | Admitting: Internal Medicine

## 2023-09-08 ENCOUNTER — Ambulatory Visit
Admission: RE | Admit: 2023-09-08 | Discharge: 2023-09-08 | Disposition: A | Discharge status: Home / Self Care | Payer: Medicare HMO | Source: Ambulatory Visit | Attending: Internal Medicine | Admitting: Internal Medicine

## 2023-09-08 DIAGNOSIS — Z0000 Encounter for general adult medical examination without abnormal findings: Secondary | ICD-10-CM

## 2023-09-16 ENCOUNTER — Other Ambulatory Visit: Payer: Self-pay | Admitting: Internal Medicine

## 2023-09-29 ENCOUNTER — Ambulatory Visit: Payer: Medicare HMO | Admitting: Student in an Organized Health Care Education/Training Program

## 2023-09-29 VITALS — BP 104/60 | HR 65 | Temp 97.6°F | Ht 59.0 in | Wt 95.4 lb

## 2023-09-29 DIAGNOSIS — R911 Solitary pulmonary nodule: Secondary | ICD-10-CM

## 2023-09-29 NOTE — Progress Notes (Signed)
Synopsis: Referred in for pulmonary nodule by Dale [place], MD  Assessment & Plan:   1. Lung nodule  Presenting for follow up of a LLL ground glass nodule for which she has been undergoing radiographic surveillance. Patient with a history of pulmonary nodules that are secondary to RA, but the GGO certainly carries the risk of underlying low grade malignancy. Repeat CT scan is showing stability in the nodule, making a sinister process less likely. Given this, we will continue with radiographic monitoring and I will obtain a repeat chest CT in 2 years. Should the nodule be stable then, will cease radiographic monitoring.  -repeat chest CT in 2 years  Return in about 22 months (around 07/28/2025).  I spent 20 minutes caring for this patient today, including preparing to see the patient, obtaining a medical history , reviewing a separately obtained history, performing a medically appropriate examination and/or evaluation, counseling and educating the patient/family/caregiver, referring and communicating with other health care professionals (not separately reported), documenting clinical information in the electronic health record, and independently interpreting results (not separately reported/billed) and communicating results to the patient/family/caregiver  Raechel Chute, MD [facility] 09/29/2023 3:03 PM    End of visit medications:  No orders of the defined types were placed in this encounter.    Current Outpatient Medications:    abatacept (ORENCIA) 250 MG injection, Once every month, Disp: , Rfl:    amLODipine (NORVASC) 2.5 MG tablet, TAKE 1 TABLET BY MOUTH EVERY DAY, Disp: 90 tablet, Rfl: 1   aspirin 81 MG chewable tablet, Chew 81 mg by mouth daily., Disp: , Rfl:    calcium gluconate 650 MG tablet, Take 650 mg by mouth daily., Disp: , Rfl:    carbamide peroxide (DEBROX) 6.5 % OTIC solution, 5 drops in right ear q day.  Massage for approximately 5 minutes.,  Disp: 15 mL, Rfl: 0   cholecalciferol (VITAMIN D) 1000 UNITS tablet, Take 1,000 Units by mouth daily., Disp: , Rfl:    cyanocobalamin (VITAMIN B12) 1000 MCG/ML injection, INJECT 1 ML INTO THE MUSCLE ONCE A WEEK FOR 3 WEEKS AND THEN ONCE EVERY 30 DAYS., Disp: 3 mL, Rfl: 3   ELDERBERRY PO, Take by mouth daily., Disp: , Rfl:    folic acid (FOLVITE) 1 MG tablet, Take 1 mg by mouth daily. (Except MTX days), Disp: , Rfl:    lovastatin (MEVACOR) 10 MG tablet, TAKE 1 TABLET BY MOUTH EVERYDAY AT BEDTIME, Disp: 90 tablet, Rfl: 1   magnesium oxide (MAG-OX) 400 MG tablet, Take 400 mg by mouth daily., Disp: , Rfl:    methotrexate 50 MG/2ML injection, INJECT 0.2MLS SUBCUTANEOUSLY WEEKLY DISPENSE 2 VIALS, Disp: , Rfl: 1   NEEDLE, DISP, 25 G (B-D DISP NEEDLE 25GX1") 25G X 1" MISC, Use as directed to administer b12 injections, Disp: 50 each, Rfl: 0   pantoprazole (PROTONIX) 40 MG tablet, TAKE 1 TABLET BY MOUTH EVERY DAY, Disp: 90 tablet, Rfl: 3   SYRINGE-NEEDLE, DISP, 3 ML 25G X 1" 3 ML MISC, Use as directed for b12 injections, Disp: 50 each, Rfl: 0   telmisartan (MICARDIS) 40 MG tablet, TAKE 1 TABLET BY MOUTH EVERY DAY, Disp: 90 tablet, Rfl: 1   Triamcinolone Acetonide (NASACORT AQ NA), Place into the nose as needed., Disp: , Rfl:    valACYclovir (VALTREX) 1000 MG tablet, TAKE 1 TABLET BY MOUTH EVERY DAY, Disp: 90 tablet, Rfl: 1   Subjective:   PATIENT ID: Erica Little GENDER: female DOB: 09-12-1946, MRN: [id number]  Chief Complaint  Patient presents with   Follow-up    HPI  Patient is a pleasant 77 year old female presenting to clinic for follow up of pulmonary nodule.  Patient has had an unremarkable summer and remains asymptomatic. She's in her usual state of health and has no new symptoms.   Patient was previously established in our office and was followed by Dr. Nicholos Johns then by Dr. Jayme Cloud. She was noted to have multiple pulmonary nodules for which she was undergoing surveillance  imaging. Patient had a chest CT from 2021 where a ground glass nodule was noted to be stable and recommendation was made for 2 year interval follow up. She's had her repeat chest CT and is presenting for follow up today.  She has a history of RA that is under control and she follows closely with rheumatology.   Patient was a former smoker, having quit in 1990. She has    CT chest high-resolution 11/18/17: Compared to noncontrasted CT chest 05/19/2018; no evidence of interstitial lung disease, there are several tiny lung nodules, the largest of which is an 8 mm groundglass nodule, there are solid nodules which are 4 mm or less.  Review of the most recent scans shows no significant change from previous, largest nodule in the right middle lobe is 4 mm, largest groundglass nodule in the left lower lobe is 8 mm. PFT 11/18/17: Spirometry FVC 126% of predicted, FEV1 equals 101%, ratio was 65%, there was no bronchodilator administered. Lung volumes show TLC of 135%, vital capacity 126%.  RV to TLC ratio is normal. DLCO is normal at 95%.  Flow volume loop suggests obstruction.  Overall this test is suggestive of mild COPD CT chest 11/18/2018>> Largest nodule is 7 mm appears to be a groundglass nodule. CT chest 11/27/2020 1. Stable tiny bilateral pulmonary nodules. No new or progressive findings. 2. 7 mm ground-glass opacity in the left lower lobe is unchanged. This is stable comparing back to 11/18/2017 giving 3 years of stable imaging follow-up. Repeat CT is recommended every 2 years until 5 years of stability has been established.  CT Chest 07/31/2023  1. No acute findings. 2. Benign lung nodules. Ground-glass nodule in the left lower lobe has been stable for over 5 years. No additional follow-up recommended. No new lung nodules. 3. Aortic atherosclerosis and coronary artery calcifications.  Ancillary information including prior medications, full medical/surgical/family/social histories, and  PFTs (when available) are listed below and have been reviewed.   Review of Systems  Constitutional:  Negative for chills, fever, malaise/fatigue and weight loss.  Respiratory:  Negative for cough, hemoptysis, sputum production, shortness of breath and wheezing.   Cardiovascular:  Negative for chest pain.  Skin: Negative.   Psychiatric/Behavioral:  Negative for depression.      Objective:   Vitals:   09/29/23 1450  BP: 104/60  Pulse: 65  Temp: 97.6 F (36.4 C)  TempSrc: Temporal  SpO2: 98%  Weight: 95 lb 6.4 oz (43.3 kg)  Height: 4\' 11"  (1.499 m)   98% on RA BMI Readings from Last 3 Encounters:  09/29/23 19.27 kg/m  07/21/23 19.35 kg/m  06/13/23 19.23 kg/m   Wt Readings from Last 3 Encounters:  09/29/23 95 lb 6.4 oz (43.3 kg)  07/21/23 95 lb 12.8 oz (43.5 kg)  06/13/23 95 lb 3.2 oz (43.2 kg)    Physical Exam Constitutional:      General: She is not in acute distress.    Appearance: Normal appearance. She is not ill-appearing.  HENT:     Head: Normocephalic.     Mouth/Throat:     Mouth: Mucous membranes are moist.  Cardiovascular:     Rate and Rhythm: Normal rate and regular rhythm.     Pulses: Normal pulses.     Heart sounds: Normal heart sounds.  Pulmonary:     Effort: Pulmonary effort is normal. No respiratory distress.     Breath sounds: Normal breath sounds. No wheezing, rhonchi or rales.  Abdominal:     General: Abdomen is flat.     Palpations: Abdomen is soft.  Neurological:     General: No focal deficit present.     Mental Status: She is alert and oriented to person, place, and time.       Ancillary Information    Past Medical History:  Diagnosis Date   Family history of adverse reaction to anesthesia    Mother - PONV   Family history of brain aneurysm    GERD (gastroesophageal reflux disease)    Hyperlipidemia    Hypertension    MRSA (methicillin resistant Staphylococcus aureus)    skin infections   Rheumatoid arthritis(714.0)     transaminitis on MTX, remicade rxn, s/p sulfasalazine, orencia   Ulcer disease      Family History  Problem Relation Age of Onset   Heart disease Mother    Diabetes Mother    Hypercholesterolemia Mother    Heart disease Father    Heart disease Brother        MI age 69   Hypercholesterolemia Sister    Cancer Sister    Colon polyps Sister    Arthritis/Rheumatoid Paternal Aunt    Arthritis/Rheumatoid Paternal Uncle    Breast cancer Neg Hx      Past Surgical History:  Procedure Laterality Date   CATARACT EXTRACTION W/PHACO Left 01/22/2021   Procedure: CATARACT EXTRACTION PHACO AND INTRAOCULAR LENS PLACEMENT (IOC) LEFT;  Surgeon: Nevada Crane, MD;  Location: [facility];  Service: Ophthalmology;  Laterality: Left;  5.90 0:37.7   CATARACT EXTRACTION W/PHACO Right 02/05/2021   Procedure: CATARACT EXTRACTION PHACO AND INTRAOCULAR LENS PLACEMENT (IOC) RIGHT 4.33 00:34.6;  Surgeon: Nevada Crane, MD;  Location: [facility];  Service: Ophthalmology;  Laterality: Right;   CHOLECYSTECTOMY  2010   INTRACRANIAL ANEURYSM REPAIR  05/29/2016   MCP replacements  2001   left   TUBAL LIGATION      Social History   Socioeconomic History   Marital status: Widowed    Spouse name: Not on file   Number of children: 1   Years of education: Not on file   Highest education level: Not on file  Occupational History   Not on file  Tobacco Use   Smoking status: Former    Current packs/day: 0.00    Types: Cigarettes    Quit date: 12/30/1988    Years since quitting: 34.7   Smokeless tobacco: Never  Vaping Use   Vaping status: Never Used  Substance and Sexual Activity   Alcohol use: No    Alcohol/week: 0.0 standard drinks of alcohol   Drug use: No   Sexual activity: Yes  Other Topics Concern   Not on file  Social History Narrative   Enjoys dancing    Social Determinants of Health   Financial Resource Strain: Low Risk  (12/03/2022)   Overall Financial Resource Strain  (CARDIA)    Difficulty of Paying Living Expenses: Not hard at all  Food Insecurity: No Food Insecurity (12/03/2022)  Hunger Vital Sign    Worried About Running Out of Food in the Last Year: Never true    Ran Out of Food in the Last Year: Never true  Transportation Needs: No Transportation Needs (12/03/2022)   PRAPARE - Administrator, Civil Service (Medical): No    Lack of Transportation (Non-Medical): No  Physical Activity: Sufficiently Active (12/03/2022)   Exercise Vital Sign    Days of Exercise per Week: 5 days    Minutes of Exercise per Session: 30 min  Stress: No Stress Concern Present (12/03/2022)   [employer] of Occupational Health - Occupational Stress Questionnaire    Feeling of Stress : Not at all  Social Connections: Unknown (12/03/2022)   Social Connection and Isolation Panel [NHANES]    Frequency of Communication with Friends and Family: More than three times a week    Frequency of Social Gatherings with Friends and Family: More than three times a week    Attends Religious Services: Not on file    Active Member of Clubs or Organizations: Not on file    Attends Banker Meetings: Not on file    Marital Status: Widowed  Intimate Partner Violence: Not At Risk (12/03/2022)   Humiliation, Afraid, Rape, and Kick questionnaire    Fear of Current or Ex-Partner: No    Emotionally Abused: No    Physically Abused: No    Sexually Abused: No     Allergies  Allergen Reactions   Oxycodone Nausea Only   Remicade [Infliximab] Other (See Comments)    Lightheadedness   Simvastatin     Joint pain   Sulfa Antibiotics     Stomach ulcers   Zetia [Ezetimibe] Other (See Comments)    Muscle aches    Latex Rash    "Tape tears skin"   Tape Rash    "tape tears skin"      CBC    Component Value Date/Time   WBC 5.5 02/10/2023 0816   RBC 4.42 02/10/2023 0816   HGB 13.3 02/10/2023 0816   HCT 39.8 02/10/2023 0816   PLT 200.0 02/10/2023 0816   MCV 90.1  02/10/2023 0816   MCH 27.2 11/18/2015 2250   MCHC 33.3 02/10/2023 0816   RDW 14.9 02/10/2023 0816   LYMPHSABS 1.4 02/10/2023 0816   MONOABS 0.5 02/10/2023 0816   EOSABS 0.4 02/10/2023 0816   BASOSABS 0.1 02/10/2023 0816    Pulmonary Functions Testing Results:     No data to display          Outpatient Medications Prior to Visit  Medication Sig Dispense Refill   abatacept (ORENCIA) 250 MG injection Once every month     amLODipine (NORVASC) 2.5 MG tablet TAKE 1 TABLET BY MOUTH EVERY DAY 90 tablet 1   aspirin 81 MG chewable tablet Chew 81 mg by mouth daily.     calcium gluconate 650 MG tablet Take 650 mg by mouth daily.     carbamide peroxide (DEBROX) 6.5 % OTIC solution 5 drops in right ear q day.  Massage for approximately 5 minutes. 15 mL 0   cholecalciferol (VITAMIN D) 1000 UNITS tablet Take 1,000 Units by mouth daily.     cyanocobalamin (VITAMIN B12) 1000 MCG/ML injection INJECT 1 ML INTO THE MUSCLE ONCE A WEEK FOR 3 WEEKS AND THEN ONCE EVERY 30 DAYS. 3 mL 3   ELDERBERRY PO Take by mouth daily.     folic acid (FOLVITE) 1 MG tablet Take 1 mg by mouth daily. (Except  MTX days)     lovastatin (MEVACOR) 10 MG tablet TAKE 1 TABLET BY MOUTH EVERYDAY AT BEDTIME 90 tablet 1   magnesium oxide (MAG-OX) 400 MG tablet Take 400 mg by mouth daily.     methotrexate 50 MG/2ML injection INJECT 0.2MLS SUBCUTANEOUSLY WEEKLY DISPENSE 2 VIALS  1   NEEDLE, DISP, 25 G (B-D DISP NEEDLE 25GX1") 25G X 1" MISC Use as directed to administer b12 injections 50 each 0   pantoprazole (PROTONIX) 40 MG tablet TAKE 1 TABLET BY MOUTH EVERY DAY 90 tablet 3   SYRINGE-NEEDLE, DISP, 3 ML 25G X 1" 3 ML MISC Use as directed for b12 injections 50 each 0   telmisartan (MICARDIS) 40 MG tablet TAKE 1 TABLET BY MOUTH EVERY DAY 90 tablet 1   Triamcinolone Acetonide (NASACORT AQ NA) Place into the nose as needed.     valACYclovir (VALTREX) 1000 MG tablet TAKE 1 TABLET BY MOUTH EVERY DAY 90 tablet 1   No facility-administered  medications prior to visit.

## 2023-09-29 NOTE — Patient Instructions (Signed)
We will need to get a CT scan in 2 years. Given technological constraints, we are unable to place an order for the CT today. I have requested a follow up in 22 months so that we can order the CT scan at that point.

## 2023-10-03 ENCOUNTER — Telehealth: Payer: Self-pay | Admitting: Internal Medicine

## 2023-10-03 DIAGNOSIS — R739 Hyperglycemia, unspecified: Secondary | ICD-10-CM

## 2023-10-03 DIAGNOSIS — E7800 Pure hypercholesterolemia, unspecified: Secondary | ICD-10-CM

## 2023-10-03 NOTE — Telephone Encounter (Signed)
Patient need lab orders.

## 2023-10-03 NOTE — Telephone Encounter (Signed)
Lab orders placed.  

## 2023-10-03 NOTE — Addendum Note (Signed)
Addended by: Rita Ohara D on: 10/03/2023 03:19 PM   Modules accepted: Orders

## 2023-10-10 ENCOUNTER — Other Ambulatory Visit (INDEPENDENT_AMBULATORY_CARE_PROVIDER_SITE_OTHER): Payer: Medicare HMO

## 2023-10-10 DIAGNOSIS — R739 Hyperglycemia, unspecified: Secondary | ICD-10-CM | POA: Diagnosis not present

## 2023-10-10 DIAGNOSIS — E7800 Pure hypercholesterolemia, unspecified: Secondary | ICD-10-CM

## 2023-10-10 LAB — BASIC METABOLIC PANEL WITH GFR
BUN: 13 mg/dL (ref 6–23)
CO2: 25 meq/L (ref 19–32)
Calcium: 9.2 mg/dL (ref 8.4–10.5)
Chloride: 100 meq/L (ref 96–112)
Creatinine, Ser: 0.81 mg/dL (ref 0.40–1.20)
GFR: 69.34 mL/min (ref >60.00)
Glucose, Bld: 83 mg/dL (ref 70–99)
Potassium: 4.2 meq/L (ref 3.5–5.1)
Sodium: 134 meq/L — ABNORMAL LOW (ref 135–145)

## 2023-10-10 LAB — HEMOGLOBIN A1C: Hgb A1c MFr Bld: 5.8 % (ref 4.6–6.5)

## 2023-10-10 LAB — LIPID PANEL
Cholesterol: 222 mg/dL — ABNORMAL HIGH (ref 0–200)
HDL: 62.2 mg/dL (ref >39.00)
LDL Cholesterol: 143 mg/dL — ABNORMAL HIGH (ref 0–99)
NonHDL: 159.67
Total CHOL/HDL Ratio: 4
Triglycerides: 84 mg/dL (ref 0.0–149.0)
VLDL: 16.8 mg/dL (ref 0.0–40.0)

## 2023-10-10 LAB — TSH: TSH: 4.19 u[IU]/mL (ref 0.35–5.50)

## 2023-10-10 LAB — HEPATIC FUNCTION PANEL
ALT: 12 U/L (ref 0–35)
AST: 20 U/L (ref 0–37)
Albumin: 4.3 g/dL (ref 3.5–5.2)
Alkaline Phosphatase: 81 U/L (ref 39–117)
Bilirubin, Direct: 0.1 mg/dL (ref 0.0–0.3)
Total Bilirubin: 0.8 mg/dL (ref 0.2–1.2)
Total Protein: 6 g/dL (ref 6.0–8.3)

## 2023-10-14 ENCOUNTER — Ambulatory Visit (INDEPENDENT_AMBULATORY_CARE_PROVIDER_SITE_OTHER): Payer: Medicare HMO | Admitting: Internal Medicine

## 2023-10-14 ENCOUNTER — Encounter: Payer: Self-pay | Admitting: Internal Medicine

## 2023-10-14 VITALS — BP 116/70 | HR 76 | Temp 98.2°F | Resp 16 | Ht 59.0 in | Wt 94.6 lb

## 2023-10-14 DIAGNOSIS — M791 Myalgia, unspecified site: Secondary | ICD-10-CM | POA: Diagnosis not present

## 2023-10-14 DIAGNOSIS — R918 Other nonspecific abnormal finding of lung field: Secondary | ICD-10-CM

## 2023-10-14 DIAGNOSIS — R739 Hyperglycemia, unspecified: Secondary | ICD-10-CM | POA: Diagnosis not present

## 2023-10-14 DIAGNOSIS — Z0000 Encounter for general adult medical examination without abnormal findings: Secondary | ICD-10-CM | POA: Diagnosis not present

## 2023-10-14 DIAGNOSIS — J439 Emphysema, unspecified: Secondary | ICD-10-CM

## 2023-10-14 DIAGNOSIS — T466X5A Adverse effect of antihyperlipidemic and antiarteriosclerotic drugs, initial encounter: Secondary | ICD-10-CM

## 2023-10-14 DIAGNOSIS — Z8679 Personal history of other diseases of the circulatory system: Secondary | ICD-10-CM

## 2023-10-14 DIAGNOSIS — I10 Essential (primary) hypertension: Secondary | ICD-10-CM

## 2023-10-14 DIAGNOSIS — M069 Rheumatoid arthritis, unspecified: Secondary | ICD-10-CM

## 2023-10-14 DIAGNOSIS — E7800 Pure hypercholesterolemia, unspecified: Secondary | ICD-10-CM | POA: Diagnosis not present

## 2023-10-14 DIAGNOSIS — I700 Atherosclerosis of aorta: Secondary | ICD-10-CM

## 2023-10-14 DIAGNOSIS — D649 Anemia, unspecified: Secondary | ICD-10-CM

## 2023-10-14 DIAGNOSIS — Z9889 Other specified postprocedural states: Secondary | ICD-10-CM

## 2023-10-14 DIAGNOSIS — E871 Hypo-osmolality and hyponatremia: Secondary | ICD-10-CM

## 2023-10-14 DIAGNOSIS — I779 Disorder of arteries and arterioles, unspecified: Secondary | ICD-10-CM

## 2023-10-14 DIAGNOSIS — F439 Reaction to severe stress, unspecified: Secondary | ICD-10-CM

## 2023-10-14 MED ORDER — CYANOCOBALAMIN 1000 MCG/ML IJ SOLN: INTRAMUSCULAR | 3 refills | Status: DC

## 2023-10-14 MED ORDER — LOVASTATIN 10 MG PO TABS: 10.0000 mg | ORAL_TABLET | ORAL | 1 refills | Status: AC

## 2023-10-14 NOTE — Progress Notes (Signed)
Subjective:    Patient ID: Erica Little, female    DOB: 25-Mar-1946, 77 y.o.   MRN: [id number]  Patient here for  Chief Complaint  Patient presents with   Annual Exam    HPI Here for a physical exam. Saw pulmonary 09/29/23.  Stable lung nodule.  Recommended f/u CT in 2 years. Seeing rheumatology - orencia infusion. Joints stable.  Staying active.  No chest pain or sob reported.  No cough or congestion.  No abdominal pain.  Takes stool softener to help keep bowels regular.  Discussed f/u carotid.  Declines f/u colonoscopy.     Past Medical History:  Diagnosis Date   Family history of adverse reaction to anesthesia    Mother - PONV   Family history of brain aneurysm    GERD (gastroesophageal reflux disease)    Hyperlipidemia    Hypertension    MRSA (methicillin resistant Staphylococcus aureus)    skin infections   Rheumatoid arthritis(714.0)    transaminitis on MTX, remicade rxn, s/p sulfasalazine, orencia   Ulcer disease    Past Surgical History:  Procedure Laterality Date   CATARACT EXTRACTION W/PHACO Left 01/22/2021   Procedure: CATARACT EXTRACTION PHACO AND INTRAOCULAR LENS PLACEMENT (IOC) LEFT;  Surgeon: Nevada Crane, MD;  Location: [facility];  Service: Ophthalmology;  Laterality: Left;  5.90 0:37.7   CATARACT EXTRACTION W/PHACO Right 02/05/2021   Procedure: CATARACT EXTRACTION PHACO AND INTRAOCULAR LENS PLACEMENT (IOC) RIGHT 4.33 00:34.6;  Surgeon: Nevada Crane, MD;  Location: [facility];  Service: Ophthalmology;  Laterality: Right;   CHOLECYSTECTOMY  2010   INTRACRANIAL ANEURYSM REPAIR  05/29/2016   MCP replacements  2001   left   TUBAL LIGATION     Family History  Problem Relation Age of Onset   Heart disease Mother    Diabetes Mother    Hypercholesterolemia Mother    Heart disease Father    Heart disease Brother        MI age 48   Hypercholesterolemia Sister    Cancer Sister    Colon polyps Sister    Arthritis/Rheumatoid  Paternal Aunt    Arthritis/Rheumatoid Paternal Uncle    Breast cancer Neg Hx    Social History   Socioeconomic History   Marital status: Widowed    Spouse name: Not on file   Number of children: 1   Years of education: Not on file   Highest education level: Not on file  Occupational History   Not on file  Tobacco Use   Smoking status: Former    Current packs/day: 0.00    Types: Cigarettes    Quit date: 12/30/1988    Years since quitting: 34.8   Smokeless tobacco: Never  Vaping Use   Vaping status: Never Used  Substance and Sexual Activity   Alcohol use: No    Alcohol/week: 0.0 standard drinks of alcohol   Drug use: No   Sexual activity: Yes  Other Topics Concern   Not on file  Social History Narrative   Enjoys dancing    Social Determinants of Health   Financial Resource Strain: Low Risk  (12/03/2022)   Overall Financial Resource Strain (CARDIA)    Difficulty of Paying Living Expenses: Not hard at all  Food Insecurity: No Food Insecurity (12/03/2022)   Hunger Vital Sign    Worried About Running Out of Food in the Last Year: Never true    Ran Out of Food in the Last Year: Never true  Transportation Needs:  No Transportation Needs (12/03/2022)   PRAPARE - Administrator, Civil Service (Medical): No    Lack of Transportation (Non-Medical): No  Physical Activity: Sufficiently Active (12/03/2022)   Exercise Vital Sign    Days of Exercise per Week: 5 days    Minutes of Exercise per Session: 30 min  Stress: No Stress Concern Present (12/03/2022)   [employer] of Occupational Health - Occupational Stress Questionnaire    Feeling of Stress : Not at all  Social Connections: Unknown (12/03/2022)   Social Connection and Isolation Panel [NHANES]    Frequency of Communication with Friends and Family: More than three times a week    Frequency of Social Gatherings with Friends and Family: More than three times a week    Attends Religious Services: Not on file     Active Member of Clubs or Organizations: Not on file    Attends Banker Meetings: Not on file    Marital Status: Widowed     Review of Systems  Constitutional:  Negative for fatigue and unexpected weight change.  HENT:  Negative for congestion, sinus pressure and sore throat.   Eyes:  Negative for pain and visual disturbance.  Respiratory:  Negative for cough, chest tightness and shortness of breath.   Cardiovascular:  Negative for chest pain, palpitations and leg swelling.  Gastrointestinal:  Negative for abdominal pain, diarrhea, nausea and vomiting.  Genitourinary:  Negative for difficulty urinating and dysuria.  Musculoskeletal:  Negative for myalgias.  Skin:  Negative for color change and rash.  Neurological:  Negative for dizziness and headaches.  Hematological:  Negative for adenopathy. Does not bruise/bleed easily.  Psychiatric/Behavioral:  Negative for agitation and dysphoric mood.        Objective:     BP 116/70   Pulse 76   Temp 98.2 F (36.8 C)   Resp 16   Ht 4\' 11"  (1.499 m)   Wt 94 lb 9.6 oz (42.9 kg)   SpO2 98%   BMI 19.11 kg/m  Wt Readings from Last 3 Encounters:  10/14/23 94 lb 9.6 oz (42.9 kg)  09/29/23 95 lb 6.4 oz (43.3 kg)  07/21/23 95 lb 12.8 oz (43.5 kg)    Physical Exam Vitals reviewed.  Constitutional:      General: She is not in acute distress.    Appearance: Normal appearance. She is well-developed.  HENT:     Head: Normocephalic and atraumatic.     Right Ear: External ear normal.     Left Ear: External ear normal.  Eyes:     General: No scleral icterus.       Right eye: No discharge.        Left eye: No discharge.     Conjunctiva/sclera: Conjunctivae normal.  Neck:     Thyroid: No thyromegaly.  Cardiovascular:     Rate and Rhythm: Normal rate and regular rhythm.  Pulmonary:     Effort: No tachypnea, accessory muscle usage or respiratory distress.     Breath sounds: Normal breath sounds. No decreased breath sounds or  wheezing.  Chest:  Breasts:    Right: No inverted nipple, mass, nipple discharge or tenderness (no axillary adenopathy).     Left: No inverted nipple, mass, nipple discharge or tenderness (no axilarry adenopathy).  Abdominal:     General: Bowel sounds are normal.     Palpations: Abdomen is soft.     Tenderness: There is no abdominal tenderness.  Musculoskeletal:  General: No swelling or tenderness.     Cervical back: Neck supple. No tenderness.  Lymphadenopathy:     Cervical: No cervical adenopathy.  Skin:    Findings: No erythema or rash.  Neurological:     Mental Status: She is alert and oriented to person, place, and time.  Psychiatric:        Mood and Affect: Mood normal.        Behavior: Behavior normal.      Outpatient Encounter Medications as of 10/14/2023  Medication Sig   abatacept (ORENCIA) 250 MG injection Once every month   amLODipine (NORVASC) 2.5 MG tablet TAKE 1 TABLET BY MOUTH EVERY DAY   aspirin 81 MG chewable tablet Chew 81 mg by mouth daily.   calcium gluconate 650 MG tablet Take 650 mg by mouth daily.   carbamide peroxide (DEBROX) 6.5 % OTIC solution 5 drops in right ear q day.  Massage for approximately 5 minutes.   cholecalciferol (VITAMIN D) 1000 UNITS tablet Take 1,000 Units by mouth daily.   cyanocobalamin (VITAMIN B12) 1000 MCG/ML injection Inject 1 mL into the muscle once every 30 days.   ELDERBERRY PO Take by mouth daily.   folic acid (FOLVITE) 1 MG tablet Take 1 mg by mouth daily. (Except MTX days)   lovastatin (MEVACOR) 10 MG tablet Take 1 tablet (10 mg total) by mouth 3 (three) times a week.   magnesium oxide (MAG-OX) 400 MG tablet Take 400 mg by mouth daily.   methotrexate 50 MG/2ML injection INJECT 0.2MLS SUBCUTANEOUSLY WEEKLY DISPENSE 2 VIALS   NEEDLE, DISP, 25 G (B-D DISP NEEDLE 25GX1") 25G X 1" MISC Use as directed to administer b12 injections   pantoprazole (PROTONIX) 40 MG tablet TAKE 1 TABLET BY MOUTH EVERY DAY   SYRINGE-NEEDLE,  DISP, 3 ML 25G X 1" 3 ML MISC Use as directed for b12 injections   telmisartan (MICARDIS) 40 MG tablet TAKE 1 TABLET BY MOUTH EVERY DAY   Triamcinolone Acetonide (NASACORT AQ NA) Place into the nose as needed.   valACYclovir (VALTREX) 1000 MG tablet TAKE 1 TABLET BY MOUTH EVERY DAY   [DISCONTINUED] cyanocobalamin (VITAMIN B12) 1000 MCG/ML injection INJECT 1 ML INTO THE MUSCLE ONCE A WEEK FOR 3 WEEKS AND THEN ONCE EVERY 30 DAYS.   [DISCONTINUED] lovastatin (MEVACOR) 10 MG tablet TAKE 1 TABLET BY MOUTH EVERYDAY AT BEDTIME   No facility-administered encounter medications on file as of 10/14/2023.     Lab Results  Component Value Date   WBC 5.5 02/10/2023   HGB 13.3 02/10/2023   HCT 39.8 02/10/2023   PLT 200.0 02/10/2023   GLUCOSE 83 10/10/2023   CHOL 222 (H) 10/10/2023   TRIG 84.0 10/10/2023   HDL 62.20 10/10/2023   LDLDIRECT 167.8 12/20/2013   LDLCALC 143 (H) 10/10/2023   ALT 12 10/10/2023   AST 20 10/10/2023   NA 134 (L) 10/10/2023   K 4.2 10/10/2023   CL 100 10/10/2023   CREATININE 0.81 10/10/2023   BUN 13 10/10/2023   CO2 25 10/10/2023   TSH 4.19 10/10/2023   INR 1.1 (H) 12/06/2013   HGBA1C 5.8 10/10/2023    MM 3D SCREENING MAMMOGRAM BILATERAL BREAST  Result Date: 09/09/2023 CLINICAL DATA:  Screening. EXAM: DIGITAL SCREENING BILATERAL MAMMOGRAM WITH TOMOSYNTHESIS AND CAD TECHNIQUE: Bilateral screening digital craniocaudal and mediolateral oblique mammograms were obtained. Bilateral screening digital breast tomosynthesis was performed. The images were evaluated with computer-aided detection. COMPARISON:  Previous exam(s). ACR Breast Density Category b: There are scattered areas of  fibroglandular density. FINDINGS: There are no findings suspicious for malignancy. IMPRESSION: No mammographic evidence of malignancy. A result letter of this screening mammogram will be mailed directly to the patient. RECOMMENDATION: Screening mammogram in one year. (Code:SM-B-01Y) BI-RADS CATEGORY  1:  Negative. Electronically Signed   By: Frederico Hamman M.D.   On: 09/09/2023 11:12       Assessment & Plan:  Routine general medical examination at a health care facility  Hypercholesteremia Assessment & Plan: Intolerant to praluent. Lovastatin three days per week.  Tolerating.  Did not tolerate 4 days per week. Continue current dose.  Follow lipid panel and liver function tests.      Orders: -     Lovastatin; Take 1 tablet (10 mg total) by mouth 3 (three) times a week.  Dispense: 90 tablet; Refill: 1 -     CBC with Differential/Platelet; Future -     Basic metabolic panel; Future -     Lipid panel; Future -     Hepatic function panel; Future  Myalgia due to statin -     Lovastatin; Take 1 tablet (10 mg total) by mouth 3 (three) times a week.  Dispense: 90 tablet; Refill: 1  Hyperglycemia Assessment & Plan: Follow met b and a1c.   Orders: -     Hemoglobin A1c; Future  Stress Assessment & Plan: Overall appears to be handling things well.  Follow.    Rheumatoid arthritis, involving unspecified site, unspecified whether rheumatoid factor present [facility]) Assessment & Plan: On MTX and orencia.  Stable.  Followed by rheumatology.    Lung nodules Assessment & Plan: Pulmonary f/u 06/2023 - f/u scan.  Saw Dr Aundria Rud - 08/2023 - CT stable.  Recommended f/u CT in 2 years.    Hyponatremia Assessment & Plan: Has seen nephrology.  She is eating.  Sodium stable.    Primary hypertension Assessment & Plan: Continue micardis and amlodipine. Continue to spot check pressures.  Follow metabolic panel.    History of cerebral aneurysm  Assessment & Plan: S/p stenting.  Being followed at [facility]. On aspirin.   Last evaluated 05/2022.  MRA  06/12/22 - ok.  Recommended continuing aspirin.  F/u in 3 years.    Health care maintenance Assessment & Plan: Physical today 10/14/23.  Mammogram 09/08/23 - Birads I.  Colonoscopy 2015.   COPD with chronic bronchitis and emphysema  (HCC) Assessment & Plan: Has seen Dr Jayme Cloud.  Breathing stable.     Bilateral carotid artery disease, unspecified type [facility]) Assessment & Plan: Carotid ultrasound 10/2021- Dr Gilda Crease - recommended f/u carotid ultrasound in 2 years. Continue statin therapy.    Aortic atherosclerosis (HCC) Assessment & Plan: Taking statin three days per week. Did not tolerate 4 days per week.  Reviewed labs.  Did not tolerate praluent.  Continue current medication regimen.   Lab Results  Component Value Date   CHOL 222 (H) 10/10/2023   HDL 62.20 10/10/2023   LDLCALC 143 (H) 10/10/2023   LDLDIRECT 167.8 12/20/2013   TRIG 84.0 10/10/2023   CHOLHDL 4 10/10/2023      Anemia, unspecified type Assessment & Plan: Follow cbc.    Other orders -     Cyanocobalamin; Inject 1 mL into the muscle once every 30 days.  Dispense: 3 mL; Refill: 3     Dale Pinal, MD

## 2023-10-19 ENCOUNTER — Encounter: Payer: Self-pay | Admitting: Internal Medicine

## 2023-10-19 NOTE — Assessment & Plan Note (Signed)
S/p stenting.  Being followed at [facility]. On aspirin.   Last evaluated 05/2022.  MRA  06/12/22 - ok.  Recommended continuing aspirin.  F/u in 3 years.  

## 2023-10-19 NOTE — Assessment & Plan Note (Signed)
Intolerant to praluent. Lovastatin three days per week.  Tolerating.  Did not tolerate 4 days per week. Continue current dose.  Follow lipid panel and liver function tests.

## 2023-10-19 NOTE — Assessment & Plan Note (Signed)
Overall appears to be handling things well.  Follow.  ?

## 2023-10-19 NOTE — Assessment & Plan Note (Signed)
Carotid ultrasound 10/2021- Dr Gilda Crease - recommended f/u carotid ultrasound in 2 years. Continue statin therapy.

## 2023-10-19 NOTE — Assessment & Plan Note (Signed)
On MTX and orencia.  Stable.  Followed by rheumatology.

## 2023-10-19 NOTE — Assessment & Plan Note (Signed)
Continue micardis and amlodipine. Continue to spot check pressures.  Follow metabolic panel.  

## 2023-10-19 NOTE — Assessment & Plan Note (Signed)
Follow cbc.  

## 2023-10-19 NOTE — Assessment & Plan Note (Signed)
Physical today 10/14/23.  Mammogram 09/08/23 - Birads I.  Colonoscopy 2015.

## 2023-10-19 NOTE — Assessment & Plan Note (Signed)
Has seen nephrology.  She is eating.  Sodium stable.

## 2023-10-19 NOTE — Assessment & Plan Note (Signed)
Has seen Dr Jayme Cloud.  Breathing stable.

## 2023-10-19 NOTE — Assessment & Plan Note (Addendum)
Pulmonary f/u 06/2023 - f/u scan.  Saw Dr Aundria Rud - 08/2023 - CT stable.  Recommended f/u CT in 2 years.

## 2023-10-19 NOTE — Assessment & Plan Note (Signed)
Follow met b and a1c.  

## 2023-10-19 NOTE — Assessment & Plan Note (Signed)
Taking statin three days per week. Did not tolerate 4 days per week.  Reviewed labs.  Did not tolerate praluent.  Continue current medication regimen.   Lab Results  Component Value Date   CHOL 222 (H) 10/10/2023   HDL 62.20 10/10/2023   LDLCALC 143 (H) 10/10/2023   LDLDIRECT 167.8 12/20/2013   TRIG 84.0 10/10/2023   CHOLHDL 4 10/10/2023

## 2023-11-20 ENCOUNTER — Telehealth: Payer: Self-pay | Admitting: Internal Medicine

## 2023-11-20 NOTE — Telephone Encounter (Signed)
Copied from CRM [id number]. Topic: Medicare AWV >> Nov 20, 2023  1:48 PM Payton Doughty wrote: Reason for CRM: Called LVM 11/20/2023 to schedule Annual Wellness Visit  Verlee Rossetti; Care Guide Ambulatory Clinical Support [place] l [facility] Direct Dial: [phone]

## 2023-12-09 ENCOUNTER — Ambulatory Visit (INDEPENDENT_AMBULATORY_CARE_PROVIDER_SITE_OTHER): Payer: Medicare HMO | Admitting: *Deleted

## 2023-12-09 VITALS — Ht 59.0 in | Wt 96.0 lb

## 2023-12-09 DIAGNOSIS — Z0000 Encounter for general adult medical examination without abnormal findings: Secondary | ICD-10-CM

## 2023-12-09 NOTE — Progress Notes (Signed)
Subjective:   Erica Little is a 77 y.o. female who presents for Medicare Annual (Subsequent) preventive examination.  Visit Complete: Virtual I connected with  Charlann Noss on 12/09/23 by a audio enabled telemedicine application and verified that I am speaking with the correct person using two identifiers.  Patient Location: Home  Provider Location: Office/Clinic  I discussed the limitations of evaluation and management by telemedicine. The patient expressed understanding and agreed to proceed.  Vital Signs: Because this visit was a virtual/telehealth visit, some criteria may be missing or patient reported. Any vitals not documented were not able to be obtained and vitals that have been documented are patient reported.   Cardiac Risk Factors include: advanced age (>91men, >53 women);dyslipidemia;hypertension     Objective:    Today's Vitals   12/09/23 0818  Weight: 96 lb (43.5 kg)  Height: 4\' 11"  (1.499 m)   Body mass index is 19.39 kg/m.     12/09/2023    8:37 AM 12/03/2022    8:48 AM 11/29/2021   10:48 AM 02/05/2021   12:28 PM 01/22/2021   11:02 AM 11/28/2020   10:41 AM 11/24/2019   10:56 AM  Advanced Directives  Does Patient Have a Medical Advance Directive? Yes Yes Yes  Yes Yes Yes  Type of Estate agent of [place];Living will Healthcare Power of [place];Living will Healthcare Power of [place];Living will Living will Healthcare Power of [place];Living will Healthcare Power of [place];Living will Healthcare Power of [place];Living will  Does patient want to make changes to medical advance directive?  No - Patient declined No - Patient declined  No - Patient declined No - Patient declined No - Patient declined  Copy of Healthcare Power of Attorney in Chart? No - copy requested No - copy requested No - copy requested Yes - validated most recent copy scanned in chart (See row information) No - copy requested No - copy requested No - copy  requested    Current Medications (verified) Outpatient Encounter Medications as of 12/09/2023  Medication Sig   abatacept (ORENCIA) 250 MG injection Once every month   amLODipine (NORVASC) 2.5 MG tablet TAKE 1 TABLET BY MOUTH EVERY DAY   aspirin 81 MG chewable tablet Chew 81 mg by mouth daily.   calcium gluconate 650 MG tablet Take 650 mg by mouth daily.   carbamide peroxide (DEBROX) 6.5 % OTIC solution 5 drops in right ear q day.  Massage for approximately 5 minutes.   cholecalciferol (VITAMIN D) 1000 UNITS tablet Take 1,000 Units by mouth daily.   cyanocobalamin (VITAMIN B12) 1000 MCG/ML injection Inject 1 mL into the muscle once every 30 days.   ELDERBERRY PO Take by mouth daily.   folic acid (FOLVITE) 1 MG tablet Take 1 mg by mouth daily. (Except MTX days)   lovastatin (MEVACOR) 10 MG tablet Take 1 tablet (10 mg total) by mouth 3 (three) times a week.   magnesium oxide (MAG-OX) 400 MG tablet Take 400 mg by mouth daily.   methotrexate 50 MG/2ML injection INJECT 0.2MLS SUBCUTANEOUSLY WEEKLY DISPENSE 2 VIALS   NEEDLE, DISP, 25 G (B-D DISP NEEDLE 25GX1") 25G X 1" MISC Use as directed to administer b12 injections   pantoprazole (PROTONIX) 40 MG tablet TAKE 1 TABLET BY MOUTH EVERY DAY   SYRINGE-NEEDLE, DISP, 3 ML 25G X 1" 3 ML MISC Use as directed for b12 injections   telmisartan (MICARDIS) 40 MG tablet TAKE 1 TABLET BY MOUTH EVERY DAY   Triamcinolone Acetonide (NASACORT AQ  NA) Place into the nose as needed.   valACYclovir (VALTREX) 1000 MG tablet TAKE 1 TABLET BY MOUTH EVERY DAY   No facility-administered encounter medications on file as of 12/09/2023.    Allergies (verified) Oxycodone, Remicade [infliximab], Simvastatin, Sulfa antibiotics, Zetia [ezetimibe], Latex, and Tape   History: Past Medical History:  Diagnosis Date   Family history of adverse reaction to anesthesia    Mother - PONV   Family history of brain aneurysm    GERD (gastroesophageal reflux disease)     Hyperlipidemia    Hypertension    MRSA (methicillin resistant Staphylococcus aureus)    skin infections   Rheumatoid arthritis(714.0)    transaminitis on MTX, remicade rxn, s/p sulfasalazine, orencia   Ulcer disease    Past Surgical History:  Procedure Laterality Date   CATARACT EXTRACTION W/PHACO Left 01/22/2021   Procedure: CATARACT EXTRACTION PHACO AND INTRAOCULAR LENS PLACEMENT (IOC) LEFT;  Surgeon: Nevada Crane, MD;  Location: [facility];  Service: Ophthalmology;  Laterality: Left;  5.90 0:37.7   CATARACT EXTRACTION W/PHACO Right 02/05/2021   Procedure: CATARACT EXTRACTION PHACO AND INTRAOCULAR LENS PLACEMENT (IOC) RIGHT 4.33 00:34.6;  Surgeon: Nevada Crane, MD;  Location[facility];  Service: Ophthalmology;  Laterality: Right;   CHOLECYSTECTOMY  2010   INTRACRANIAL ANEURYSM REPAIR  05/29/2016   MCP replacements  2001   left   TUBAL LIGATION     Family History  Problem Relation Age of Onset   Heart disease Mother    Diabetes Mother    Hypercholesterolemia Mother    Heart disease Father    Heart disease Brother        MI age 41   Hypercholesterolemia Sister    Cancer Sister    Colon polyps Sister    Arthritis/Rheumatoid Paternal Aunt    Arthritis/Rheumatoid Paternal Uncle    Breast cancer Neg Hx    Social History   Socioeconomic History   Marital status: Widowed    Spouse name: Not on file   Number of children: 1   Years of education: Not on file   Highest education level: Not on file  Occupational History   Not on file  Tobacco Use   Smoking status: Former    Current packs/day: 0.00    Types: Cigarettes    Quit date: 12/30/1988    Years since quitting: 34.9   Smokeless tobacco: Never  Vaping Use   Vaping status: Never Used  Substance and Sexual Activity   Alcohol use: No    Alcohol/week: 0.0 standard drinks of alcohol   Drug use: No   Sexual activity: Yes  Other Topics Concern   Not on file  Social History Narrative    Enjoys dancing    Social Determinants of Health   Financial Resource Strain: Low Risk  (12/09/2023)   Overall Financial Resource Strain (CARDIA)    Difficulty of Paying Living Expenses: Not hard at all  Food Insecurity: No Food Insecurity (12/09/2023)   Hunger Vital Sign    Worried About Running Out of Food in the Last Year: Never true    Ran Out of Food in the Last Year: Never true  Transportation Needs: No Transportation Needs (12/09/2023)   PRAPARE - Administrator, Civil Service (Medical): No    Lack of Transportation (Non-Medical): No  Physical Activity: Sufficiently Active (12/09/2023)   Exercise Vital Sign    Days of Exercise per Week: 7 days    Minutes of Exercise per Session: 50  min  Stress: No Stress Concern Present (12/09/2023)   [employer] of Occupational Health - Occupational Stress Questionnaire    Feeling of Stress : Not at all  Social Connections: Moderately Integrated (12/09/2023)   Social Connection and Isolation Panel [NHANES]    Frequency of Communication with Friends and Family: More than three times a week    Frequency of Social Gatherings with Friends and Family: More than three times a week    Attends Religious Services: More than 4 times per year    Active Member of [employer] or Organizations: Yes    Attends Banker Meetings: More than 4 times per year    Marital Status: Widowed    Tobacco Counseling Counseling given: Not Answered   Clinical Intake:  Pre-visit preparation completed: Yes  Pain : No/denies pain     BMI - recorded: 19.39 Nutritional Status: BMI of 19-24  Normal Nutritional Risks: None Diabetes: No  How often do you need to have someone help you when you read instructions, pamphlets, or other written materials from your doctor or pharmacy?: 1 - Never  Interpreter Needed?: No  Information entered by :: R. Maridel Pixler LPN   Activities of Daily Living    12/09/2023    8:20 AM  In your present state of  health, do you have any difficulty performing the following activities:  Hearing? 0  Vision? 0  Comment readers  Difficulty concentrating or making decisions? 0  Walking or climbing stairs? 0  Dressing or bathing? 0  Doing errands, shopping? 0  Preparing Food and eating ? N  Using the Toilet? N  In the past six months, have you accidently leaked urine? N  Do you have problems with loss of bowel control? N  Managing your Medications? N  Managing your Finances? N  Housekeeping or managing your Housekeeping? N    Patient Care Team: Dale [place], MD as PCP - General (Internal Medicine)  Indicate any recent Medical Services you may have received from other than [facility] providers in the past year (date may be approximate).     Assessment:   This is a routine wellness examination for Erica Little.  Hearing/Vision screen Hearing Screening - Comments:: No issues Vision Screening - Comments:: readers   Goals Addressed             This Visit's Progress    Follow up with Primary Care Provider       Patient Stated       Wants to continue to walk and exercise       Depression Screen    12/09/2023    8:29 AM 12/03/2022    8:36 AM 11/08/2022    9:19 AM 08/13/2022   10:42 AM 05/30/2022   10:50 AM 04/11/2022    1:35 PM 11/29/2021   10:41 AM  PHQ 2/9 Scores  PHQ - 2 Score 0 0 0 0 0 0 0  PHQ- 9 Score 0          Fall Risk    12/09/2023    8:23 AM 02/12/2023    9:48 AM 12/03/2022    8:35 AM 11/08/2022    9:19 AM 08/13/2022   10:41 AM  Fall Risk   Falls in the past year? 0 0 0 0 0  Number falls in past yr: 0 0 0 0 0  Injury with Fall? 0 0 0 0 0  Risk for fall due to : No Fall Risks No Fall Risks No Fall Risks No Fall  Risks No Fall Risks  Follow up Falls prevention discussed;Falls evaluation completed Falls evaluation completed Falls evaluation completed;Falls prevention discussed Falls evaluation completed Falls evaluation completed    MEDICARE RISK AT HOME: Medicare Risk at  Home Any stairs in or around the home?: Yes If so, are there any without handrails?: No Home free of loose throw rugs in walkways, pet beds, electrical cords, etc?: Yes Adequate lighting in your home to reduce risk of falls?: Yes Life alert?: No Use of a cane, walker or w/c?: No Grab bars in the bathroom?: Yes Shower chair or bench in shower?: No      Cognitive Function:    11/06/2017   11:28 AM  MMSE - Mini Mental State Exam  Orientation to time 5  Orientation to Place 5  Registration 3  Attention/ Calculation 5  Recall 3  Language- name 2 objects 2  Language- repeat 1  Language- follow 3 step command 3  Language- read & follow direction 1  Write a sentence 1  Copy design 1  Total score 30        12/09/2023    8:37 AM 12/03/2022    8:53 AM 11/29/2021   11:02 AM 11/28/2020   10:44 AM 11/24/2019   10:54 AM  6CIT Screen  What Year? 0 points 0 points 0 points 0 points 0 points  What month? 0 points 0 points 0 points 0 points 0 points  What time? 0 points 0 points 0 points 0 points 0 points  Count back from 20 0 points 0 points 0 points 0 points 0 points  Months in reverse 0 points 0 points 0 points 0 points 0 points  Repeat phrase 2 points 0 points 0 points 0 points 0 points  Total Score 2 points 0 points 0 points 0 points 0 points    Immunizations Immunization History  Administered Date(s) Administered   PFIZER(Purple Top)SARS-COV-2 Vaccination 01/25/2020, 02/15/2020, 12/10/2020   Pneumococcal Conjugate-13 11/25/2017   Pneumococcal Polysaccharide-23 12/10/2018    TDAP status: Due, Education has been provided regarding the importance of this vaccine. Advised may receive this vaccine at local pharmacy or Health Dept. Aware to provide a copy of the vaccination record if obtained from local pharmacy or Health Dept. Verbalized acceptance and understanding.  Flu Vaccine status: Declined, Education has been provided regarding the importance of this vaccine but patient  still declined. Advised may receive this vaccine at local pharmacy or Health Dept. Aware to provide a copy of the vaccination record if obtained from local pharmacy or Health Dept. Verbalized acceptance and understanding.  Pneumococcal vaccine status: Up to date  Covid-19 vaccine status: Information provided on how to obtain vaccines.   Qualifies for Shingles Vaccine? Yes   Zostavax completed No   Shingrix Completed?: No.    Education has been provided regarding the importance of this vaccine. Patient has been advised to call insurance company to determine out of pocket expense if they have not yet received this vaccine. Advised may also receive vaccine at local pharmacy or Health Dept. Verbalized acceptance and understanding.  Screening Tests Health Maintenance  Topic Date Due   Hepatitis C Screening  Never done   Colonoscopy  09/24/2019   COVID-19 Vaccine (4 - 2023-24 season) 08/31/2023   Medicare Annual Wellness (AWV)  12/04/2023   Zoster Vaccines- Shingrix (1 of 2) 01/14/2024 (Originally 12/21/1965)   INFLUENZA VACCINE  03/29/2024 (Originally 07/31/2023)   MAMMOGRAM  09/07/2024   Pneumonia Vaccine 64+ Years old  Completed  DEXA SCAN  Completed   HPV VACCINES  Aged Out   DTaP/Tdap/Td  Discontinued    Health Maintenance  Health Maintenance Due  Topic Date Due   Hepatitis C Screening  Never done   Colonoscopy  09/24/2019   COVID-19 Vaccine (4 - 2023-24 season) 08/31/2023   Medicare Annual Wellness (AWV)  12/04/2023    Colorectal cancer screening: No longer required.   Mammogram status: Completed 08/2023. Repeat every year  Bone Density status: Completed 06/2017. Results reflect: Bone density results: OSTEOPENIA. Repeat every 2 years. Patient wants to discuss with her PCP at next visit  Lung Cancer Screening: (Low Dose CT Chest recommended if Age 67-80 years, 20 pack-year currently smoking OR have quit w/in 15years.) does not qualify.     Additional Screening:  Hepatitis  C Screening: does qualify; Completed Patient has declined  Vision Screening: Recommended annual ophthalmology exams for early detection of glaucoma and other disorders of the eye. Is the patient up to date with their annual eye exam?  Yes  Who is the provider or what is the name of the office in which the patient attends annual eye exams? [facility] If pt is not established with a provider, would they like to be referred to a provider to establish care? No .   Dental Screening: Recommended annual dental exams for proper oral hygiene   Community Resource Referral / Chronic Care Management: CRR required this visit?  No   CCM required this visit?  No     Plan:     I have personally reviewed and noted the following in the patient's chart:   Medical and social history Use of alcohol, tobacco or illicit drugs  Current medications and supplements including opioid prescriptions. Patient is not currently taking opioid prescriptions. Functional ability and status Nutritional status Physical activity Advanced directives List of other physicians Hospitalizations, surgeries, and ER visits in previous 12 months Vitals Screenings to include cognitive, depression, and falls Referrals and appointments  In addition, I have reviewed and discussed with patient certain preventive protocols, quality metrics, and best practice recommendations. A written personalized care plan for preventive services as well as general preventive health recommendations were provided to patient.     Sydell Axon, LPN   16/09/[id number]   After Visit Summary: (Pick Up) Due to this being a telephonic visit, with patients personalized plan was offered to patient and patient has requested to Pick up at office.  Nurse Notes: None

## 2023-12-09 NOTE — Patient Instructions (Signed)
Erica Little , Thank you for taking time to come for your Medicare Wellness Visit. I appreciate your ongoing commitment to your health goals. Please review the following plan we discussed and let me know if I can assist you in the future.   Referrals/Orders/Follow-Ups/Clinician Recommendations: Consider updating your vaccines  This is a list of the screening recommended for you and due dates:  Health Maintenance  Topic Date Due   Hepatitis C Screening  Never done   Colon Cancer Screening  09/24/2019   COVID-19 Vaccine (4 - 2023-24 season) 08/31/2023   Zoster (Shingles) Vaccine (1 of 2) 01/14/2024*   Flu Shot  03/29/2024*   Mammogram  09/07/2024   Medicare Annual Wellness Visit  12/08/2024   Pneumonia Vaccine  Completed   DEXA scan (bone density measurement)  Completed   HPV Vaccine  Aged Out   DTaP/Tdap/Td vaccine  Discontinued  *Topic was postponed. The date shown is not the original due date.    Advanced directives: (Copy Requested) Please bring a copy of your health care power of attorney and living will to the office to be added to your chart at your convenience.  Next Medicare Annual Wellness Visit scheduled for next year: Yes 12/10/24 @ 8:10

## 2024-02-10 ENCOUNTER — Other Ambulatory Visit: Payer: Self-pay | Admitting: Internal Medicine

## 2024-02-12 ENCOUNTER — Other Ambulatory Visit: Payer: Medicare HMO

## 2024-02-16 ENCOUNTER — Ambulatory Visit: Payer: Medicare HMO | Admitting: Internal Medicine

## 2024-03-17 ENCOUNTER — Other Ambulatory Visit (INDEPENDENT_AMBULATORY_CARE_PROVIDER_SITE_OTHER): Payer: Medicare HMO

## 2024-03-17 DIAGNOSIS — E7800 Pure hypercholesterolemia, unspecified: Secondary | ICD-10-CM | POA: Diagnosis not present

## 2024-03-17 DIAGNOSIS — R739 Hyperglycemia, unspecified: Secondary | ICD-10-CM | POA: Diagnosis not present

## 2024-03-17 LAB — CBC WITH DIFFERENTIAL/PLATELET
Basophils Absolute: 0.1 10*3/uL (ref 0.0–0.1)
Basophils Relative: 1.1 % (ref 0.0–3.0)
Eosinophils Absolute: 0.4 10*3/uL (ref 0.0–0.7)
Eosinophils Relative: 7.3 % — ABNORMAL HIGH (ref 0.0–5.0)
HCT: 36.4 % (ref 36.0–46.0)
Hemoglobin: 12.2 g/dL (ref 12.0–15.0)
Lymphocytes Relative: 24.2 % (ref 12.0–46.0)
Lymphs Abs: 1.3 10*3/uL (ref 0.7–4.0)
MCHC: 33.4 g/dL (ref 30.0–36.0)
MCV: 89.7 fl (ref 78.0–100.0)
Monocytes Absolute: 0.5 10*3/uL (ref 0.1–1.0)
Monocytes Relative: 9.5 % (ref 3.0–12.0)
Neutro Abs: 3 10*3/uL (ref 1.4–7.7)
Neutrophils Relative %: 57.9 % (ref 43.0–77.0)
Platelets: 197 10*3/uL (ref 150.0–400.0)
RBC: 4.06 Mil/uL (ref 3.87–5.11)
RDW: 14.6 % (ref 11.5–15.5)
WBC: 5.2 10*3/uL (ref 4.0–10.5)

## 2024-03-17 LAB — BASIC METABOLIC PANEL
BUN: 13 mg/dL (ref 6–23)
CO2: 26 meq/L (ref 19–32)
Calcium: 9 mg/dL (ref 8.4–10.5)
Chloride: 99 meq/L (ref 96–112)
Creatinine, Ser: 0.88 mg/dL (ref 0.40–1.20)
GFR: 62.59 mL/min (ref >60.00)
Glucose, Bld: 86 mg/dL (ref 70–99)
Potassium: 4.5 meq/L (ref 3.5–5.1)
Sodium: 132 meq/L — ABNORMAL LOW (ref 135–145)

## 2024-03-17 LAB — LIPID PANEL
Cholesterol: 188 mg/dL (ref 0–200)
HDL: 57.5 mg/dL (ref >39.00)
LDL Cholesterol: 114 mg/dL — ABNORMAL HIGH (ref 0–99)
NonHDL: 130.42
Total CHOL/HDL Ratio: 3
Triglycerides: 83 mg/dL (ref 0.0–149.0)
VLDL: 16.6 mg/dL (ref 0.0–40.0)

## 2024-03-17 LAB — HEPATIC FUNCTION PANEL
ALT: 11 U/L (ref 0–35)
AST: 18 U/L (ref 0–37)
Albumin: 4.1 g/dL (ref 3.5–5.2)
Alkaline Phosphatase: 86 U/L (ref 39–117)
Bilirubin, Direct: 0.1 mg/dL (ref 0.0–0.3)
Total Bilirubin: 0.5 mg/dL (ref 0.2–1.2)
Total Protein: 6.4 g/dL (ref 6.0–8.3)

## 2024-03-17 LAB — HEMOGLOBIN A1C: Hgb A1c MFr Bld: 5.8 % (ref 4.6–6.5)

## 2024-03-22 ENCOUNTER — Ambulatory Visit (INDEPENDENT_AMBULATORY_CARE_PROVIDER_SITE_OTHER): Payer: Medicare HMO | Admitting: Internal Medicine

## 2024-03-22 VITALS — BP 128/70 | HR 66 | Temp 98.3°F | Resp 16 | Ht 59.0 in | Wt 95.4 lb

## 2024-03-22 DIAGNOSIS — E7800 Pure hypercholesterolemia, unspecified: Secondary | ICD-10-CM | POA: Diagnosis not present

## 2024-03-22 DIAGNOSIS — M069 Rheumatoid arthritis, unspecified: Secondary | ICD-10-CM

## 2024-03-22 DIAGNOSIS — I779 Disorder of arteries and arterioles, unspecified: Secondary | ICD-10-CM

## 2024-03-22 DIAGNOSIS — R918 Other nonspecific abnormal finding of lung field: Secondary | ICD-10-CM

## 2024-03-22 DIAGNOSIS — W5501XS Bitten by cat, sequela: Secondary | ICD-10-CM

## 2024-03-22 DIAGNOSIS — I10 Essential (primary) hypertension: Secondary | ICD-10-CM

## 2024-03-22 DIAGNOSIS — I700 Atherosclerosis of aorta: Secondary | ICD-10-CM

## 2024-03-22 DIAGNOSIS — Z9889 Other specified postprocedural states: Secondary | ICD-10-CM

## 2024-03-22 DIAGNOSIS — Z8679 Personal history of other diseases of the circulatory system: Secondary | ICD-10-CM

## 2024-03-22 DIAGNOSIS — J4489 Other specified chronic obstructive pulmonary disease: Secondary | ICD-10-CM

## 2024-03-22 DIAGNOSIS — R739 Hyperglycemia, unspecified: Secondary | ICD-10-CM

## 2024-03-22 DIAGNOSIS — J439 Emphysema, unspecified: Secondary | ICD-10-CM

## 2024-03-22 DIAGNOSIS — E871 Hypo-osmolality and hyponatremia: Secondary | ICD-10-CM

## 2024-03-22 DIAGNOSIS — R634 Abnormal weight loss: Secondary | ICD-10-CM

## 2024-03-22 NOTE — Assessment & Plan Note (Signed)
 Intolerant to praluent. Lovastatin three days per week.  Tolerating.  Did not tolerate 4 days per week. Continue current dose.  Follow lipid panel and liver function tests.   Recent LDL improved - 114. No changes today.

## 2024-03-22 NOTE — Progress Notes (Unsigned)
 Subjective:    Patient ID: Erica Little, female    DOB: 04/27/1946, 78 y.o.   MRN: [id number]  Patient here for  Chief Complaint  Patient presents with  . Medical Management of Chronic Issues    HPI Here for a scheduled follow up - follow up regarding hypercholesterolemia, hypertension, hyperglycemia and increased stress.    Past Medical History:  Diagnosis Date  . Family history of adverse reaction to anesthesia    Mother - PONV  . Family history of brain aneurysm   . GERD (gastroesophageal reflux disease)   . Hyperlipidemia   . Hypertension   . MRSA (methicillin resistant Staphylococcus aureus)    skin infections  . Rheumatoid arthritis(714.0)    transaminitis on MTX, remicade rxn, s/p sulfasalazine, orencia  . Ulcer disease    Past Surgical History:  Procedure Laterality Date  . CATARACT EXTRACTION W/PHACO Left 01/22/2021   Procedure: CATARACT EXTRACTION PHACO AND INTRAOCULAR LENS PLACEMENT (IOC) LEFT;  Surgeon: Nevada Crane, MD;  Location: [facility];  Service: Ophthalmology;  Laterality: Left;  5.90 0:37.7  . CATARACT EXTRACTION W/PHACO Right 02/05/2021   Procedure: CATARACT EXTRACTION PHACO AND INTRAOCULAR LENS PLACEMENT (IOC) RIGHT 4.33 00:34.6;  Surgeon: Nevada Crane, MD;  Location: [facility];  Service: Ophthalmology;  Laterality: Right;  . CHOLECYSTECTOMY  2010  . INTRACRANIAL ANEURYSM REPAIR  05/29/2016  . MCP replacements  2001   left  . TUBAL LIGATION     Family History  Problem Relation Age of Onset  . Heart disease Mother   . Diabetes Mother   . Hypercholesterolemia Mother   . Heart disease Father   . Heart disease Brother        MI age 47  . Hypercholesterolemia Sister   . Cancer Sister   . Colon polyps Sister   . Arthritis/Rheumatoid Paternal Aunt   . Arthritis/Rheumatoid Paternal Uncle   . Breast cancer Neg Hx    Social History   Socioeconomic History  . Marital status: Widowed    Spouse name: Not on file   . Number of children: 1  . Years of education: Not on file  . Highest education level: Not on file  Occupational History  . Not on file  Tobacco Use  . Smoking status: Former    Current packs/day: 0.00    Types: Cigarettes    Quit date: 12/30/1988    Years since quitting: 35.2  . Smokeless tobacco: Never  Vaping Use  . Vaping status: Never Used  Substance and Sexual Activity  . Alcohol use: No    Alcohol/week: 0.0 standard drinks of alcohol  . Drug use: No  . Sexual activity: Yes  Other Topics Concern  . Not on file  Social History Narrative   Enjoys dancing    Social Drivers of Health   Financial Resource Strain: Low Risk  (12/09/2023)   Overall Financial Resource Strain (CARDIA)   . Difficulty of Paying Living Expenses: Not hard at all  Food Insecurity: No Food Insecurity (12/09/2023)   Hunger Vital Sign   . Worried About Programme researcher, broadcasting/film/video in the Last Year: Never true   . Ran Out of Food in the Last Year: Never true  Transportation Needs: No Transportation Needs (12/09/2023)   PRAPARE - Transportation   . Lack of Transportation (Medical): No   . Lack of Transportation (Non-Medical): No  Physical Activity: Sufficiently Active (12/09/2023)   Exercise Vital Sign   . Days of Exercise per Week:  7 days   . Minutes of Exercise per Session: 50 min  Stress: No Stress Concern Present (12/09/2023)   [employer] of Occupational Health - Occupational Stress Questionnaire   . Feeling of Stress : Not at all  Social Connections: Moderately Integrated (12/09/2023)   Social Connection and Isolation Panel [NHANES]   . Frequency of Communication with Friends and Family: More than three times a week   . Frequency of Social Gatherings with Friends and Family: More than three times a week   . Attends Religious Services: More than 4 times per year   . Active Member of Clubs or Organizations: Yes   . Attends Banker Meetings: More than 4 times per year   . Marital  Status: Widowed     Review of Systems     Objective:     BP 128/70   Pulse 66   Temp 98.3 F (36.8 C)   Resp 16   Ht 4\' 11"  (1.499 m)   Wt 95 lb 6.4 oz (43.3 kg)   SpO2 99%   BMI 19.27 kg/m  Wt Readings from Last 3 Encounters:  03/22/24 95 lb 6.4 oz (43.3 kg)  12/09/23 96 lb (43.5 kg)  10/14/23 94 lb 9.6 oz (42.9 kg)    Physical Exam  {Perform Simple Foot Exam  Perform Detailed exam:1} {Insert foot Exam (Optional):30965}   Outpatient Encounter Medications as of 03/22/2024  Medication Sig  . abatacept (ORENCIA) 250 MG injection Once every month  . amLODipine (NORVASC) 2.5 MG tablet TAKE 1 TABLET BY MOUTH EVERY DAY  . aspirin 81 MG chewable tablet Chew 81 mg by mouth daily.  . calcium gluconate 650 MG tablet Take 650 mg by mouth daily.  . carbamide peroxide (DEBROX) 6.5 % OTIC solution 5 drops in right ear q day.  Massage for approximately 5 minutes.  . cholecalciferol (VITAMIN D) 1000 UNITS tablet Take 1,000 Units by mouth daily.  . cyanocobalamin (VITAMIN B12) 1000 MCG/ML injection Inject 1 mL into the muscle once every 30 days.  Marland Kitchen ELDERBERRY PO Take by mouth daily.  . folic acid (FOLVITE) 1 MG tablet Take 1 mg by mouth daily. (Except MTX days)  . lovastatin (MEVACOR) 10 MG tablet Take 1 tablet (10 mg total) by mouth 3 (three) times a week.  . magnesium oxide (MAG-OX) 400 MG tablet Take 400 mg by mouth daily.  . methotrexate 50 MG/2ML injection INJECT 0.2MLS SUBCUTANEOUSLY WEEKLY DISPENSE 2 VIALS  . NEEDLE, DISP, 25 G (B-D DISP NEEDLE 25GX1") 25G X 1" MISC Use as directed to administer b12 injections  . pantoprazole (PROTONIX) 40 MG tablet TAKE 1 TABLET BY MOUTH EVERY DAY  . SYRINGE-NEEDLE, DISP, 3 ML 25G X 1" 3 ML MISC Use as directed for b12 injections  . telmisartan (MICARDIS) 40 MG tablet TAKE 1 TABLET BY MOUTH EVERY DAY  . Triamcinolone Acetonide (NASACORT AQ NA) Place into the nose as needed.  . valACYclovir (VALTREX) 1000 MG tablet TAKE 1 TABLET BY MOUTH EVERY  DAY   No facility-administered encounter medications on file as of 03/22/2024.     Lab Results  Component Value Date   WBC 5.2 03/17/2024   HGB 12.2 03/17/2024   HCT 36.4 03/17/2024   PLT 197.0 03/17/2024   GLUCOSE 86 03/17/2024   CHOL 188 03/17/2024   TRIG 83.0 03/17/2024   HDL 57.50 03/17/2024   LDLDIRECT 167.8 12/20/2013   LDLCALC 114 (H) 03/17/2024   ALT 11 03/17/2024   AST 18 03/17/2024  NA 132 (L) 03/17/2024   K 4.5 03/17/2024   CL 99 03/17/2024   CREATININE 0.88 03/17/2024   BUN 13 03/17/2024   CO2 26 03/17/2024   TSH 4.19 10/10/2023   INR 1.1 (H) 12/06/2013   HGBA1C 5.8 03/17/2024    MM 3D SCREENING MAMMOGRAM BILATERAL BREAST Result Date: 09/09/2023 CLINICAL DATA:  Screening. EXAM: DIGITAL SCREENING BILATERAL MAMMOGRAM WITH TOMOSYNTHESIS AND CAD TECHNIQUE: Bilateral screening digital craniocaudal and mediolateral oblique mammograms were obtained. Bilateral screening digital breast tomosynthesis was performed. The images were evaluated with computer-aided detection. COMPARISON:  Previous exam(s). ACR Breast Density Category b: There are scattered areas of fibroglandular density. FINDINGS: There are no findings suspicious for malignancy. IMPRESSION: No mammographic evidence of malignancy. A result letter of this screening mammogram will be mailed directly to the patient. RECOMMENDATION: Screening mammogram in one year. (Code:SM-B-01Y) BI-RADS CATEGORY  1: Negative. Electronically Signed   By: Frederico Hamman M.D.   On: 09/09/2023 11:12       Assessment & Plan:  Hypercholesteremia Assessment & Plan: Intolerant to praluent. Lovastatin three days per week.  Tolerating.  Did not tolerate 4 days per week. Continue current dose.  Follow lipid panel and liver function tests.   Recent LDL improved - 114.    Hyperglycemia     Dale Buford, MD

## 2024-03-26 ENCOUNTER — Encounter: Payer: Self-pay | Admitting: Internal Medicine

## 2024-03-26 DIAGNOSIS — W5501XA Bitten by cat, initial encounter: Secondary | ICD-10-CM | POA: Insufficient documentation

## 2024-03-26 NOTE — Assessment & Plan Note (Signed)
Has seen Dr Jayme Cloud.  Breathing stable.

## 2024-03-26 NOTE — Assessment & Plan Note (Signed)
Pulmonary f/u 06/2023 - f/u scan.  Saw Dr Aundria Rud - 08/2023 - CT stable.  Recommended f/u CT in 2 years.

## 2024-03-26 NOTE — Assessment & Plan Note (Signed)
 Continue micardis and amlodipine. Continue to spot check pressures.  Follow metabolic panel. Blood pressure as outlined. No changes.

## 2024-03-26 NOTE — Assessment & Plan Note (Signed)
 Carotid ultrasound 10/2021- Dr Gilda Crease - recommended f/u carotid ultrasound in 2 years. Continue statin therapy. Appears to be due f/u.  Need to confirm scheduled.

## 2024-03-26 NOTE — Assessment & Plan Note (Signed)
 S/p stenting.  Being followed at [facility]. On aspirin.   Last evaluated 05/2022.  MRA  06/12/22 - ok.  Recommended continuing aspirin.  Recommended f/u in 3 years.

## 2024-03-26 NOTE — Assessment & Plan Note (Addendum)
 On MTX and orencia.  Seeing rheumatology. Overall stable. Follow.

## 2024-03-26 NOTE — Assessment & Plan Note (Signed)
 Weight stable from last check.  Follow. Reports she is eating. Discussed diet.

## 2024-03-26 NOTE — Assessment & Plan Note (Signed)
 Has seen nephrology. Discussed today. Discussed her diet. Follow. Plan for recheck sodium to confirm stable.

## 2024-03-26 NOTE — Assessment & Plan Note (Signed)
 Her cat - right hand. Healing. Lesions scabbed over. No evidence of infection. Discussed abx. Declines. Does not feel needs. Has had vaccinations.

## 2024-03-26 NOTE — Assessment & Plan Note (Signed)
 Taking statin two days per week. Did not tolerate increased doses.  Reviewed labs.  Did not tolerate praluent.  Continue current medication regimen.  No changes. Follow.  Lab Results  Component Value Date   CHOL 188 03/17/2024   HDL 57.50 03/17/2024   LDLCALC 114 (H) 03/17/2024   LDLDIRECT 167.8 12/20/2013   TRIG 83.0 03/17/2024   CHOLHDL 3 03/17/2024

## 2024-03-26 NOTE — Assessment & Plan Note (Signed)
 Follow met b and A1c.  Lab Results  Component Value Date   HGBA1C 5.8 03/17/2024

## 2024-04-14 ENCOUNTER — Telehealth: Payer: Self-pay | Admitting: Internal Medicine

## 2024-04-14 NOTE — Telephone Encounter (Signed)
 I called [facility] and left a message. Pt had carotid ultrasound 10/2021. Dr Prescilla Brod recommended f/u carotid ultrasound in 2 years. Left message with [facility] - need to schedule. Pt notified as well and was informed to call me back and let me know if any problems.

## 2024-04-15 ENCOUNTER — Telehealth (INDEPENDENT_AMBULATORY_CARE_PROVIDER_SITE_OTHER): Payer: Self-pay | Admitting: Vascular Surgery

## 2024-04-15 NOTE — Telephone Encounter (Signed)
 LVM for patient to call to make 2 year fu appointment with Dr. Prescilla Brod per call from Dr. Dellar Fenton. fu 2 years + Carotid see GS/FB

## 2024-04-19 ENCOUNTER — Other Ambulatory Visit (INDEPENDENT_AMBULATORY_CARE_PROVIDER_SITE_OTHER)

## 2024-04-19 DIAGNOSIS — E871 Hypo-osmolality and hyponatremia: Secondary | ICD-10-CM | POA: Diagnosis not present

## 2024-04-19 DIAGNOSIS — R739 Hyperglycemia, unspecified: Secondary | ICD-10-CM

## 2024-04-19 DIAGNOSIS — E7800 Pure hypercholesterolemia, unspecified: Secondary | ICD-10-CM

## 2024-04-19 LAB — SODIUM: Sodium: 131 meq/L — ABNORMAL LOW (ref 135–145)

## 2024-04-19 NOTE — Addendum Note (Signed)
 Addended by: Thressa Flora D on: 04/19/2024 09:38 AM   Modules accepted: Orders

## 2024-05-18 ENCOUNTER — Other Ambulatory Visit (INDEPENDENT_AMBULATORY_CARE_PROVIDER_SITE_OTHER)

## 2024-05-18 DIAGNOSIS — R739 Hyperglycemia, unspecified: Secondary | ICD-10-CM

## 2024-05-18 DIAGNOSIS — E871 Hypo-osmolality and hyponatremia: Secondary | ICD-10-CM | POA: Diagnosis not present

## 2024-05-18 DIAGNOSIS — E7800 Pure hypercholesterolemia, unspecified: Secondary | ICD-10-CM

## 2024-05-19 LAB — BASIC METABOLIC PANEL WITH GFR
BUN: 10 mg/dL (ref 7–25)
CO2: 24 mmol/L (ref 20–32)
Calcium: 9.1 mg/dL (ref 8.6–10.4)
Chloride: 98 mmol/L (ref 98–110)
Creat: 0.81 mg/dL (ref 0.60–1.00)
Glucose, Bld: 64 mg/dL — ABNORMAL LOW (ref 65–99)
Potassium: 4.5 mmol/L (ref 3.5–5.3)
Sodium: 133 mmol/L — ABNORMAL LOW (ref 135–146)
eGFR: 74 mL/min/{1.73_m2} (ref >=60)

## 2024-05-20 ENCOUNTER — Ambulatory Visit: Payer: Self-pay | Admitting: Internal Medicine

## 2024-05-20 ENCOUNTER — Encounter (INDEPENDENT_AMBULATORY_CARE_PROVIDER_SITE_OTHER)

## 2024-05-20 ENCOUNTER — Ambulatory Visit (INDEPENDENT_AMBULATORY_CARE_PROVIDER_SITE_OTHER): Admitting: Vascular Surgery

## 2024-05-20 NOTE — Telephone Encounter (Signed)
 Copied from CRM [id number]. Topic: Clinical - Lab/Test Results >> May 20, 2024 10:32 AM Elita Guitar wrote: Reason for CRM: pt called back and I relayed the lab results.

## 2024-06-17 ENCOUNTER — Encounter: Payer: Self-pay | Admitting: Internal Medicine

## 2024-06-17 DIAGNOSIS — M79672 Pain in left foot: Secondary | ICD-10-CM | POA: Insufficient documentation

## 2024-06-21 ENCOUNTER — Other Ambulatory Visit (INDEPENDENT_AMBULATORY_CARE_PROVIDER_SITE_OTHER): Payer: Self-pay | Admitting: Vascular Surgery

## 2024-06-21 DIAGNOSIS — I6523 Occlusion and stenosis of bilateral carotid arteries: Secondary | ICD-10-CM

## 2024-06-22 ENCOUNTER — Encounter (INDEPENDENT_AMBULATORY_CARE_PROVIDER_SITE_OTHER): Payer: Self-pay | Admitting: Nurse Practitioner

## 2024-06-22 ENCOUNTER — Ambulatory Visit (INDEPENDENT_AMBULATORY_CARE_PROVIDER_SITE_OTHER): Admitting: Nurse Practitioner

## 2024-06-22 ENCOUNTER — Ambulatory Visit (INDEPENDENT_AMBULATORY_CARE_PROVIDER_SITE_OTHER)

## 2024-06-22 VITALS — BP 142/64 | HR 67 | Ht 59.0 in | Wt 95.8 lb

## 2024-06-22 DIAGNOSIS — E7800 Pure hypercholesterolemia, unspecified: Secondary | ICD-10-CM | POA: Diagnosis not present

## 2024-06-22 DIAGNOSIS — I10 Essential (primary) hypertension: Secondary | ICD-10-CM

## 2024-06-22 DIAGNOSIS — I6523 Occlusion and stenosis of bilateral carotid arteries: Secondary | ICD-10-CM

## 2024-06-22 NOTE — Progress Notes (Signed)
 Subjective:    Patient ID: Erica Little, female    DOB: 12/05/1946, 78 y.o.   MRN: [id number] Chief Complaint  Patient presents with   Follow-up    fu 2 years + Carotid see GS/FB     The patient is seen for follow up evaluation of carotid stenosis. The carotid stenosis followed by ultrasound.   The patient denies amaurosis fugax. There is no recent history of TIA symptoms or focal motor deficits. There is no prior documented CVA.  The patient is taking enteric-coated aspirin 81 mg daily.  There is no history of migraine headaches. There is no history of seizures.  No recent shortening of the patient's walking distance or new symptoms consistent with claudication.  No history of rest pain symptoms. No new ulcers or wounds of the lower extremities have occurred.  There is no history of DVT, PE or superficial thrombophlebitis. No documented recent episodes of angina or shortness of breath documented.   Carotid Duplex done today shows 139% stenosis bilaterally.  No significant change from previous study last year.    Review of Systems  Musculoskeletal:  Positive for arthralgias.  All other systems reviewed and are negative.      Objective:   Physical Exam Vitals reviewed.  HENT:     Head: Normocephalic.  Neck:     Vascular: No carotid bruit.   Cardiovascular:     Rate and Rhythm: Normal rate.     Pulses: Normal pulses.  Pulmonary:     Effort: Pulmonary effort is normal.   Skin:    General: Skin is warm and dry.   Neurological:     Mental Status: She is alert and oriented to person, place, and time.   Psychiatric:        Mood and Affect: Mood normal.        Behavior: Behavior normal.        Thought Content: Thought content normal.        Judgment: Judgment normal.     BP (!) 142/64   Pulse 67   Ht 4' 11 (1.499 m)   Wt 95 lb 12.8 oz (43.5 kg)   BMI 19.35 kg/m   Past Medical History:  Diagnosis Date   Family history of adverse reaction to  anesthesia    Mother - PONV   Family history of brain aneurysm    GERD (gastroesophageal reflux disease)    Hyperlipidemia    Hypertension    MRSA (methicillin resistant Staphylococcus aureus)    skin infections   Rheumatoid arthritis(714.0)    transaminitis on MTX, remicade rxn, s/p sulfasalazine, orencia    Ulcer disease     Social History   Socioeconomic History   Marital status: Widowed    Spouse name: Not on file   Number of children: 1   Years of education: Not on file   Highest education level: Not on file  Occupational History   Not on file  Tobacco Use   Smoking status: Former    Current packs/day: 0.00    Types: Cigarettes    Quit date: 12/30/1988    Years since quitting: 35.5   Smokeless tobacco: Never  Vaping Use   Vaping status: Never Used  Substance and Sexual Activity   Alcohol use: No    Alcohol/week: 0.0 standard drinks of alcohol   Drug use: No   Sexual activity: Yes  Other Topics Concern   Not on file  Social History Narrative   Enjoys dancing  Social Drivers of Corporate investment banker Strain: Low Risk  (12/09/2023)   Overall Financial Resource Strain (CARDIA)    Difficulty of Paying Living Expenses: Not hard at all  Food Insecurity: No Food Insecurity (12/09/2023)   Hunger Vital Sign    Worried About Running Out of Food in the Last Year: Never true    Ran Out of Food in the Last Year: Never true  Transportation Needs: No Transportation Needs (12/09/2023)   PRAPARE - Administrator, Civil Service (Medical): No    Lack of Transportation (Non-Medical): No  Physical Activity: Sufficiently Active (12/09/2023)   Exercise Vital Sign    Days of Exercise per Week: 7 days    Minutes of Exercise per Session: 50 min  Stress: No Stress Concern Present (12/09/2023)   [employer] of Occupational Health - Occupational Stress Questionnaire    Feeling of Stress : Not at all  Social Connections: Moderately Integrated (12/09/2023)    Social Connection and Isolation Panel    Frequency of Communication with Friends and Family: More than three times a week    Frequency of Social Gatherings with Friends and Family: More than three times a week    Attends Religious Services: More than 4 times per year    Active Member of [employer] or Organizations: Yes    Attends Banker Meetings: More than 4 times per year    Marital Status: Widowed  Intimate Partner Violence: Not At Risk (12/09/2023)   Humiliation, Afraid, Rape, and Kick questionnaire    Fear of Current or Ex-Partner: No    Emotionally Abused: No    Physically Abused: No    Sexually Abused: No    Past Surgical History:  Procedure Laterality Date   CATARACT EXTRACTION W/PHACO Left 01/22/2021   Procedure: CATARACT EXTRACTION PHACO AND INTRAOCULAR LENS PLACEMENT (IOC) LEFT;  Surgeon: Myrna Adine Anes, MD;  Location: [facility];  Service: Ophthalmology;  Laterality: Left;  5.90 0:37.7   CATARACT EXTRACTION W/PHACO Right 02/05/2021   Procedure: CATARACT EXTRACTION PHACO AND INTRAOCULAR LENS PLACEMENT (IOC) RIGHT 4.33 00:34.6;  Surgeon: Myrna Adine Anes, MD;  Location: [facility];  Service: Ophthalmology;  Laterality: Right;   CHOLECYSTECTOMY  2010   INTRACRANIAL ANEURYSM REPAIR  05/29/2016   MCP replacements  2001   left   TUBAL LIGATION      Family History  Problem Relation Age of Onset   Heart disease Mother    Diabetes Mother    Hypercholesterolemia Mother    Heart disease Father    Heart disease Brother        MI age 23   Hypercholesterolemia Sister    Cancer Sister    Colon polyps Sister    Arthritis/Rheumatoid Paternal Aunt    Arthritis/Rheumatoid Paternal Uncle    Breast cancer Neg Hx     Allergies  Allergen Reactions   Oxycodone Nausea Only   Remicade [Infliximab] Other (See Comments)    Lightheadedness   Simvastatin     Joint pain   Sulfa Antibiotics     Stomach ulcers   Zetia [Ezetimibe] Other (See Comments)     Muscle aches    Latex Rash    Tape tears skin   Tape Rash    tape tears skin        Latest Ref Rng & Units 03/17/2024    9:21 AM 02/10/2023    8:16 AM 11/26/2021   10:38 AM  CBC  WBC 4.0 - 10.5  K/uL 5.2  5.5  5.9   Hemoglobin 12.0 - 15.0 g/dL 87.7  86.6  87.3   Hematocrit 36.0 - 46.0 % 36.4  39.8  38.9   Platelets 150.0 - 400.0 K/uL 197.0  200.0  206.0       CMP     Component Value Date/Time   NA 133 (L) 05/18/2024 0923   K 4.5 05/18/2024 0923   CL 98 05/18/2024 0923   CO2 24 05/18/2024 0923   GLUCOSE 64 (L) 05/18/2024 0923   BUN 10 05/18/2024 0923   CREATININE 0.81 05/18/2024 0923   CALCIUM  9.1 05/18/2024 0923   PROT 6.4 03/17/2024 0921   ALBUMIN 4.1 03/17/2024 0921   AST 18 03/17/2024 0921   ALT 11 03/17/2024 0921   ALKPHOS 86 03/17/2024 0921   BILITOT 0.5 03/17/2024 0921   GFR 62.59 03/17/2024 0921   EGFR 74 05/18/2024 0923   GFRNONAA 51 (L) 11/18/2015 2250     No results found.     Assessment & Plan:   1. Bilateral carotid artery stenosis (Primary) Recommend:  Given the patient's asymptomatic subcritical stenosis no further invasive testing or surgery at this time.  Duplex ultrasound shows <40% stenosis bilaterally.  Continue antiplatelet therapy as prescribed Continue management of CAD, HTN and Hyperlipidemia Healthy heart diet,  encouraged exercise at least 4 times per week  Follow up in 24 months with duplex ultrasound and physical exam   2. Primary hypertension Continue antihypertensive medications as already ordered, these medications have been reviewed and there are no changes at this time.  3. Hypercholesteremia Continue statin as ordered and reviewed, no changes at this time   Current Outpatient Medications on File Prior to Visit  Medication Sig Dispense Refill   abatacept  (ORENCIA ) 250 MG injection Once every month     amLODipine  (NORVASC ) 2.5 MG tablet TAKE 1 TABLET BY MOUTH EVERY DAY 90 tablet 1   aspirin 81 MG chewable  tablet Chew 81 mg by mouth daily.     calcium  gluconate 650 MG tablet Take 650 mg by mouth daily.     carbamide peroxide (DEBROX) 6.5 % OTIC solution 5 drops in right ear q day.  Massage for approximately 5 minutes. 15 mL 0   cholecalciferol (VITAMIN D ) 1000 UNITS tablet Take 1,000 Units by mouth daily.     cyanocobalamin  (VITAMIN B12) 1000 MCG/ML injection Inject 1 mL into the muscle once every 30 days. 3 mL 3   ELDERBERRY PO Take by mouth daily.     folic acid (FOLVITE) 1 MG tablet Take 1 mg by mouth daily. (Except MTX days)     lovastatin  (MEVACOR ) 10 MG tablet Take 1 tablet (10 mg total) by mouth 3 (three) times a week. 90 tablet 1   magnesium oxide (MAG-OX) 400 MG tablet Take 400 mg by mouth daily.     methotrexate 50 MG/2ML injection INJECT 0.2MLS SUBCUTANEOUSLY WEEKLY DISPENSE 2 VIALS  1   NEEDLE, DISP, 25 G (B-D DISP NEEDLE 25GX1) 25G X 1 MISC Use as directed to administer b12 injections 50 each 0   pantoprazole  (PROTONIX ) 40 MG tablet TAKE 1 TABLET BY MOUTH EVERY DAY 90 tablet 3   SYRINGE-NEEDLE, DISP, 3 ML 25G X 1 3 ML MISC Use as directed for b12 injections 50 each 0   telmisartan  (MICARDIS ) 40 MG tablet TAKE 1 TABLET BY MOUTH EVERY DAY 90 tablet 1   Triamcinolone Acetonide (NASACORT AQ NA) Place into the nose as needed.     valACYclovir  (VALTREX )  1000 MG tablet TAKE 1 TABLET BY MOUTH EVERY DAY 90 tablet 1   No current facility-administered medications on file prior to visit.    There are no Patient Instructions on file for this visit. No follow-ups on file.   Cortlyn Cannell E Jayveon Convey, NP

## 2024-07-21 ENCOUNTER — Other Ambulatory Visit: Payer: Self-pay | Admitting: Internal Medicine

## 2024-07-21 ENCOUNTER — Other Ambulatory Visit (INDEPENDENT_AMBULATORY_CARE_PROVIDER_SITE_OTHER)

## 2024-07-21 DIAGNOSIS — E7800 Pure hypercholesterolemia, unspecified: Secondary | ICD-10-CM | POA: Diagnosis not present

## 2024-07-21 DIAGNOSIS — R739 Hyperglycemia, unspecified: Secondary | ICD-10-CM | POA: Diagnosis not present

## 2024-07-21 DIAGNOSIS — E871 Hypo-osmolality and hyponatremia: Secondary | ICD-10-CM

## 2024-07-21 DIAGNOSIS — I10 Essential (primary) hypertension: Secondary | ICD-10-CM

## 2024-07-21 LAB — HEPATIC FUNCTION PANEL
ALT: 11 U/L (ref 0–35)
AST: 22 U/L (ref 0–37)
Albumin: 4.1 g/dL (ref 3.5–5.2)
Alkaline Phosphatase: 81 U/L (ref 39–117)
Bilirubin, Direct: 0.1 mg/dL (ref 0.0–0.3)
Total Bilirubin: 0.4 mg/dL (ref 0.2–1.2)
Total Protein: 6.4 g/dL (ref 6.0–8.3)

## 2024-07-21 LAB — HEMOGLOBIN A1C: Hgb A1c MFr Bld: 6 % (ref 4.6–6.5)

## 2024-07-21 LAB — LIPID PANEL
Cholesterol: 193 mg/dL (ref 0–200)
HDL: 61.9 mg/dL (ref >39.00)
LDL Cholesterol: 117 mg/dL — ABNORMAL HIGH (ref 0–99)
NonHDL: 131.29
Total CHOL/HDL Ratio: 3
Triglycerides: 73 mg/dL (ref 0.0–149.0)
VLDL: 14.6 mg/dL (ref 0.0–40.0)

## 2024-07-21 NOTE — Progress Notes (Signed)
Order placed for add on lab.  °

## 2024-07-22 ENCOUNTER — Ambulatory Visit: Admitting: Internal Medicine

## 2024-07-22 ENCOUNTER — Ambulatory Visit (INDEPENDENT_AMBULATORY_CARE_PROVIDER_SITE_OTHER)

## 2024-07-22 DIAGNOSIS — E871 Hypo-osmolality and hyponatremia: Secondary | ICD-10-CM | POA: Diagnosis not present

## 2024-07-22 DIAGNOSIS — I10 Essential (primary) hypertension: Secondary | ICD-10-CM

## 2024-07-22 LAB — BASIC METABOLIC PANEL WITH GFR
BUN: 14 mg/dL (ref 6–23)
CO2: 23 meq/L (ref 19–32)
Calcium: 8.8 mg/dL (ref 8.4–10.5)
Chloride: 96 meq/L (ref 96–112)
Creatinine, Ser: 1.04 mg/dL (ref 0.40–1.20)
GFR: 51.09 mL/min — ABNORMAL LOW (ref >60.00)
Glucose, Bld: 84 mg/dL (ref 70–99)
Potassium: 4.7 meq/L (ref 3.5–5.1)
Sodium: 130 meq/L — ABNORMAL LOW (ref 135–145)

## 2024-07-23 ENCOUNTER — Ambulatory Visit: Payer: Self-pay | Admitting: Internal Medicine

## 2024-07-26 ENCOUNTER — Ambulatory Visit: Admitting: Internal Medicine

## 2024-07-27 ENCOUNTER — Ambulatory Visit: Admitting: Internal Medicine

## 2024-07-27 VITALS — BP 128/70 | HR 86 | Temp 98.0°F | Resp 16 | Ht 59.0 in | Wt 94.4 lb

## 2024-07-27 DIAGNOSIS — M069 Rheumatoid arthritis, unspecified: Secondary | ICD-10-CM

## 2024-07-27 DIAGNOSIS — I700 Atherosclerosis of aorta: Secondary | ICD-10-CM

## 2024-07-27 DIAGNOSIS — F439 Reaction to severe stress, unspecified: Secondary | ICD-10-CM

## 2024-07-27 DIAGNOSIS — I10 Essential (primary) hypertension: Secondary | ICD-10-CM

## 2024-07-27 DIAGNOSIS — I779 Disorder of arteries and arterioles, unspecified: Secondary | ICD-10-CM

## 2024-07-27 DIAGNOSIS — E871 Hypo-osmolality and hyponatremia: Secondary | ICD-10-CM | POA: Diagnosis not present

## 2024-07-27 DIAGNOSIS — Z9889 Other specified postprocedural states: Secondary | ICD-10-CM

## 2024-07-27 DIAGNOSIS — R634 Abnormal weight loss: Secondary | ICD-10-CM

## 2024-07-27 DIAGNOSIS — E7800 Pure hypercholesterolemia, unspecified: Secondary | ICD-10-CM

## 2024-07-27 DIAGNOSIS — Z8679 Personal history of other diseases of the circulatory system: Secondary | ICD-10-CM

## 2024-07-27 DIAGNOSIS — R739 Hyperglycemia, unspecified: Secondary | ICD-10-CM

## 2024-07-27 DIAGNOSIS — J4489 Other specified chronic obstructive pulmonary disease: Secondary | ICD-10-CM | POA: Diagnosis not present

## 2024-07-27 DIAGNOSIS — J439 Emphysema, unspecified: Secondary | ICD-10-CM

## 2024-07-27 DIAGNOSIS — R918 Other nonspecific abnormal finding of lung field: Secondary | ICD-10-CM

## 2024-07-27 NOTE — Progress Notes (Signed)
 Subjective:    Patient ID: Erica Little, female    DOB: November 22, 1946, 78 y.o.   MRN: [id number]  Patient here for  Chief Complaint  Patient presents with   Medical Management of Chronic Issues    HPI Here for a scheduled follow up - follow up regarding hypercholesterolemia, hypertension, hyperglycemia and increased stress. Receiving orencia  and on methotrexate. Stable.  Followed by rheumatology - RA. Saw [facility] - /u carotid ultrasound. Duplex ultrasound <40% bilaterally. F/u in 24 months. Stays active. Dances. No chest pain. No sob reported. No abdominal pain or bowel change reported. Discussed importance of eating regular meals. Discussed low sodium. Discussed - decreasing alcohol intake.    Past Medical History:  Diagnosis Date   Family history of adverse reaction to anesthesia    Mother - PONV   Family history of brain aneurysm    GERD (gastroesophageal reflux disease)    Hyperlipidemia    Hypertension    MRSA (methicillin resistant Staphylococcus aureus)    skin infections   Rheumatoid arthritis(714.0)    transaminitis on MTX, remicade rxn, s/p sulfasalazine, orencia    Ulcer disease    Past Surgical History:  Procedure Laterality Date   CATARACT EXTRACTION W/PHACO Left 01/22/2021   Procedure: CATARACT EXTRACTION PHACO AND INTRAOCULAR LENS PLACEMENT (IOC) LEFT;  Surgeon: Myrna Adine Anes, MD;  Location: [facility];  Service: Ophthalmology;  Laterality: Left;  5.90 0:37.7   CATARACT EXTRACTION W/PHACO Right 02/05/2021   Procedure: CATARACT EXTRACTION PHACO AND INTRAOCULAR LENS PLACEMENT (IOC) RIGHT 4.33 00:34.6;  Surgeon: Myrna Adine Anes, MD;  Location: [facility];  Service: Ophthalmology;  Laterality: Right;   CHOLECYSTECTOMY  2010   INTRACRANIAL ANEURYSM REPAIR  05/29/2016   MCP replacements  2001   left   TUBAL LIGATION     Family History  Problem Relation Age of Onset   Heart disease Mother    Diabetes Mother    Hypercholesterolemia Mother     Heart disease Father    Heart disease Brother        MI age 9   Hypercholesterolemia Sister    Cancer Sister    Colon polyps Sister    Arthritis/Rheumatoid Paternal Aunt    Arthritis/Rheumatoid Paternal Uncle    Breast cancer Neg Hx    Social History   Socioeconomic History   Marital status: Widowed    Spouse name: Not on file   Number of children: 1   Years of education: Not on file   Highest education level: Not on file  Occupational History   Not on file  Tobacco Use   Smoking status: Former    Current packs/day: 0.00    Types: Cigarettes    Quit date: 12/30/1988    Years since quitting: 35.6   Smokeless tobacco: Never  Vaping Use   Vaping status: Never Used  Substance and Sexual Activity   Alcohol use: No    Alcohol/week: 0.0 standard drinks of alcohol   Drug use: No   Sexual activity: Yes  Other Topics Concern   Not on file  Social History Narrative   Enjoys dancing    Social Drivers of Health   Financial Resource Strain: Low Risk  (12/09/2023)   Overall Financial Resource Strain (CARDIA)    Difficulty of Paying Living Expenses: Not hard at all  Food Insecurity: No Food Insecurity (12/09/2023)   Hunger Vital Sign    Worried About Running Out of Food in the Last Year: Never true    Ran  Out of Food in the Last Year: Never true  Transportation Needs: No Transportation Needs (12/09/2023)   PRAPARE - Administrator, Civil Service (Medical): No    Lack of Transportation (Non-Medical): No  Physical Activity: Sufficiently Active (12/09/2023)   Exercise Vital Sign    Days of Exercise per Week: 7 days    Minutes of Exercise per Session: 50 min  Stress: No Stress Concern Present (12/09/2023)   [employer] of Occupational Health - Occupational Stress Questionnaire    Feeling of Stress : Not at all  Social Connections: Moderately Integrated (12/09/2023)   Social Connection and Isolation Panel    Frequency of Communication with Friends and  Family: More than three times a week    Frequency of Social Gatherings with Friends and Family: More than three times a week    Attends Religious Services: More than 4 times per year    Active Member of [employer] or Organizations: Yes    Attends Banker Meetings: More than 4 times per year    Marital Status: Widowed     Review of Systems  Constitutional:  Negative for appetite change and fever.  HENT:  Negative for congestion and sinus pressure.   Respiratory:  Negative for cough, chest tightness and shortness of breath.   Cardiovascular:  Negative for chest pain, palpitations and leg swelling.  Gastrointestinal:  Negative for abdominal pain, diarrhea, nausea and vomiting.  Genitourinary:  Negative for difficulty urinating and dysuria.  Musculoskeletal:  Negative for myalgias.       Arthritis stable.   Skin:  Negative for color change and rash.  Neurological:  Negative for dizziness and headaches.  Psychiatric/Behavioral:  Negative for agitation and dysphoric mood.        Objective:     BP 128/70   Pulse 86   Temp 98 F (36.7 C)   Resp 16   Ht 4' 11 (1.499 m)   Wt 94 lb 6.4 oz (42.8 kg)   SpO2 99%   BMI 19.07 kg/m  Wt Readings from Last 3 Encounters:  07/27/24 94 lb 6.4 oz (42.8 kg)  06/22/24 95 lb 12.8 oz (43.5 kg)  03/22/24 95 lb 6.4 oz (43.3 kg)    Physical Exam Vitals reviewed.  Constitutional:      General: She is not in acute distress.    Appearance: Normal appearance.  HENT:     Head: Normocephalic and atraumatic.     Right Ear: External ear normal.     Left Ear: External ear normal.     Mouth/Throat:     Pharynx: No oropharyngeal exudate or posterior oropharyngeal erythema.  Eyes:     General: No scleral icterus.       Right eye: No discharge.        Left eye: No discharge.     Conjunctiva/sclera: Conjunctivae normal.  Neck:     Thyroid : No thyromegaly.  Cardiovascular:     Rate and Rhythm: Normal rate and regular rhythm.  Pulmonary:      Effort: No respiratory distress.     Breath sounds: Normal breath sounds. No wheezing.  Abdominal:     General: Bowel sounds are normal.     Palpations: Abdomen is soft.     Tenderness: There is no abdominal tenderness.  Musculoskeletal:        General: No swelling or tenderness.     Cervical back: Neck supple. No tenderness.  Lymphadenopathy:     Cervical: No cervical adenopathy.  Skin:    Findings: No erythema or rash.  Neurological:     Mental Status: She is alert.  Psychiatric:        Mood and Affect: Mood normal.        Behavior: Behavior normal.         Outpatient Encounter Medications as of 07/27/2024  Medication Sig   abatacept  (ORENCIA ) 250 MG injection Once every month   amLODipine  (NORVASC ) 2.5 MG tablet TAKE 1 TABLET BY MOUTH EVERY DAY   aspirin 81 MG chewable tablet Chew 81 mg by mouth daily.   calcium  gluconate 650 MG tablet Take 650 mg by mouth daily.   carbamide peroxide (DEBROX) 6.5 % OTIC solution 5 drops in right ear q day.  Massage for approximately 5 minutes.   cholecalciferol (VITAMIN D ) 1000 UNITS tablet Take 1,000 Units by mouth daily.   cyanocobalamin  (VITAMIN B12) 1000 MCG/ML injection Inject 1 mL into the muscle once every 30 days.   ELDERBERRY PO Take by mouth daily.   folic acid (FOLVITE) 1 MG tablet Take 1 mg by mouth daily. (Except MTX days)   lovastatin  (MEVACOR ) 10 MG tablet Take 1 tablet (10 mg total) by mouth 3 (three) times a week.   magnesium oxide (MAG-OX) 400 MG tablet Take 400 mg by mouth daily.   methotrexate 50 MG/2ML injection INJECT 0.2MLS SUBCUTANEOUSLY WEEKLY DISPENSE 2 VIALS   NEEDLE, DISP, 25 G (B-D DISP NEEDLE 25GX1) 25G X 1 MISC Use as directed to administer b12 injections   pantoprazole  (PROTONIX ) 40 MG tablet TAKE 1 TABLET BY MOUTH EVERY DAY   SYRINGE-NEEDLE, DISP, 3 ML 25G X 1 3 ML MISC Use as directed for b12 injections   telmisartan  (MICARDIS ) 40 MG tablet TAKE 1 TABLET BY MOUTH EVERY DAY   Triamcinolone Acetonide  (NASACORT AQ NA) Place into the nose as needed.   valACYclovir  (VALTREX ) 1000 MG tablet TAKE 1 TABLET BY MOUTH EVERY DAY   No facility-administered encounter medications on file as of 07/27/2024.     Lab Results  Component Value Date   WBC 5.2 03/17/2024   HGB 12.2 03/17/2024   HCT 36.4 03/17/2024   PLT 197.0 03/17/2024   GLUCOSE 80 07/27/2024   CHOL 193 07/21/2024   TRIG 73.0 07/21/2024   HDL 61.90 07/21/2024   LDLDIRECT 167.8 12/20/2013   LDLCALC 117 (H) 07/21/2024   ALT 11 07/21/2024   AST 22 07/21/2024   NA 133 (L) 07/27/2024   K 4.4 07/27/2024   CL 99 07/27/2024   CREATININE 0.89 07/27/2024   BUN 16 07/27/2024   CO2 25 07/27/2024   TSH 4.19 10/10/2023   INR 1.1 (H) 12/06/2013   HGBA1C 6.0 07/21/2024    MM 3D SCREENING MAMMOGRAM BILATERAL BREAST Result Date: 09/09/2023 CLINICAL DATA:  Screening. EXAM: DIGITAL SCREENING BILATERAL MAMMOGRAM WITH TOMOSYNTHESIS AND CAD TECHNIQUE: Bilateral screening digital craniocaudal and mediolateral oblique mammograms were obtained. Bilateral screening digital breast tomosynthesis was performed. The images were evaluated with computer-aided detection. COMPARISON:  Previous exam(s). ACR Breast Density Category b: There are scattered areas of fibroglandular density. FINDINGS: There are no findings suspicious for malignancy. IMPRESSION: No mammographic evidence of malignancy. A result letter of this screening mammogram will be mailed directly to the patient. RECOMMENDATION: Screening mammogram in one year. (Code:SM-B-01Y) BI-RADS CATEGORY  1: Negative. Electronically Signed   By: Rosaline Collet M.D.   On: 09/09/2023 11:12       Assessment & Plan:  Hyponatremia Assessment & Plan: Has seen nephrology previously. Discussed  diet and importance of eating regular meals/ensure. Discussed decreasing alcohol intake. Recheck sodium today.   Orders: -     Basic metabolic panel with GFR  Aortic atherosclerosis (HCC) Assessment & Plan: Taking  statin as directed. Did not tolerate increased doses.  Reviewed labs.  Did not tolerate praluent .  Continue current medication regimen.  No changes. Follow.  Lab Results  Component Value Date   CHOL 193 07/21/2024   HDL 61.90 07/21/2024   LDLCALC 117 (H) 07/21/2024   LDLDIRECT 167.8 12/20/2013   TRIG 73.0 07/21/2024   CHOLHDL 3 07/21/2024      Bilateral carotid artery disease, unspecified type [facility]) Assessment & Plan: Saw [facility] 06/02/24  - /u carotid ultrasound. Duplex ultrasound <40% bilaterally. F/u in 24 months.    COPD with chronic bronchitis and emphysema (HCC) Assessment & Plan: Has seen Dr Tamea.  Breathing stable.    History of cerebral aneurysm  Assessment & Plan: S/p stenting.  Being followed at [facility]. On aspirin.   Last evaluated 05/2022.  MRA  06/12/22 - ok.  Recommended continuing aspirin.  Recommended f/u in 3 years.    Hypercholesteremia Assessment & Plan: Intolerant to praluent . Lovastatin  as directed. Tolerating.  Did not tolerate increased doses. Continue current dose.  Follow lipid panel and liver function tests.  No change in medication today.  Lab Results  Component Value Date   CHOL 193 07/21/2024   HDL 61.90 07/21/2024   LDLCALC 117 (H) 07/21/2024   LDLDIRECT 167.8 12/20/2013   TRIG 73.0 07/21/2024   CHOLHDL 3 07/21/2024      Hyperglycemia Assessment & Plan: Follow met b and A1c.  Lab Results  Component Value Date   HGBA1C 6.0 07/21/2024      Weight loss Assessment & Plan: Discussed weight loss. Discussed importance of eating regular meals. Ensure. Follow.    Stress Assessment & Plan: Overall appears to be handling things well. Follow    Rheumatoid arthritis, involving unspecified site, unspecified whether rheumatoid factor present [facility]) Assessment & Plan: On MTX and orencia .  Seeing rheumatology. Stable. Follow.    Lung nodules Assessment & Plan: Pulmonary f/u 06/2023 - f/u scan.  Saw Dr Isadora - 08/2023 - CT  stable.  Recommended f/u CT in 2 years.    Primary hypertension Assessment & Plan: Continue micardis  and amlodipine . Continue to spot check pressures.  Follow metabolic panel. Blood pressure as outlined. No changes in medication today.       Allena Hamilton, MD

## 2024-07-28 LAB — BASIC METABOLIC PANEL WITH GFR
BUN: 16 mg/dL (ref 6–23)
CO2: 25 meq/L (ref 19–32)
Calcium: 8.8 mg/dL (ref 8.4–10.5)
Chloride: 99 meq/L (ref 96–112)
Creatinine, Ser: 0.89 mg/dL (ref 0.40–1.20)
GFR: 61.58 mL/min (ref >60.00)
Glucose, Bld: 80 mg/dL (ref 70–99)
Potassium: 4.4 meq/L (ref 3.5–5.1)
Sodium: 133 meq/L — ABNORMAL LOW (ref 135–145)

## 2024-07-29 ENCOUNTER — Ambulatory Visit: Payer: Self-pay | Admitting: Internal Medicine

## 2024-08-01 ENCOUNTER — Encounter: Payer: Self-pay | Admitting: Internal Medicine

## 2024-08-01 NOTE — Assessment & Plan Note (Signed)
Pulmonary f/u 06/2023 - f/u scan.  Saw Dr Aundria Rud - 08/2023 - CT stable.  Recommended f/u CT in 2 years.

## 2024-08-01 NOTE — Assessment & Plan Note (Signed)
 Taking statin as directed. Did not tolerate increased doses.  Reviewed labs.  Did not tolerate praluent .  Continue current medication regimen.  No changes. Follow.  Lab Results  Component Value Date   CHOL 193 07/21/2024   HDL 61.90 07/21/2024   LDLCALC 117 (H) 07/21/2024   LDLDIRECT 167.8 12/20/2013   TRIG 73.0 07/21/2024   CHOLHDL 3 07/21/2024

## 2024-08-01 NOTE — Assessment & Plan Note (Signed)
 Follow met b and A1c.  Lab Results  Component Value Date   HGBA1C 6.0 07/21/2024

## 2024-08-01 NOTE — Assessment & Plan Note (Signed)
Has seen Dr Jayme Cloud.  Breathing stable.

## 2024-08-01 NOTE — Assessment & Plan Note (Signed)
 On MTX and orencia .  Seeing rheumatology. Stable. Follow.

## 2024-08-01 NOTE — Assessment & Plan Note (Signed)
 Continue micardis  and amlodipine . Continue to spot check pressures.  Follow metabolic panel. Blood pressure as outlined. No changes in medication today.

## 2024-08-01 NOTE — Assessment & Plan Note (Signed)
 Intolerant to praluent . Lovastatin  as directed. Tolerating.  Did not tolerate increased doses. Continue current dose.  Follow lipid panel and liver function tests.  No change in medication today.  Lab Results  Component Value Date   CHOL 193 07/21/2024   HDL 61.90 07/21/2024   LDLCALC 117 (H) 07/21/2024   LDLDIRECT 167.8 12/20/2013   TRIG 73.0 07/21/2024   CHOLHDL 3 07/21/2024

## 2024-08-01 NOTE — Assessment & Plan Note (Signed)
 S/p stenting.  Being followed at [facility]. On aspirin.   Last evaluated 05/2022.  MRA  06/12/22 - ok.  Recommended continuing aspirin.  Recommended f/u in 3 years.

## 2024-08-01 NOTE — Assessment & Plan Note (Signed)
 Discussed weight loss. Discussed importance of eating regular meals. Ensure. Follow.

## 2024-08-01 NOTE — Assessment & Plan Note (Signed)
 Saw [facility] 06/02/24  - /u carotid ultrasound. Duplex ultrasound <40% bilaterally. F/u in 24 months.

## 2024-08-01 NOTE — Assessment & Plan Note (Addendum)
 Has seen nephrology previously. Discussed diet and importance of eating regular meals/ensure. Discussed decreasing alcohol intake. Recheck sodium today.

## 2024-08-01 NOTE — Assessment & Plan Note (Signed)
 Overall appears to be handling things well.  Follow.  ?

## 2024-08-03 ENCOUNTER — Other Ambulatory Visit: Payer: Self-pay | Admitting: Internal Medicine

## 2024-08-17 ENCOUNTER — Other Ambulatory Visit: Payer: Self-pay | Admitting: Internal Medicine

## 2024-08-17 DIAGNOSIS — Z1231 Encounter for screening mammogram for malignant neoplasm of breast: Secondary | ICD-10-CM

## 2024-09-08 ENCOUNTER — Ambulatory Visit: Admitting: Podiatry

## 2024-09-08 DIAGNOSIS — L98491 Non-pressure chronic ulcer of skin of other sites limited to breakdown of skin: Secondary | ICD-10-CM | POA: Diagnosis not present

## 2024-09-08 DIAGNOSIS — D2371 Other benign neoplasm of skin of right lower limb, including hip: Secondary | ICD-10-CM | POA: Diagnosis not present

## 2024-09-08 DIAGNOSIS — M069 Rheumatoid arthritis, unspecified: Secondary | ICD-10-CM

## 2024-09-08 DIAGNOSIS — D2370 Other benign neoplasm of skin of unspecified lower limb, including hip: Secondary | ICD-10-CM

## 2024-09-08 NOTE — Progress Notes (Signed)
 Subjective:  Patient ID: Erica Little, female    DOB: [birth year],  MRN: [id number] HPI Chief Complaint  Patient presents with   Callouses    NP - painful callus lesions on both feet    78 y.o. female presents with the above complaint.   ROS: Denies fever chills nausea muscle aches pains calf pain back pain chest pain shortness of breath.  States that she has tried trimming the callus on her right foot several times and she feels that she may have gotten infected.  Past Medical History:  Diagnosis Date   Family history of adverse reaction to anesthesia    Mother - PONV   Family history of brain aneurysm    GERD (gastroesophageal reflux disease)    Hyperlipidemia    Hypertension    MRSA (methicillin resistant Staphylococcus aureus)    skin infections   Rheumatoid arthritis(714.0)    transaminitis on MTX, remicade rxn, s/p sulfasalazine, orencia    Ulcer disease    Past Surgical History:  Procedure Laterality Date   CATARACT EXTRACTION W/PHACO Left 01/22/2021   Procedure: CATARACT EXTRACTION PHACO AND INTRAOCULAR LENS PLACEMENT (IOC) LEFT;  Surgeon: Myrna Adine Anes, MD;  Location: [facility];  Service: Ophthalmology;  Laterality: Left;  5.90 0:37.7   CATARACT EXTRACTION W/PHACO Right 02/05/2021   Procedure: CATARACT EXTRACTION PHACO AND INTRAOCULAR LENS PLACEMENT (IOC) RIGHT 4.33 00:34.6;  Surgeon: Myrna Adine Anes, MD;  Location: [facility];  Service: Ophthalmology;  Laterality: Right;   CHOLECYSTECTOMY  2010   INTRACRANIAL ANEURYSM REPAIR  05/29/2016   MCP replacements  2001   left   TUBAL LIGATION      Current Outpatient Medications:    abatacept  (ORENCIA ) 250 MG injection, Once every month, Disp: , Rfl:    amLODipine  (NORVASC ) 2.5 MG tablet, TAKE 1 TABLET BY MOUTH EVERY DAY, Disp: 90 tablet, Rfl: 1   aspirin 81 MG chewable tablet, Chew 81 mg by mouth daily., Disp: , Rfl:    calcium  gluconate 650 MG tablet, Take 650 mg by mouth daily., Disp: ,  Rfl:    carbamide peroxide (DEBROX) 6.5 % OTIC solution, 5 drops in right ear q day.  Massage for approximately 5 minutes., Disp: 15 mL, Rfl: 0   cholecalciferol (VITAMIN D ) 1000 UNITS tablet, Take 1,000 Units by mouth daily., Disp: , Rfl:    cyanocobalamin  (VITAMIN B12) 1000 MCG/ML injection, Inject 1 mL into the muscle once every 30 days., Disp: 3 mL, Rfl: 3   ELDERBERRY PO, Take by mouth daily., Disp: , Rfl:    folic acid (FOLVITE) 1 MG tablet, Take 1 mg by mouth daily. (Except MTX days), Disp: , Rfl:    lovastatin  (MEVACOR ) 10 MG tablet, Take 1 tablet (10 mg total) by mouth 3 (three) times a week., Disp: 90 tablet, Rfl: 1   magnesium oxide (MAG-OX) 400 MG tablet, Take 400 mg by mouth daily., Disp: , Rfl:    methotrexate 50 MG/2ML injection, INJECT 0.2MLS SUBCUTANEOUSLY WEEKLY DISPENSE 2 VIALS, Disp: , Rfl: 1   NEEDLE, DISP, 25 G (B-D DISP NEEDLE 25GX1) 25G X 1 MISC, Use as directed to administer b12 injections, Disp: 50 each, Rfl: 0   pantoprazole  (PROTONIX ) 40 MG tablet, TAKE 1 TABLET BY MOUTH EVERY DAY, Disp: 90 tablet, Rfl: 3   SYRINGE-NEEDLE, DISP, 3 ML 25G X 1 3 ML MISC, Use as directed for b12 injections, Disp: 50 each, Rfl: 0   telmisartan  (MICARDIS ) 40 MG tablet, TAKE 1 TABLET BY MOUTH EVERY DAY, Disp:  90 tablet, Rfl: 1   Triamcinolone Acetonide (NASACORT AQ NA), Place into the nose as needed., Disp: , Rfl:    valACYclovir  (VALTREX ) 1000 MG tablet, TAKE 1 TABLET BY MOUTH EVERY DAY, Disp: 90 tablet, Rfl: 1  Allergies  Allergen Reactions   Oxycodone Nausea Only   Remicade [Infliximab] Other (See Comments)    Lightheadedness   Simvastatin     Joint pain   Sulfa Antibiotics     Stomach ulcers   Zetia [Ezetimibe] Other (See Comments)    Muscle aches    Latex Rash    Tape tears skin   Tape Rash    tape tears skin    Review of Systems Objective:  There were no vitals filed for this visit.  General: Well developed, nourished, in no acute distress, alert and oriented  x3   Dermatological: Skin is warm, dry and supple bilateral. Nails x 10 are well maintained; remaining integument appears unremarkable at this time. There are no open sores, no preulcerative lesions, no rash or signs of infection present.  Reactive hyperkeratotic lesion plantar medial aspect of the hallux interphalangeal joint right foot.  The callus is measures approximately 1 cm in diameter is thick and does demonstrate some blood beneath the skin.  Once debrided today demonstrates necrotic blood and tissue that does not extend all the way to the fat level.  Though it does extend into the dermis.  No erythema cellulitis drainage or odor.  She also has benign skin lesions plantar aspect of the forefoot bilaterally most likely secondary to her rheumatoid forefoot and nodularity.  Vascular: Dorsalis Pedis artery and Posterior Tibial artery pedal pulses are 2/4 bilateral with immedate capillary fill time. Pedal hair growth present. No varicosities and no lower extremity edema present bilateral.   Neruologic: Grossly intact via light touch bilateral. Vibratory intact via tuning fork bilateral. Protective threshold with Semmes Wienstein monofilament intact to all pedal sites bilateral. Patellar and Achilles deep tendon reflexes 2+ bilateral. No Babinski or clonus noted bilateral.   Musculoskeletal: No gross boney pedal deformities bilateral. No pain, crepitus, or limitation noted with foot and ankle range of motion bilateral. Muscular strength 5/5 in all groups tested bilateral.  Severe rheumatoid arthropathy with fusion of the first metatarsophalangeal joint and hyperextension of the interphalangeal joints of the hallux bilaterally is leading to this ulceration plantar medial aspect of the hallux right.  Also resulting in other benign lesions to the plantar aspect of the forefoot bilateral.  Gait: Unassisted, Nonantalgic.    Radiographs:  None taken  Assessment & Plan:   Assessment: Rheumatoid  arthritis leading to ulceration with skin breakdown to the dermis interphalangeal joint right measuring centimeter in diameter.  Benign neoplasms skin lesions forefoot bilateral    Plan: Discussed etiology pathology conservative versus surgical therapies debrided wounds and lesions today for her.  Discussed how to take care of this wound as far as soaking Epsom salts and warm water otherwise keeping it dry and covered with a small amount of Neosporin and a Band-Aid.  Debrided all of the benign skin lesions with a chisel blade today after it was used sterilely on the ulcerative lesion.  Follow-up with her in 2 weeks.     Armentha Branagan T. [place], [place]

## 2024-09-14 ENCOUNTER — Ambulatory Visit
Admission: RE | Admit: 2024-09-14 | Discharge: 2024-09-14 | Disposition: A | Discharge status: Home / Self Care | Source: Ambulatory Visit | Attending: Internal Medicine | Admitting: Internal Medicine

## 2024-09-14 DIAGNOSIS — Z1231 Encounter for screening mammogram for malignant neoplasm of breast: Secondary | ICD-10-CM

## 2024-09-22 ENCOUNTER — Ambulatory Visit (INDEPENDENT_AMBULATORY_CARE_PROVIDER_SITE_OTHER): Admitting: Podiatry

## 2024-09-22 DIAGNOSIS — D2370 Other benign neoplasm of skin of unspecified lower limb, including hip: Secondary | ICD-10-CM

## 2024-09-22 DIAGNOSIS — D2371 Other benign neoplasm of skin of right lower limb, including hip: Secondary | ICD-10-CM

## 2024-09-22 NOTE — Progress Notes (Signed)
 She presents today chief complaint of painful lesions plantar aspect of her left foot.  She states that everything else is doing fine.  Objective: No open lesions or wounds.  She has a callus subfirst and subfifth left foot.  No open lesions are noted.  Assessment: Benign skin lesions plantar aspect of the left foot.  Plan: Follow-up with her on an as-needed basis debrided benign skin lesions bilaterally in place silicone padding.

## 2024-11-24 ENCOUNTER — Other Ambulatory Visit: Payer: Self-pay | Admitting: *Deleted

## 2024-11-24 DIAGNOSIS — I10 Essential (primary) hypertension: Secondary | ICD-10-CM

## 2024-11-24 DIAGNOSIS — R739 Hyperglycemia, unspecified: Secondary | ICD-10-CM

## 2024-11-24 DIAGNOSIS — E7800 Pure hypercholesterolemia, unspecified: Secondary | ICD-10-CM

## 2024-11-24 DIAGNOSIS — E871 Hypo-osmolality and hyponatremia: Secondary | ICD-10-CM

## 2024-12-02 ENCOUNTER — Other Ambulatory Visit

## 2024-12-02 DIAGNOSIS — E871 Hypo-osmolality and hyponatremia: Secondary | ICD-10-CM | POA: Diagnosis not present

## 2024-12-02 DIAGNOSIS — E7800 Pure hypercholesterolemia, unspecified: Secondary | ICD-10-CM | POA: Diagnosis not present

## 2024-12-02 DIAGNOSIS — I10 Essential (primary) hypertension: Secondary | ICD-10-CM | POA: Diagnosis not present

## 2024-12-02 DIAGNOSIS — R739 Hyperglycemia, unspecified: Secondary | ICD-10-CM

## 2024-12-02 LAB — BASIC METABOLIC PANEL WITH GFR
BUN: 14 mg/dL (ref 6–23)
CO2: 26 meq/L (ref 19–32)
Calcium: 9.2 mg/dL (ref 8.4–10.5)
Chloride: 97 meq/L (ref 96–112)
Creatinine, Ser: 0.86 mg/dL (ref 0.40–1.20)
GFR: 64.01 mL/min (ref >60.00)
Glucose, Bld: 86 mg/dL (ref 70–99)
Potassium: 4.3 meq/L (ref 3.5–5.1)
Sodium: 132 meq/L — ABNORMAL LOW (ref 135–145)

## 2024-12-02 LAB — HEMOGLOBIN A1C: Hgb A1c MFr Bld: 5.5 % (ref 4.6–6.5)

## 2024-12-02 LAB — CBC WITH DIFFERENTIAL/PLATELET
Basophils Absolute: 0 K/uL (ref 0.0–0.1)
Basophils Relative: 0.8 % (ref 0.0–3.0)
Eosinophils Absolute: 0.3 K/uL (ref 0.0–0.7)
Eosinophils Relative: 4.9 % (ref 0.0–5.0)
HCT: 37.6 % (ref 36.0–46.0)
Hemoglobin: 12.4 g/dL (ref 12.0–15.0)
Lymphocytes Relative: 28.3 % (ref 12.0–46.0)
Lymphs Abs: 1.6 K/uL (ref 0.7–4.0)
MCHC: 33 g/dL (ref 30.0–36.0)
MCV: 89.2 fl (ref 78.0–100.0)
Monocytes Absolute: 0.5 K/uL (ref 0.1–1.0)
Monocytes Relative: 8.3 % (ref 3.0–12.0)
Neutro Abs: 3.2 K/uL (ref 1.4–7.7)
Neutrophils Relative %: 57.7 % (ref 43.0–77.0)
Platelets: 197 K/uL (ref 150.0–400.0)
RBC: 4.22 Mil/uL (ref 3.87–5.11)
RDW: 15 % (ref 11.5–15.5)
WBC: 5.6 K/uL (ref 4.0–10.5)

## 2024-12-02 LAB — HEPATIC FUNCTION PANEL
ALT: 11 U/L (ref 0–35)
AST: 21 U/L (ref 0–37)
Albumin: 4.3 g/dL (ref 3.5–5.2)
Alkaline Phosphatase: 97 U/L (ref 39–117)
Bilirubin, Direct: 0.2 mg/dL (ref 0.0–0.3)
Total Bilirubin: 1 mg/dL (ref 0.2–1.2)
Total Protein: 6.6 g/dL (ref 6.0–8.3)

## 2024-12-02 LAB — LIPID PANEL
Cholesterol: 203 mg/dL — ABNORMAL HIGH (ref 0–200)
HDL: 53.9 mg/dL (ref >39.00)
LDL Cholesterol: 133 mg/dL — ABNORMAL HIGH (ref 0–99)
NonHDL: 148.6
Total CHOL/HDL Ratio: 4
Triglycerides: 77 mg/dL (ref 0.0–149.0)
VLDL: 15.4 mg/dL (ref 0.0–40.0)

## 2024-12-03 ENCOUNTER — Ambulatory Visit: Payer: Self-pay | Admitting: Internal Medicine

## 2024-12-06 ENCOUNTER — Ambulatory Visit: Admitting: Podiatry

## 2024-12-06 DIAGNOSIS — Z91198 Patient's noncompliance with other medical treatment and regimen for other reason: Secondary | ICD-10-CM

## 2024-12-06 NOTE — Progress Notes (Signed)
 1. Failure to attend appointment with reason given    Patient rescheduled. Had another appointment in [place] today.

## 2024-12-09 ENCOUNTER — Encounter: Payer: Self-pay | Admitting: Internal Medicine

## 2024-12-09 ENCOUNTER — Ambulatory Visit: Admitting: Internal Medicine

## 2024-12-09 VITALS — BP 112/70 | HR 70 | Temp 98.5°F | Ht 59.0 in | Wt 94.4 lb

## 2024-12-09 DIAGNOSIS — F439 Reaction to severe stress, unspecified: Secondary | ICD-10-CM

## 2024-12-09 DIAGNOSIS — R739 Hyperglycemia, unspecified: Secondary | ICD-10-CM

## 2024-12-09 DIAGNOSIS — J439 Emphysema, unspecified: Secondary | ICD-10-CM

## 2024-12-09 DIAGNOSIS — M069 Rheumatoid arthritis, unspecified: Secondary | ICD-10-CM

## 2024-12-09 DIAGNOSIS — I779 Disorder of arteries and arterioles, unspecified: Secondary | ICD-10-CM

## 2024-12-09 DIAGNOSIS — J4489 Other specified chronic obstructive pulmonary disease: Secondary | ICD-10-CM | POA: Diagnosis not present

## 2024-12-09 DIAGNOSIS — Z0000 Encounter for general adult medical examination without abnormal findings: Secondary | ICD-10-CM

## 2024-12-09 DIAGNOSIS — E7800 Pure hypercholesterolemia, unspecified: Secondary | ICD-10-CM

## 2024-12-09 DIAGNOSIS — R0982 Postnasal drip: Secondary | ICD-10-CM | POA: Diagnosis not present

## 2024-12-09 DIAGNOSIS — I10 Essential (primary) hypertension: Secondary | ICD-10-CM

## 2024-12-09 DIAGNOSIS — E871 Hypo-osmolality and hyponatremia: Secondary | ICD-10-CM | POA: Diagnosis not present

## 2024-12-09 DIAGNOSIS — R918 Other nonspecific abnormal finding of lung field: Secondary | ICD-10-CM | POA: Diagnosis not present

## 2024-12-09 MED ORDER — AZELASTINE HCL 0.1 % NA SOLN: 1.0000 | Freq: Two times a day (BID) | NASAL | 1 refills | Status: DC

## 2024-12-09 NOTE — Assessment & Plan Note (Signed)
 Physical today 12/09/24.  Mammogram 09/14/24  - Birads I.  Colonoscopy 2015.

## 2024-12-09 NOTE — Progress Notes (Unsigned)
 Subjective:    Patient ID: Erica Little, female    DOB: 1946/03/13, 78 y.o.   MRN: [id number]  Patient here for  Chief Complaint  Patient presents with   Medical Management of Chronic Issues   Annual Exam    HPI Here for a physical exam. Has been seeing podiatry - painful lesions - plantar aspect of left foot. Debrided. Is followed by rheumatology. On orencia  and methotrexate.    Past Medical History:  Diagnosis Date   Family history of adverse reaction to anesthesia    Mother - PONV   Family history of brain aneurysm    GERD (gastroesophageal reflux disease)    Hyperlipidemia    Hypertension    MRSA (methicillin resistant Staphylococcus aureus)    skin infections   Rheumatoid arthritis(714.0)    transaminitis on MTX, remicade rxn, s/p sulfasalazine, orencia    Ulcer disease    Past Surgical History:  Procedure Laterality Date   CATARACT EXTRACTION W/PHACO Left 01/22/2021   Procedure: CATARACT EXTRACTION PHACO AND INTRAOCULAR LENS PLACEMENT (IOC) LEFT;  Surgeon: Myrna Adine Anes, MD;  Location: [facility];  Service: Ophthalmology;  Laterality: Left;  5.90 0:37.7   CATARACT EXTRACTION W/PHACO Right 02/05/2021   Procedure: CATARACT EXTRACTION PHACO AND INTRAOCULAR LENS PLACEMENT (IOC) RIGHT 4.33 00:34.6;  Surgeon: Myrna Adine Anes, MD;  Location: [facility];  Service: Ophthalmology;  Laterality: Right;   CHOLECYSTECTOMY  2010   INTRACRANIAL ANEURYSM REPAIR  05/29/2016   MCP replacements  2001   left   TUBAL LIGATION     Family History  Problem Relation Age of Onset   Heart disease Mother    Diabetes Mother    Hypercholesterolemia Mother    Heart disease Father    Heart disease Brother        MI age 17   Hypercholesterolemia Sister    Cancer Sister    Colon polyps Sister    Arthritis/Rheumatoid Paternal Aunt    Arthritis/Rheumatoid Paternal Uncle    Breast cancer Neg Hx    Social History   Socioeconomic  History   Marital status: Widowed    Spouse name: Not on file   Number of children: 1   Years of education: Not on file   Highest education level: Not on file  Occupational History   Not on file  Tobacco Use   Smoking status: Former    Current packs/day: 0.00    Types: Cigarettes    Quit date: 12/30/1988    Years since quitting: 35.9   Smokeless tobacco: Never  Vaping Use   Vaping status: Never Used  Substance and Sexual Activity   Alcohol use: No    Alcohol/week: 0.0 standard drinks of alcohol   Drug use: No   Sexual activity: Yes  Other Topics Concern   Not on file  Social History Narrative   Enjoys dancing    Social Drivers of Health   Tobacco Use: Medium Risk (12/09/2024)   Patient History    Smoking Tobacco Use: Former    Smokeless Tobacco Use: Never    Passive Exposure: Not on Actuary Strain: Low Risk (12/09/2023)   Overall Financial Resource Strain (CARDIA)    Difficulty of Paying Living Expenses: Not hard at all  Food Insecurity: No Food Insecurity (12/09/2023)   Hunger Vital Sign    Worried About Running Out of Food in the Last Year: Never true    Ran Out of Food in the Last Year: Never true  Transportation Needs: No Transportation Needs (12/09/2023)   PRAPARE - Administrator, Civil Service (Medical): No    Lack of Transportation (Non-Medical): No  Physical Activity: Sufficiently Active (12/09/2023)   Exercise Vital Sign    Days of Exercise per Week: 7 days    Minutes of Exercise per Session: 50 min  Stress: No Stress Concern Present (12/09/2023)   [employer] of Occupational Health - Occupational Stress Questionnaire    Feeling of Stress : Not at all  Social Connections: Moderately Integrated (12/09/2023)   Social Connection and Isolation Panel    Frequency of Communication with Friends and Family: More than three times a week    Frequency of Social Gatherings with Friends and Family: More than  three times a week    Attends Religious Services: More than 4 times per year    Active Member of [employer] or Organizations: Yes    Attends Banker Meetings: More than 4 times per year    Marital Status: Widowed  Depression (PHQ2-9): Low Risk (12/09/2024)   Depression (PHQ2-9)    PHQ-2 Score: 0  Alcohol Screen: Low Risk (12/09/2023)   Alcohol Screen    Last Alcohol Screening Score (AUDIT): 1  Housing: Unknown (09/23/2024)   Received from [facility]   Epic    Unable to Pay for Housing in the Last Year: Not on file    Number of Times Moved in the Last Year: Not on file    At any time in the past 12 months, were you homeless or living in a shelter (including now)?: No  Utilities: Not At Risk (12/09/2023)   AHC Utilities    Threatened with loss of utilities: No  Health Literacy: Adequate Health Literacy (12/09/2023)   B1300 Health Literacy    Frequency of need for help with medical instructions: Never     Review of Systems     Objective:     BP 112/70   Pulse 70   Temp 98.5 F (36.9 C) (Oral)   Ht 4' 11 (1.499 m)   Wt 94 lb 6.4 oz (42.8 kg)   SpO2 99%   BMI 19.07 kg/m  Wt Readings from Last 3 Encounters:  12/09/24 94 lb 6.4 oz (42.8 kg)  07/27/24 94 lb 6.4 oz (42.8 kg)  06/22/24 95 lb 12.8 oz (43.5 kg)    Physical Exam  {Perform Simple Foot Exam  Perform Detailed exam:1} {Insert foot Exam (Optional):30965}   Outpatient Encounter Medications as of 12/09/2024  Medication Sig   abatacept  (ORENCIA ) 250 MG injection Once every month   amLODipine  (NORVASC ) 2.5 MG tablet TAKE 1 TABLET BY MOUTH EVERY DAY   aspirin 81 MG chewable tablet Chew 81 mg by mouth daily.   calcium  gluconate 650 MG tablet Take 650 mg by mouth daily.   carbamide peroxide (DEBROX) 6.5 % OTIC solution 5 drops in right ear q day.  Massage for approximately 5 minutes.   cholecalciferol (VITAMIN D ) 1000 UNITS tablet Take 1,000 Units by mouth daily.    cyanocobalamin  (VITAMIN B12) 1000 MCG/ML injection Inject 1 mL into the muscle once every 30 days.   ELDERBERRY PO Take by mouth daily.   folic acid (FOLVITE) 1 MG tablet Take 1 mg by mouth daily. (Except MTX days)   lovastatin  (MEVACOR ) 10 MG tablet Take 1 tablet (10 mg total) by mouth 3 (three) times a week.   magnesium oxide (MAG-OX) 400 MG tablet Take 400 mg by mouth daily.  methotrexate 50 MG/2ML injection INJECT 0.2MLS SUBCUTANEOUSLY WEEKLY DISPENSE 2 VIALS   NEEDLE, DISP, 25 G (B-D DISP NEEDLE 25GX1) 25G X 1 MISC Use as directed to administer b12 injections   pantoprazole  (PROTONIX ) 40 MG tablet TAKE 1 TABLET BY MOUTH EVERY DAY   SYRINGE-NEEDLE, DISP, 3 ML 25G X 1 3 ML MISC Use as directed for b12 injections   telmisartan  (MICARDIS ) 40 MG tablet TAKE 1 TABLET BY MOUTH EVERY DAY   Triamcinolone Acetonide (NASACORT AQ NA) Place into the nose as needed.   valACYclovir  (VALTREX ) 1000 MG tablet TAKE 1 TABLET BY MOUTH EVERY DAY   No facility-administered encounter medications on file as of 12/09/2024.     Lab Results  Component Value Date   WBC 5.6 12/02/2024   HGB 12.4 12/02/2024   HCT 37.6 12/02/2024   PLT 197.0 12/02/2024   GLUCOSE 86 12/02/2024   CHOL 203 (H) 12/02/2024   TRIG 77.0 12/02/2024   HDL 53.90 12/02/2024   LDLDIRECT 167.8 12/20/2013   LDLCALC 133 (H) 12/02/2024   ALT 11 12/02/2024   AST 21 12/02/2024   NA 132 (L) 12/02/2024   K 4.3 12/02/2024   CL 97 12/02/2024   CREATININE 0.86 12/02/2024   BUN 14 12/02/2024   CO2 26 12/02/2024   TSH 4.19 10/10/2023   INR 1.1 (H) 12/06/2013   HGBA1C 5.5 12/02/2024    MM 3D SCREENING MAMMOGRAM BILATERAL BREAST Result Date: 09/15/2024 CLINICAL DATA:  Screening. EXAM: DIGITAL SCREENING BILATERAL MAMMOGRAM WITH TOMOSYNTHESIS AND CAD TECHNIQUE: Bilateral screening digital craniocaudal and mediolateral oblique mammograms were obtained. Bilateral screening digital breast tomosynthesis was performed. The images were  evaluated with computer-aided detection. COMPARISON:  Previous exam(s). ACR Breast Density Category b: There are scattered areas of fibroglandular density. FINDINGS: There are no findings suspicious for malignancy. IMPRESSION: No mammographic evidence of malignancy. A result letter of this screening mammogram will be mailed directly to the patient. RECOMMENDATION: Screening mammogram in one year. (Code:[id number]-01Y) BI-RADS CATEGORY  1: Negative. Electronically Signed   By: Curtistine Noble   On: 09/15/2024 12:46       Assessment & Plan:  Health care maintenance Assessment & Plan: Physical today 12/09/24.  Mammogram 09/14/24  - Birads I.  Colonoscopy 2015.   Hyponatremia  Hypercholesteremia  Hyperglycemia  Primary hypertension     Allena Hamilton, MD

## 2024-12-10 ENCOUNTER — Ambulatory Visit: Payer: Medicare HMO

## 2024-12-10 DIAGNOSIS — Z0000 Encounter for general adult medical examination without abnormal findings: Secondary | ICD-10-CM | POA: Diagnosis not present

## 2024-12-10 NOTE — Patient Instructions (Addendum)
 Erica Little,  Thank you for taking the time for your Medicare Wellness Visit. I appreciate your continued commitment to your health goals. Please review the care plan we discussed, and feel free to reach out if I can assist you further.  Please note that Annual Wellness Visits do not include a physical exam. Some assessments may be limited, especially if the visit was conducted virtually. If needed, we may recommend an in-person follow-up with your provider. Next appt 12/12/2025 at 8:10.  Ongoing Care Seeing your primary care provider every 3 to 6 months helps us  monitor your health and provide consistent, personalized care.   Referrals If a referral was made during today's visit and you haven't received any updates within two weeks, please contact the referred provider directly to check on the status.  Recommended Screenings:  Health Maintenance  Topic Date Due   Hepatitis C Screening  Never done   Zoster (Shingles) Vaccine (1 of 2) Never done   Colon Cancer Screening  09/24/2019   COVID-19 Vaccine (4 - 2025-26 season) 08/30/2024   Medicare Annual Wellness Visit  12/08/2024   Flu Shot  03/29/2025*   Breast Cancer Screening  09/14/2025   Pneumococcal Vaccine for age over 3  Completed   Osteoporosis screening with Bone Density Scan  Completed   Meningitis B Vaccine  Aged Out   DTaP/Tdap/Td vaccine  Discontinued  *Topic was postponed. The date shown is not the original due date.       12/10/2024    8:11 AM  Advanced Directives  Does Patient Have a Medical Advance Directive? Yes  Type of Estate Agent of [place];Living will  Does patient want to make changes to medical advance directive? No - Guardian declined  Copy of Healthcare Power of Attorney in Chart? No - copy requested    Vision: Annual vision screenings are recommended for early detection of glaucoma, cataracts, and diabetic retinopathy. These exams can also reveal signs of chronic conditions such as  diabetes and high blood pressure.  Dental: Annual dental screenings help detect early signs of oral cancer, gum disease, and other conditions linked to overall health, including heart disease and diabetes.  Please see the attached documents for additional preventive care recommendations.

## 2024-12-10 NOTE — Progress Notes (Signed)
 Chief Complaint  Patient presents with   Medicare Wellness     Subjective:   Erica Little is a 78 y.o. female who presents for a Medicare Annual Wellness Visit.  Visit info / Clinical Intake: Medicare Wellness Visit Type:: Subsequent Annual Wellness Visit Persons participating in visit and providing information:: patient Medicare Wellness Visit Mode:: Telephone If telephone:: video declined Since this visit was completed virtually, some vitals may be partially provided or unavailable. Missing vitals are due to the limitations of the virtual format.: Unable to obtain vitals - no equipment If Telephone or Video please confirm:: I connected with patient using audio/video enable telemedicine. I verified patient identity with two identifiers, discussed telehealth limitations, and patient agreed to proceed. Patient Location:: Home Provider Location:: Home Interpreter Needed?: No Pre-visit prep was completed: yes AWV questionnaire completed by patient prior to visit?: no Living arrangements:: (!) lives alone Patient's Overall Health Status Rating: excellent Typical amount of pain: some Does pain affect daily life?: no Are you currently prescribed opioids?: no  Dietary Habits and Nutritional Risks How many meals a day?: (!) 1 Eats fruit and vegetables daily?: yes Most meals are obtained by: preparing own meals; eating out; having others provide food In the last 2 weeks, have you had any of the following?: none Diabetic:: no  Functional Status Activities of Daily Living (to include ambulation/medication): Independent Ambulation: Independent Medication Administration: Independent Home Management (perform basic housework or laundry): Independent Manage your own finances?: yes Primary transportation is: driving Concerns about vision?: no *vision screening is required for WTM* Concerns about hearing?: no  Fall Screening Falls in the past year?: 0 Number of falls in past year:  0 Was there an injury with Fall?: 0 Fall Risk Category Calculator: 0 Patient Fall Risk Level: Low Fall Risk  Fall Risk Patient at Risk for Falls Due to: No Fall Risks Fall risk Follow up: Falls evaluation completed; Education provided; Falls prevention discussed  Home and Transportation Safety: All rugs have non-skid backing?: yes All stairs or steps have railings?: N/A, no stairs Grab bars in the bathtub or shower?: yes Have non-skid surface in bathtub or shower?: yes Good home lighting?: yes Regular seat belt use?: yes Hospital stays in the last year:: no  Cognitive Assessment Difficulty concentrating, remembering, or making decisions? : no Will 6CIT or Mini Cog be Completed: yes What year is it?: 0 points What month is it?: 0 points About what time is it?: 0 points Count backwards from 20 to 1: 0 points Say the months of the year in reverse: 0 points Repeat the address phrase from earlier: 2 points 6 CIT Score: 2 points  Advance Directives (For Healthcare) Does Patient Have a Medical Advance Directive?: Yes Does patient want to make changes to medical advance directive?: No - Guardian declined Type of Advance Directive: Healthcare Power of [place]; Living will Copy of Healthcare Power of Attorney in Chart?: No - copy requested Copy of Living Will in Chart?: No - copy requested  Reviewed/Updated  Reviewed/Updated: Reviewed All (Medical, Surgical, Family, Medications, Allergies, Care Teams, Patient Goals)    Allergies (verified) Oxycodone, Remicade [infliximab], Simvastatin, Sulfa antibiotics, Zetia [ezetimibe], Latex, and Tape   Current Medications (verified) Outpatient Encounter Medications as of 12/10/2024  Medication Sig   abatacept  (ORENCIA ) 250 MG injection Once every month   aspirin 81 MG chewable tablet Chew 81 mg by mouth daily.   calcium  gluconate 650 MG tablet Take 650 mg by mouth daily.   carbamide peroxide (DEBROX) 6.5 %  OTIC solution 5 drops in right  ear q day.  Massage for approximately 5 minutes.   cholecalciferol (VITAMIN D ) 1000 UNITS tablet Take 1,000 Units by mouth daily.   cyanocobalamin  (VITAMIN B12) 1000 MCG/ML injection Inject 1 mL into the muscle once every 30 days.   ELDERBERRY PO Take by mouth daily.   folic acid (FOLVITE) 1 MG tablet Take 1 mg by mouth daily. (Except MTX days)   lovastatin  (MEVACOR ) 10 MG tablet Take 1 tablet (10 mg total) by mouth 3 (three) times a week.   magnesium oxide (MAG-OX) 400 MG tablet Take 400 mg by mouth daily.   methotrexate 50 MG/2ML injection INJECT 0.2MLS SUBCUTANEOUSLY WEEKLY DISPENSE 2 VIALS   NEEDLE, DISP, 25 G (B-D DISP NEEDLE 25GX1) 25G X 1 MISC Use as directed to administer b12 injections   pantoprazole  (PROTONIX ) 40 MG tablet TAKE 1 TABLET BY MOUTH EVERY DAY   SYRINGE-NEEDLE, DISP, 3 ML 25G X 1 3 ML MISC Use as directed for b12 injections   Triamcinolone Acetonide (NASACORT AQ NA) Place into the nose as needed.   valACYclovir  (VALTREX ) 1000 MG tablet TAKE 1 TABLET BY MOUTH EVERY DAY   amLODipine  (NORVASC ) 2.5 MG tablet Take 1 tablet (2.5 mg total) by mouth daily.   azelastine (ASTELIN) 0.1 % nasal spray Place 1 spray into both nostrils 2 (two) times daily. Use in each nostril as directed   telmisartan  (MICARDIS ) 40 MG tablet Take 1 tablet (40 mg total) by mouth daily.   No facility-administered encounter medications on file as of 12/10/2024.    History: Past Medical History:  Diagnosis Date   Family history of adverse reaction to anesthesia    Mother - PONV   Family history of brain aneurysm    GERD (gastroesophageal reflux disease)    Hyperlipidemia    Hypertension    MRSA (methicillin resistant Staphylococcus aureus)    skin infections   Rheumatoid arthritis(714.0)    transaminitis on MTX, remicade rxn, s/p sulfasalazine, orencia    Ulcer disease    Past Surgical History:  Procedure Laterality Date   CATARACT EXTRACTION W/PHACO Left 01/22/2021   Procedure: CATARACT  EXTRACTION PHACO AND INTRAOCULAR LENS PLACEMENT (IOC) LEFT;  Surgeon: Myrna Adine Anes, MD;  Location: [facility];  Service: Ophthalmology;  Laterality: Left;  5.90 0:37.7   CATARACT EXTRACTION W/PHACO Right 02/05/2021   Procedure: CATARACT EXTRACTION PHACO AND INTRAOCULAR LENS PLACEMENT (IOC) RIGHT 4.33 00:34.6;  Surgeon: Myrna Adine Anes, MD;  Location: [facility];  Service: Ophthalmology;  Laterality: Right;   CHOLECYSTECTOMY  2010   INTRACRANIAL ANEURYSM REPAIR  05/29/2016   MCP replacements  2001   left   TUBAL LIGATION     Family History  Problem Relation Age of Onset   Heart disease Mother    Diabetes Mother    Hypercholesterolemia Mother    Heart disease Father    Heart disease Brother        MI age 36   Hypercholesterolemia Sister    Cancer Sister    Colon polyps Sister    Arthritis/Rheumatoid Paternal Aunt    Arthritis/Rheumatoid Paternal Uncle    Breast cancer Neg Hx    Social History   Occupational History   Not on file  Tobacco Use   Smoking status: Former    Current packs/day: 0.00    Types: Cigarettes    Quit date: 12/30/1988    Years since quitting: 35.9   Smokeless tobacco: Never  Vaping Use   Vaping status:  Never Used  Substance and Sexual Activity   Alcohol use: No    Alcohol/week: 0.0 standard drinks of alcohol   Drug use: No   Sexual activity: Yes   Tobacco Counseling Counseling given: Not Answered  SDOH Screenings   Food Insecurity: No Food Insecurity (12/10/2024)  Housing: Low Risk (12/10/2024)  Transportation Needs: No Transportation Needs (12/10/2024)  Utilities: Not At Risk (12/10/2024)  Alcohol Screen: Low Risk (12/09/2023)  Depression (PHQ2-9): Low Risk (12/10/2024)  Financial Resource Strain: Low Risk (12/09/2023)  Physical Activity: Sufficiently Active (12/10/2024)  Social Connections: Moderately Integrated (12/10/2024)  Stress: No Stress Concern Present (12/10/2024)  Tobacco Use: Medium Risk (12/09/2024)   Health Literacy: Adequate Health Literacy (12/10/2024)   See flowsheets for full screening details  Depression Screen PHQ 2 & 9 Depression Scale- Over the past 2 weeks, how often have you been bothered by any of the following problems? Little interest or pleasure in doing things: 0 Feeling down, depressed, or hopeless (PHQ Adolescent also includes...irritable): 0 PHQ-2 Total Score: 0 Trouble falling or staying asleep, or sleeping too much: 0 Feeling tired or having little energy: 0 Poor appetite or overeating (PHQ Adolescent also includes...weight loss): 0 Feeling bad about yourself - or that you are a failure or have let yourself or your family down: 0 Trouble concentrating on things, such as reading the newspaper or watching television (PHQ Adolescent also includes...like school work): 0 Moving or speaking so slowly that other people could have noticed. Or the opposite - being so fidgety or restless that you have been moving around a lot more than usual: 0 Thoughts that you would be better off dead, or of hurting yourself in some way: 0 PHQ-9 Total Score: 0     Goals Addressed             This Visit's Progress    Working on increasing exercise, eat better and drink more water.               Objective:    There were no vitals filed for this visit. There is no height or weight on file to calculate BMI.  Hearing/Vision screen No results found. Immunizations and Health Maintenance Health Maintenance  Topic Date Due   Hepatitis C Screening  Never done   Medicare Annual Wellness (AWV)  12/08/2024   COVID-19 Vaccine (4 - 2025-26 season) 12/26/2024 (Originally 08/30/2024)   Zoster Vaccines- Shingrix (1 of 2) 03/10/2025 (Originally 12/21/1965)   Influenza Vaccine  03/29/2025 (Originally 07/30/2024)   Colonoscopy  12/10/2025 (Originally 09/24/2019)   Mammogram  09/14/2025   Pneumococcal Vaccine: 50+ Years  Completed   Bone Density Scan  Completed   Meningococcal B Vaccine   Aged Out   DTaP/Tdap/Td  Discontinued        Assessment/Plan:  This is a routine wellness examination for Erica Little.  Patient Care Team: Glendia Shad, MD as PCP - General (Internal Medicine)  I have personally reviewed and noted the following in the patients chart:   Medical and social history Use of alcohol, tobacco or illicit drugs  Current medications and supplements including opioid prescriptions. Functional ability and status Nutritional status Physical activity Advanced directives List of other physicians Hospitalizations, surgeries, and ER visits in previous 12 months Vitals Screenings to include cognitive, depression, and falls Referrals and appointments  No orders of the defined types were placed in this encounter.  In addition, I have reviewed and discussed with patient certain preventive protocols, quality metrics, and best practice recommendations. A written personalized  care plan for preventive services as well as general preventive health recommendations were provided to patient.   Arnette LOISE Hoots, CMA   12/10/2024   No follow-ups on file.  After Visit Summary: (Declined) Due to this being a telephonic visit, with patients personalized plan was offered to patient but patient Declined AVS at this time   Nurse Notes: Patient has declined a colonoscopy at this time but will reassess if any problems arrive. She declines all vaccines. She is very active and always on the go.

## 2024-12-16 ENCOUNTER — Ambulatory Visit: Admitting: Podiatry

## 2024-12-16 DIAGNOSIS — M2040 Other hammer toe(s) (acquired), unspecified foot: Secondary | ICD-10-CM | POA: Insufficient documentation

## 2024-12-16 DIAGNOSIS — Z9189 Other specified personal risk factors, not elsewhere classified: Secondary | ICD-10-CM

## 2024-12-16 DIAGNOSIS — M25649 Stiffness of unspecified hand, not elsewhere classified: Secondary | ICD-10-CM | POA: Insufficient documentation

## 2024-12-16 DIAGNOSIS — L84 Corns and callosities: Secondary | ICD-10-CM | POA: Diagnosis not present

## 2024-12-16 DIAGNOSIS — M25549 Pain in joints of unspecified hand: Secondary | ICD-10-CM | POA: Insufficient documentation

## 2024-12-16 DIAGNOSIS — M2020 Hallux rigidus, unspecified foot: Secondary | ICD-10-CM | POA: Insufficient documentation

## 2024-12-16 DIAGNOSIS — M2010 Hallux valgus (acquired), unspecified foot: Secondary | ICD-10-CM | POA: Insufficient documentation

## 2024-12-16 DIAGNOSIS — M069 Rheumatoid arthritis, unspecified: Secondary | ICD-10-CM

## 2024-12-16 NOTE — Progress Notes (Unsigned)
°  Subjective:  Patient ID: Erica Little, female    DOB: 01-08-1946,  MRN: [id number]  Erica Little presents to clinic today for {jgcomplaint:23593}  Chief Complaint  Patient presents with   Callouses    She has a callus subfirst and subfifth left foot   New problem(s): None. {jgcomplaint:23593}  PCP is Glendia Shad, MD.  Allergies[1]  Review of Systems: Negative except as noted in the HPI.  Objective: No changes noted in today's physical examination. There were no vitals filed for this visit. Erica Little is a pleasant 78 y.o. female {jgbodyhabitus:24098} AAO x 3.   Assessment/Plan: No diagnosis found.  No orders of the defined types were placed in this encounter.   None {Jgplan:23602::-Patient/POA to call should there be question/concern in the interim.}   Return in about 3 months (around 03/16/2025).  Delon LITTIE Merlin, DPM      [place] LOCATION: [address] [place], [place] [address]                   Office [phone]   [place] LOCATION: [address] [place], [place] [address] Office [phone]     [1]  Allergies Allergen Reactions   Oxycodone Nausea Only   Remicade [Infliximab] Other (See Comments)    Lightheadedness   Simvastatin     Joint pain   Sulfa Antibiotics     Stomach ulcers   Zetia [Ezetimibe] Other (See Comments)    Muscle aches    Latex Rash    Tape tears skin   Tape Rash    tape tears skin

## 2024-12-19 ENCOUNTER — Encounter: Payer: Self-pay | Admitting: Internal Medicine

## 2024-12-19 DIAGNOSIS — R0982 Postnasal drip: Secondary | ICD-10-CM | POA: Insufficient documentation

## 2024-12-19 NOTE — Assessment & Plan Note (Signed)
 Continue micardis  and amlodipine . Continue to spot check pressures.  Follow metabolic panel. Blood pressure as outlined. No changes in medication today.

## 2024-12-19 NOTE — Assessment & Plan Note (Signed)
 On MTX and orencia .  Seeing rheumatology. Overall stable.

## 2024-12-19 NOTE — Assessment & Plan Note (Signed)
 Saw [facility] 06/02/24  - /u carotid ultrasound. Duplex ultrasound <40% bilaterally. F/u in 24 months.

## 2024-12-19 NOTE — Assessment & Plan Note (Signed)
 Has tried allegra. Trial of astelin  nasal spray.

## 2024-12-19 NOTE — Assessment & Plan Note (Signed)
Pulmonary f/u 06/2023 - f/u scan.  Saw Dr Aundria Rud - 08/2023 - CT stable.  Recommended f/u CT in 2 years.

## 2024-12-19 NOTE — Assessment & Plan Note (Signed)
 Overall appears to be doing well.  Follow.

## 2024-12-19 NOTE — Assessment & Plan Note (Signed)
 Intolerant to praluent . Lovastatin  as directed. Tolerating.  Did not tolerate increased doses. Continue current dose.  Follow lipid panel and liver function tests.  No change today.  Lab Results  Component Value Date   CHOL 203 (H) 12/02/2024   HDL 53.90 12/02/2024   LDLCALC 133 (H) 12/02/2024   LDLDIRECT 167.8 12/20/2013   TRIG 77.0 12/02/2024   CHOLHDL 4 12/02/2024

## 2024-12-19 NOTE — Assessment & Plan Note (Signed)
 Has seen nephrology previously. Discussed diet and importance of eating regular meals/ensure. Have previously discussed decreasing alcohol intake. Sodium stable. Follow.

## 2024-12-19 NOTE — Assessment & Plan Note (Signed)
Has seen Dr Jayme Cloud.  Breathing stable.

## 2024-12-19 NOTE — Assessment & Plan Note (Signed)
 Follow met b and A1c.  Lab Results  Component Value Date   HGBA1C 5.5 12/02/2024

## 2024-12-30 ENCOUNTER — Other Ambulatory Visit: Payer: Self-pay | Admitting: Internal Medicine

## 2025-01-05 ENCOUNTER — Other Ambulatory Visit: Payer: Self-pay | Admitting: Internal Medicine

## 2025-01-06 NOTE — Telephone Encounter (Signed)
 Rx ok'd for astelin - 90 days

## 2025-03-17 ENCOUNTER — Ambulatory Visit: Admitting: Podiatry

## 2025-04-11 ENCOUNTER — Other Ambulatory Visit

## 2025-04-14 ENCOUNTER — Ambulatory Visit: Admitting: Internal Medicine

## 2025-12-12 ENCOUNTER — Ambulatory Visit
